# Patient Record
Sex: Male | Born: 1970 | Race: White | Hispanic: Yes | Marital: Married | Smoking: Never smoker
Health system: Southern US, Community
[De-identification: ages and names within clinical notes are randomized; demographics above are authoritative.]

## PROBLEM LIST (undated history)

## (undated) DIAGNOSIS — I10 Essential (primary) hypertension: Secondary | ICD-10-CM

## (undated) DIAGNOSIS — E669 Obesity, unspecified: Secondary | ICD-10-CM

## (undated) DIAGNOSIS — E785 Hyperlipidemia, unspecified: Secondary | ICD-10-CM

## (undated) DIAGNOSIS — E119 Type 2 diabetes mellitus without complications: Secondary | ICD-10-CM

## (undated) HISTORY — DX: Essential (primary) hypertension: I10

## (undated) HISTORY — PX: OTHER SURGICAL HISTORY: SHX169

## (undated) HISTORY — PX: NO PAST SURGERIES: SHX2092

## (undated) HISTORY — DX: Hyperlipidemia, unspecified: E78.5

---

## 2003-10-25 ENCOUNTER — Emergency Department (HOSPITAL_COMMUNITY): Admission: EM | Admit: 2003-10-25 | Discharge: 2003-10-25 | Payer: Self-pay | Admitting: Emergency Medicine

## 2017-05-12 ENCOUNTER — Emergency Department (HOSPITAL_COMMUNITY): Payer: Self-pay

## 2017-05-12 ENCOUNTER — Emergency Department (HOSPITAL_COMMUNITY)
Admission: EM | Admit: 2017-05-12 | Discharge: 2017-05-12 | Disposition: A | Discharge status: Home / Self Care | Payer: Self-pay | Attending: Emergency Medicine | Admitting: Emergency Medicine

## 2017-05-12 ENCOUNTER — Encounter (HOSPITAL_COMMUNITY): Payer: Self-pay | Admitting: *Deleted

## 2017-05-12 ENCOUNTER — Other Ambulatory Visit: Payer: Self-pay

## 2017-05-12 DIAGNOSIS — R0789 Other chest pain: Secondary | ICD-10-CM | POA: Insufficient documentation

## 2017-05-12 HISTORY — DX: Obesity, unspecified: E66.9

## 2017-05-12 MED ORDER — NAPROXEN 500 MG PO TABS: 500.0000 mg | ORAL_TABLET | Freq: Two times a day (BID) | ORAL | 0 refills | Status: DC

## 2017-05-12 NOTE — ED Provider Notes (Signed)
Patient placed in Quick Look pathway, seen and evaluated for chief complaint of left chest wall pain.  Pertinent H&P findings include mechanical injury with hugging. Felt pop. Mild SOB. No hx ACS. Based on initial evaluation, labs are not indicated and radiology studies are indicated.  Patient counseled on process, plan, and necessity for staying for completing the evaluation.    Audry PiliMohr, Maddy Graham, PA-C 05/12/17 1715    Margarita Grizzleay, Danielle, MD 05/15/17 [phone]

## 2017-05-12 NOTE — ED Notes (Signed)
ED Provider at bedside. 

## 2017-05-12 NOTE — Discharge Instructions (Signed)
Please read attached information regarding your condition. Take naproxen twice daily to help with pain and inflammation. Apply heating pad to affected area and stretch as tolerated. Return to ED for worsening symptoms, chest pain, trouble breathing, coughing or vomiting up blood.   Por favor lea la informacin adjunta sobre su condicin. Tome Lucent Technologiesnaproxeno dos veces al da para ayudar con el dolor y la inflamacin. Aplique almohadilla trmica al rea afectada y estrelo segn lo tolere Regrese a la ED para FedExempeorar los sntomas, dolor en el pecho, dificultad para Industrial/product designerrespirar, toser o Diplomatic Services operational officervomitar sangre.

## 2017-05-12 NOTE — ED Triage Notes (Signed)
Pt c/o L sided rib cage pain with deep inspirations onset x 2 wks after his friend hugged him, pt states, "I heard my ribs pop when he hugged me." pain worse with lifting, pt denies n/v/d, A&O x4

## 2017-05-12 NOTE — ED Notes (Addendum)
Pt native language is [place]. Spanish interpreter used for communication.

## 2017-05-12 NOTE — ED Notes (Signed)
Signature pad malfunction at time of departure. Pt verbalized understanding of instructions and prescription.

## 2017-05-12 NOTE — ED Provider Notes (Signed)
[facility]   CSN: [id number] Arrival date & time: 05/12/17  1640     History   Chief Complaint Chief Complaint  Patient presents with  . Chest Pain    HPI [facility] is a 47 y.o. male with no significant past medical history, who presents to ED for evaluation of left sided rib pain for the past 5 hours.  States that symptoms began after 1 of his friends hugged him with a lot of force and picked him up.  He felt a popping sensation on his left rib area and has been having intermittent sharp pains worse with movement since then.  Patient.  He just wants to make sure that he has not broken any ribs or bones otherwise.  He has not taken any medications prior to arrival to help with symptoms.  He denies any shortness of breath, hemoptysis, recent surgeries, recent prolonged travel, prior MI, DVT or PE, chest pain, vomiting, diaphoresis, abdominal pain or prior rib fractures.  HPI  Past Medical History:  Diagnosis Date  . Obese     There are no active problems to display for this patient.   History reviewed. No pertinent surgical history.     Home Medications    Prior to Admission medications   Medication Sig Start Date End Date Taking? Authorizing Provider  naproxen (NAPROSYN) 500 MG tablet Take 1 tablet (500 mg total) by mouth 2 (two) times daily. 05/12/17   Dietrich PatesKhatri, Lucien Budney, PA-C    Family History No family history on file.  Social History Social History   Tobacco Use  . Smoking status: Never Smoker  . Smokeless tobacco: Never Used  Substance Use Topics  . Alcohol use: No    Frequency: Never  . Drug use: No     Allergies   Patient has no known allergies.   Review of Systems Review of Systems  Constitutional: Negative for chills and fever.  Respiratory: Negative for cough, chest tightness, shortness of breath and wheezing.   Cardiovascular: Negative for chest pain, palpitations and leg  swelling.  Gastrointestinal: Negative for nausea and vomiting.  Musculoskeletal: Positive for arthralgias and myalgias. Negative for back pain, gait problem and joint swelling.  Skin: Negative for rash and wound.     Physical Exam Updated Vital Signs BP 124/85 (BP Location: Right Arm)   Pulse 85   Temp 99.2 F (37.3 C) (Oral)   Resp 20   SpO2 93%   Physical Exam  Constitutional: He appears well-developed and well-nourished. No distress.  Nontoxic-appearing and in no acute distress.  HENT:  Head: Normocephalic and atraumatic.  Eyes: Conjunctivae and EOM are normal. No scleral icterus.  Neck: Normal range of motion.  Cardiovascular: Normal rate, regular rhythm and normal heart sounds.  Pulmonary/Chest: Effort normal. No respiratory distress.  Left-sided rib pain worse with palpation.    Abdominal: Soft. Bowel sounds are normal. He exhibits no distension. There is no tenderness.  Neurological: He is alert.  Skin: No rash noted. He is not diaphoretic.  Psychiatric: He has a normal mood and affect.  Nursing note and vitals reviewed.    ED Treatments / Results  Labs (all labs ordered are listed, but only abnormal results are displayed) Labs Reviewed - No data to display  EKG  EKG Interpretation None       Radiology Dg Chest 2 View  Result Date: 05/12/2017 CLINICAL DATA:  Left-sided rib cage pain with deep inspirations for 2 weeks.  Heard ribs pop. Pain is worse with lifting. EXAM: CHEST  2 VIEW COMPARISON:  10/25/2003 FINDINGS: Mild cardiac enlargement. No edema or consolidation. No blunting of costophrenic angles. No pneumothorax. Visualized ribs are grossly intact. Mediastinal contours appear intact. Soft tissues are unremarkable. IMPRESSION: Cardiac enlargement.  No evidence of active pulmonary disease. Electronically Signed   By: Burman Nieves M.D.   On: 05/12/2017 18:03    Procedures Procedures (including critical care time)  Medications Ordered in  ED Medications - No data to display   Initial Impression / Assessment and Plan / ED Course  I have reviewed the triage vital signs and the nursing notes.  Pertinent labs & imaging results that were available during my care of the patient were reviewed by me and considered in my medical decision making (see chart for details).     Patient, with no significant past medical history, who presents to ED for evaluation of left-sided low that began just prior to arrival.  Symptoms began after 1 of his friends hugged him tight and picked him up.  States that he felt a pop in the area and has been having pain worse with palpation and movement since then.  He has no prior cardiac history, recent surgeries, recent prolonged travel, history of DVT PE, prior rib fractures, hematemesis. On physical exam he is overall well appearing. There is TTP as indicated in image above. I have low suspicion for cardiac or pulmonary cause of his symptoms.  Suspect musculoskeletal pain as this is worse with palpation and the patient is overall cardiac history.  His x-ray was negative.  Patient states that he just wants to make sure he did not break any ribs.  Will give supportive treatment with anti-inflammatories, heat therapy and stretching as advised him to follow-up with wellness center for further evaluation.  Patient appears stable for discharge at this time.  Strict return precautions given.  Final Clinical Impressions(s) / ED Diagnoses   Final diagnoses:  Chest wall pain    ED Discharge Orders        Ordered    naproxen (NAPROSYN) 500 MG tablet  2 times daily     05/12/17 2016     Portions of this note were generated with [employer]. Dictation errors may occur despite best attempts at proofreading.    Dietrich Pates, PA-C 05/12/17 2023    Mancel Bale, MD 05/13/17 [phone]

## 2018-10-09 ENCOUNTER — Other Ambulatory Visit: Payer: Self-pay

## 2018-10-09 ENCOUNTER — Encounter (HOSPITAL_COMMUNITY): Payer: Self-pay | Admitting: Emergency Medicine

## 2018-10-09 ENCOUNTER — Inpatient Hospital Stay (HOSPITAL_COMMUNITY)
Admission: EM | Admit: 2018-10-09 | Discharge: 2018-10-22 | DRG: 207 | Disposition: A | Discharge status: Home / Self Care | Payer: Medicaid Other | Attending: Internal Medicine | Admitting: Internal Medicine

## 2018-10-09 ENCOUNTER — Emergency Department (HOSPITAL_COMMUNITY): Payer: Medicaid Other

## 2018-10-09 DIAGNOSIS — R6521 Severe sepsis with septic shock: Secondary | ICD-10-CM | POA: Diagnosis present

## 2018-10-09 DIAGNOSIS — N179 Acute kidney failure, unspecified: Secondary | ICD-10-CM | POA: Diagnosis not present

## 2018-10-09 DIAGNOSIS — J988 Other specified respiratory disorders: Secondary | ICD-10-CM

## 2018-10-09 DIAGNOSIS — J1289 Other viral pneumonia: Secondary | ICD-10-CM | POA: Diagnosis not present

## 2018-10-09 DIAGNOSIS — I952 Hypotension due to drugs: Secondary | ICD-10-CM | POA: Diagnosis not present

## 2018-10-09 DIAGNOSIS — Z6841 Body Mass Index (BMI) 40.0 and over, adult: Secondary | ICD-10-CM | POA: Diagnosis not present

## 2018-10-09 DIAGNOSIS — T4275XA Adverse effect of unspecified antiepileptic and sedative-hypnotic drugs, initial encounter: Secondary | ICD-10-CM | POA: Diagnosis not present

## 2018-10-09 DIAGNOSIS — G4733 Obstructive sleep apnea (adult) (pediatric): Secondary | ICD-10-CM | POA: Diagnosis not present

## 2018-10-09 DIAGNOSIS — J9601 Acute respiratory failure with hypoxia: Secondary | ICD-10-CM | POA: Diagnosis not present

## 2018-10-09 DIAGNOSIS — K7290 Hepatic failure, unspecified without coma: Secondary | ICD-10-CM | POA: Diagnosis present

## 2018-10-09 DIAGNOSIS — Z452 Encounter for adjustment and management of vascular access device: Secondary | ICD-10-CM

## 2018-10-09 DIAGNOSIS — E870 Hyperosmolality and hypernatremia: Secondary | ICD-10-CM | POA: Diagnosis not present

## 2018-10-09 DIAGNOSIS — A4189 Other specified sepsis: Secondary | ICD-10-CM | POA: Diagnosis not present

## 2018-10-09 DIAGNOSIS — J9602 Acute respiratory failure with hypercapnia: Secondary | ICD-10-CM | POA: Diagnosis not present

## 2018-10-09 DIAGNOSIS — J80 Acute respiratory distress syndrome: Secondary | ICD-10-CM | POA: Diagnosis present

## 2018-10-09 DIAGNOSIS — J9691 Respiratory failure, unspecified with hypoxia: Secondary | ICD-10-CM

## 2018-10-09 DIAGNOSIS — E1165 Type 2 diabetes mellitus with hyperglycemia: Secondary | ICD-10-CM | POA: Diagnosis present

## 2018-10-09 DIAGNOSIS — E871 Hypo-osmolality and hyponatremia: Secondary | ICD-10-CM | POA: Diagnosis not present

## 2018-10-09 DIAGNOSIS — U071 COVID-19: Secondary | ICD-10-CM | POA: Diagnosis not present

## 2018-10-09 DIAGNOSIS — I10 Essential (primary) hypertension: Secondary | ICD-10-CM | POA: Diagnosis not present

## 2018-10-09 DIAGNOSIS — Z4659 Encounter for fitting and adjustment of other gastrointestinal appliance and device: Secondary | ICD-10-CM

## 2018-10-09 DIAGNOSIS — L89152 Pressure ulcer of sacral region, stage 2: Secondary | ICD-10-CM | POA: Diagnosis not present

## 2018-10-09 DIAGNOSIS — E1169 Type 2 diabetes mellitus with other specified complication: Secondary | ICD-10-CM

## 2018-10-09 DIAGNOSIS — E781 Pure hyperglyceridemia: Secondary | ICD-10-CM | POA: Diagnosis present

## 2018-10-09 DIAGNOSIS — L8990 Pressure ulcer of unspecified site, unspecified stage: Secondary | ICD-10-CM

## 2018-10-09 DIAGNOSIS — R0602 Shortness of breath: Secondary | ICD-10-CM | POA: Diagnosis present

## 2018-10-09 HISTORY — DX: Type 2 diabetes mellitus without complications: E11.9

## 2018-10-09 LAB — CBC WITH DIFFERENTIAL/PLATELET
Abs Immature Granulocytes: 0.56 10*3/uL — ABNORMAL HIGH (ref 0.00–0.07)
Abs Immature Granulocytes: 0.44 10*3/uL — ABNORMAL HIGH (ref 0.00–0.07)
Basophils Absolute: 0 10*3/uL (ref 0.0–0.1)
Basophils Absolute: 0.1 10*3/uL (ref 0.0–0.1)
Basophils Relative: 0 %
Basophils Relative: 0 %
Eosinophils Absolute: 0 10*3/uL (ref 0.0–0.5)
Eosinophils Absolute: 0 10*3/uL (ref 0.0–0.5)
Eosinophils Relative: 0 %
Eosinophils Relative: 0 %
HCT: 44.3 % (ref 39.0–52.0)
HCT: 43.3 % (ref 39.0–52.0)
Hemoglobin: 15.3 g/dL (ref 13.0–17.0)
Hemoglobin: 14.9 g/dL (ref 13.0–17.0)
Immature Granulocytes: 4 %
Immature Granulocytes: 3 %
Lymphocytes Relative: 13 %
Lymphocytes Relative: 13 %
Lymphs Abs: 1.7 10*3/uL (ref 0.7–4.0)
Lymphs Abs: 1.8 10*3/uL (ref 0.7–4.0)
MCH: 29.6 pg (ref 26.0–34.0)
MCH: 30 pg (ref 26.0–34.0)
MCHC: 34.5 g/dL (ref 30.0–36.0)
MCHC: 34.4 g/dL (ref 30.0–36.0)
MCV: 85.7 fL (ref 80.0–100.0)
MCV: 87.3 fL (ref 80.0–100.0)
Monocytes Absolute: 0.9 10*3/uL (ref 0.1–1.0)
Monocytes Absolute: 0.8 10*3/uL (ref 0.1–1.0)
Monocytes Relative: 7 %
Monocytes Relative: 6 %
Neutro Abs: 10 10*3/uL — ABNORMAL HIGH (ref 1.7–7.7)
Neutro Abs: 10.6 10*3/uL — ABNORMAL HIGH (ref 1.7–7.7)
Neutrophils Relative %: 76 %
Neutrophils Relative %: 78 %
Platelets: 249 10*3/uL (ref 150–400)
Platelets: 230 10*3/uL (ref 150–400)
RBC: 5.17 MIL/uL (ref 4.22–5.81)
RBC: 4.96 MIL/uL (ref 4.22–5.81)
RDW: 12.6 % (ref 11.5–15.5)
RDW: 12.8 % (ref 11.5–15.5)
WBC: 13.2 10*3/uL — ABNORMAL HIGH (ref 4.0–10.5)
WBC: 13.7 10*3/uL — ABNORMAL HIGH (ref 4.0–10.5)
nRBC: 0.9 % — ABNORMAL HIGH (ref 0.0–0.2)
nRBC: 0.7 % — ABNORMAL HIGH (ref 0.0–0.2)

## 2018-10-09 LAB — COMPREHENSIVE METABOLIC PANEL
ALT: 564 U/L — ABNORMAL HIGH (ref 0–44)
AST: 1510 U/L — ABNORMAL HIGH (ref 15–41)
Albumin: 2.4 g/dL — ABNORMAL LOW (ref 3.5–5.0)
Alkaline Phosphatase: 62 U/L (ref 38–126)
Anion gap: 16 — ABNORMAL HIGH (ref 5–15)
BUN: 34 mg/dL — ABNORMAL HIGH (ref 6–20)
CO2: 21 mmol/L — ABNORMAL LOW (ref 22–32)
Calcium: 7.5 mg/dL — ABNORMAL LOW (ref 8.9–10.3)
Chloride: 86 mmol/L — ABNORMAL LOW (ref 98–111)
Creatinine, Ser: 2 mg/dL — ABNORMAL HIGH (ref 0.61–1.24)
GFR calc Af Amer: 45 mL/min — ABNORMAL LOW (ref >60)
GFR calc non Af Amer: 39 mL/min — ABNORMAL LOW (ref >60)
Glucose, Bld: 319 mg/dL — ABNORMAL HIGH (ref 70–99)
Potassium: 4.6 mmol/L (ref 3.5–5.1)
Sodium: 123 mmol/L — ABNORMAL LOW (ref 135–145)
Total Bilirubin: 1.1 mg/dL (ref 0.3–1.2)
Total Protein: 6.9 g/dL (ref 6.5–8.1)

## 2018-10-09 LAB — POCT I-STAT 7, (LYTES, BLD GAS, ICA,H+H)
Acid-Base Excess: 1 mmol/L (ref 0.0–2.0)
Bicarbonate: 26.8 mmol/L (ref 20.0–28.0)
Calcium, Ion: 1.03 mmol/L — ABNORMAL LOW (ref 1.15–1.40)
HCT: 43 % (ref 39.0–52.0)
Hemoglobin: 14.6 g/dL (ref 13.0–17.0)
O2 Saturation: 89 %
Potassium: 4.5 mmol/L (ref 3.5–5.1)
Sodium: 122 mmol/L — ABNORMAL LOW (ref 135–145)
TCO2: 28 mmol/L (ref 22–32)
pCO2 arterial: 45.1 mmHg (ref 32.0–48.0)
pH, Arterial: 7.382 (ref 7.350–7.450)
pO2, Arterial: 58 mmHg — ABNORMAL LOW (ref 83.0–108.0)

## 2018-10-09 LAB — PHOSPHORUS: Phosphorus: 4.3 mg/dL (ref 2.5–4.6)

## 2018-10-09 LAB — MAGNESIUM: Magnesium: 2 mg/dL (ref 1.7–2.4)

## 2018-10-09 LAB — D-DIMER, QUANTITATIVE: D-Dimer, Quant: 5.21 ug/mL-FEU — ABNORMAL HIGH (ref 0.00–0.50)

## 2018-10-09 LAB — FERRITIN: Ferritin: 6924 ng/mL — ABNORMAL HIGH (ref 24–336)

## 2018-10-09 LAB — LACTIC ACID, PLASMA
Lactic Acid, Venous: 4.2 mmol/L (ref 0.5–1.9)
Lactic Acid, Venous: 2.5 mmol/L (ref 0.5–1.9)

## 2018-10-09 LAB — SARS CORONAVIRUS 2 BY RT PCR (HOSPITAL ORDER, PERFORMED IN ~~LOC~~ HOSPITAL LAB): SARS Coronavirus 2: POSITIVE — AB

## 2018-10-09 LAB — C-REACTIVE PROTEIN: CRP: 28.4 mg/dL — ABNORMAL HIGH (ref <1.0)

## 2018-10-09 MED ORDER — SODIUM CHLORIDE 0.9 % IV SOLN
1.0000 mg/kg | Freq: Once | INTRAVENOUS | Status: AC
  Administered 2018-10-10: 04:00:00 150 mg via INTRAVENOUS
  Filled 2018-10-09: qty 2.4

## 2018-10-09 MED ORDER — VANCOMYCIN HCL 10 G IV SOLR
1750.0000 mg | INTRAVENOUS | Status: DC
  Administered 2018-10-10: 20:00:00 1750 mg via INTRAVENOUS
  Filled 2018-10-09 (×2): qty 1750

## 2018-10-09 MED ORDER — ENOXAPARIN SODIUM 40 MG/0.4ML ~~LOC~~ SOLN: 40.0000 mg | SUBCUTANEOUS | Status: DC

## 2018-10-09 MED ORDER — FENTANYL CITRATE (PF) 100 MCG/2ML IJ SOLN: 50.0000 ug | Freq: Once | INTRAMUSCULAR | Status: DC

## 2018-10-09 MED ORDER — PROPOFOL 1000 MG/100ML IV EMUL
5.0000 ug/kg/min | INTRAVENOUS | Status: DC
  Administered 2018-10-09: 5 ug/kg/min via INTRAVENOUS
  Filled 2018-10-09: qty 100

## 2018-10-09 MED ORDER — MIDAZOLAM HCL 2 MG/2ML IJ SOLN
2.0000 mg | Freq: Once | INTRAMUSCULAR | Status: DC
  Filled 2018-10-09 (×2): qty 2

## 2018-10-09 MED ORDER — INSULIN ASPART 100 UNIT/ML ~~LOC~~ SOLN
0.0000 [IU] | SUBCUTANEOUS | Status: DC
  Administered 2018-10-10: 20 [IU] via SUBCUTANEOUS

## 2018-10-09 MED ORDER — VANCOMYCIN HCL 10 G IV SOLR
1500.0000 mg | Freq: Once | INTRAVENOUS | Status: AC
  Administered 2018-10-09: 1500 mg via INTRAVENOUS
  Filled 2018-10-09: qty 1500

## 2018-10-09 MED ORDER — PROPOFOL 1000 MG/100ML IV EMUL
INTRAVENOUS | Status: AC
  Administered 2018-10-11: 30 ug/kg/min via INTRAVENOUS
  Filled 2018-10-09: qty 100

## 2018-10-09 MED ORDER — SODIUM CHLORIDE 0.9 % IV SOLN
2.0000 g | Freq: Once | INTRAVENOUS | Status: AC
  Administered 2018-10-09: 18:00:00 2 g via INTRAVENOUS
  Filled 2018-10-09: qty 2

## 2018-10-09 MED ORDER — FENTANYL 2500MCG IN NS 250ML (10MCG/ML) PREMIX INFUSION
50.0000 ug/h | INTRAVENOUS | Status: DC
  Administered 2018-10-10 (×2): 200 ug/h via INTRAVENOUS
  Administered 2018-10-11: 150 ug/h via INTRAVENOUS
  Administered 2018-10-11 – 2018-10-12 (×2): 200 ug/h via INTRAVENOUS
  Filled 2018-10-09 (×4): qty 250

## 2018-10-09 MED ORDER — FENTANYL BOLUS VIA INFUSION
50.0000 ug | INTRAVENOUS | Status: DC | PRN
  Administered 2018-10-11 – 2018-10-12 (×4): 50 ug via INTRAVENOUS
  Filled 2018-10-09: qty 50

## 2018-10-09 MED ORDER — METHYLPREDNISOLONE SODIUM SUCC 125 MG IJ SOLR
60.0000 mg | Freq: Three times a day (TID) | INTRAMUSCULAR | Status: DC
  Administered 2018-10-10 – 2018-10-12 (×7): 60 mg via INTRAVENOUS
  Filled 2018-10-09 (×7): qty 2

## 2018-10-09 MED ORDER — ENOXAPARIN SODIUM 40 MG/0.4ML ~~LOC~~ SOLN: 40.0000 mg | Freq: Two times a day (BID) | SUBCUTANEOUS | Status: DC

## 2018-10-09 MED ORDER — TOCILIZUMAB 400 MG/20ML IV SOLN: 4.0000 mg/kg | Freq: Once | INTRAVENOUS | Status: DC

## 2018-10-09 MED ORDER — ENOXAPARIN SODIUM 80 MG/0.8ML ~~LOC~~ SOLN
75.0000 mg | Freq: Two times a day (BID) | SUBCUTANEOUS | Status: DC
  Administered 2018-10-10 – 2018-10-22 (×24): 75 mg via SUBCUTANEOUS
  Filled 2018-10-09 (×6): qty 0.8
  Filled 2018-10-09: qty 0.75
  Filled 2018-10-09 (×4): qty 0.8
  Filled 2018-10-09: qty 0.75
  Filled 2018-10-09 (×12): qty 0.8
  Filled 2018-10-09: qty 0.75
  Filled 2018-10-09 (×2): qty 0.8

## 2018-10-09 MED ORDER — FENTANYL CITRATE (PF) 100 MCG/2ML IJ SOLN: 50.0000 ug | INTRAMUSCULAR | Status: DC | PRN

## 2018-10-09 MED ORDER — ROCURONIUM BROMIDE 50 MG/5ML IV SOLN
INTRAVENOUS | Status: AC | PRN
  Administered 2018-10-09: 100 mg via INTRAVENOUS

## 2018-10-09 MED ORDER — NOREPINEPHRINE 4 MG/250ML-% IV SOLN
0.0000 ug/min | INTRAVENOUS | Status: DC
Start: 2018-10-09 — End: 2018-10-16
  Administered 2018-10-09: 2 ug/min via INTRAVENOUS
  Administered 2018-10-10: 13 ug/min via INTRAVENOUS
  Filled 2018-10-09: qty 250

## 2018-10-09 MED ORDER — SODIUM CHLORIDE 0.9 % IV SOLN
2.0000 g | Freq: Three times a day (TID) | INTRAVENOUS | Status: AC
  Administered 2018-10-10 – 2018-10-13 (×12): 2 g via INTRAVENOUS
  Filled 2018-10-09 (×12): qty 2

## 2018-10-09 MED ORDER — PROPOFOL 1000 MG/100ML IV EMUL
0.0000 ug/kg/min | INTRAVENOUS | Status: DC
  Administered 2018-10-09: 40 ug/kg/min via INTRAVENOUS
  Administered 2018-10-10 (×4): 50 ug/kg/min via INTRAVENOUS
  Administered 2018-10-10: 40 ug/kg/min via INTRAVENOUS
  Administered 2018-10-10 (×5): 50 ug/kg/min via INTRAVENOUS
  Administered 2018-10-11: 30 ug/kg/min via INTRAVENOUS
  Administered 2018-10-11: 20 ug/kg/min via INTRAVENOUS
  Administered 2018-10-11: 25 ug/kg/min via INTRAVENOUS
  Filled 2018-10-09 (×11): qty 100
  Filled 2018-10-09: qty 200
  Filled 2018-10-09: qty 100

## 2018-10-09 MED ORDER — SUCCINYLCHOLINE CHLORIDE 20 MG/ML IJ SOLN
INTRAMUSCULAR | Status: AC | PRN
  Administered 2018-10-09: 100 mg via INTRAVENOUS

## 2018-10-09 MED ORDER — INSULIN DETEMIR 100 UNIT/ML ~~LOC~~ SOLN
0.0750 [IU]/kg | Freq: Two times a day (BID) | SUBCUTANEOUS | Status: DC
  Administered 2018-10-10: 12 [IU] via SUBCUTANEOUS
  Filled 2018-10-09: qty 0.12

## 2018-10-09 MED ORDER — PROPOFOL 10 MG/ML IV BOLUS
INTRAVENOUS | Status: AC
  Filled 2018-10-09: qty 20

## 2018-10-09 MED ORDER — MIDAZOLAM HCL 2 MG/2ML IJ SOLN
2.0000 mg | Freq: Once | INTRAMUSCULAR | Status: DC
  Filled 2018-10-09: qty 2

## 2018-10-09 MED ORDER — PANTOPRAZOLE SODIUM 40 MG IV SOLR
40.0000 mg | Freq: Every day | INTRAVENOUS | Status: DC
  Administered 2018-10-10 – 2018-10-14 (×5): 40 mg via INTRAVENOUS
  Filled 2018-10-09 (×5): qty 40

## 2018-10-09 MED ORDER — ETOMIDATE 2 MG/ML IV SOLN
INTRAVENOUS | Status: AC | PRN
  Administered 2018-10-09: 10 mg via INTRAVENOUS

## 2018-10-09 MED ORDER — SODIUM CHLORIDE 0.9 % IV SOLN
1.0000 mg/kg | Freq: Every day | INTRAVENOUS | Status: DC
  Filled 2018-10-09: qty 1.2

## 2018-10-09 MED ORDER — FENTANYL 2500MCG IN NS 250ML (10MCG/ML) PREMIX INFUSION
50.0000 ug/h | INTRAVENOUS | Status: DC
  Administered 2018-10-09: 50 ug/h via INTRAVENOUS
  Filled 2018-10-09: qty 250

## 2018-10-09 NOTE — ED Notes (Signed)
Wife updated, [phone]

## 2018-10-09 NOTE — ED Notes (Signed)
Propofol infusion and midazolam 2 mg given to [facility] for administration.

## 2018-10-09 NOTE — ED Notes (Signed)
Wife updated once more about patient's wife about transport to [facility]. Wife only speaks spanish.

## 2018-10-09 NOTE — ED Triage Notes (Signed)
Pt BIB [facility] from home. Per family, pt has been weak and lethargic for 3 days. Upon EMS arrival, pt SPO2 35% RA. EMS then put pt on NR and got SPO2 up to 75% and then put pt on CPAP and SPO2 stayed around 70%. Pt has not had any fevers, EMS ascultated fluid in all 4 quadrants.

## 2018-10-09 NOTE — ED Provider Notes (Addendum)
[facility]   CSN: [id number] Arrival date & time: 10/09/18  1702    History   Chief Complaint Chief Complaint  Patient presents with  . Shortness of Breath    [facility] Roy Andrews is a 48 y.o. male.     HPI  Level 5 caveat secondary to acuity of condition, language barrier, and patient's confusion Attempted to obtain history through interpreter x2.  Due to patient's high flow oxygen it was difficult for him to hear.  When Roy Andrews was able to hear, Roy Andrews appeared to be confused and was not able to appropriately answer questions 48 year old male who presents from home via EMS with reports that Roy Andrews has been increasingly weak and confused for the past several days.  EMS reports that patient was hypoxic on their arrival.  Roy Andrews had been placed on CPAP prior to the ED.  Sats were in the low 70s.  They report tachycardia and some borderline hypotension.  Due to language barrier at the scene no further history was obtained.  '    Past Medical History:  Diagnosis Date  . Diabetes mellitus without complication (HCC)   . Obese     Patient Active Problem List   Diagnosis Date Noted  . COVID-19 10/09/2018    History reviewed. No pertinent surgical history.      Home Medications    Prior to Admission medications   Medication Sig Start Date End Date Taking? Authorizing Provider  naproxen (NAPROSYN) 500 MG tablet Take 1 tablet (500 mg total) by mouth 2 (two) times daily. 05/12/17   Dietrich PatesKhatri, Hina, PA-C    Family History No family history on file.  Social History Social History   Tobacco Use  . Smoking status: Never Smoker  . Smokeless tobacco: Never Used  Substance Use Topics  . Alcohol use: No    Frequency: Never  . Drug use: No     Allergies   Patient has no known allergies.   Review of Systems Review of Systems   Physical Exam Updated Vital Signs BP (!) 179/128 (BP Location: Right Arm)   Pulse (!) 128    Temp (!) 100.4 F (38 C) (Oral)   Resp (!) 24   Ht 1.803 m (5\' 11" )   SpO2 91%   Physical Exam Vitals signs and nursing note reviewed.  Constitutional:      Appearance: Roy Andrews is obese.     Comments: Morbidly obese male who appears to be in some distress  HENT:     Head: Normocephalic.     Mouth/Throat:     Mouth: Mucous membranes are moist.  Eyes:     Pupils: Pupils are equal, round, and reactive to light.  Neck:     Musculoskeletal: Normal range of motion.  Cardiovascular:     Rate and Rhythm: Tachycardia present.     Pulses: Normal pulses.  Pulmonary:     Effort: Respiratory distress present.  Abdominal:     Palpations: Abdomen is soft.  Musculoskeletal: Normal range of motion.  Skin:    General: Skin is warm.     Capillary Refill: Capillary refill takes less than 2 seconds.  Neurological:     General: No focal deficit present.     Mental Status: Roy Andrews is alert.      ED Treatments / Results  Labs (all labs ordered are listed, but only abnormal results are displayed) Labs Reviewed  SARS CORONAVIRUS 2 (HOSPITAL ORDER, PERFORMED IN [facility]) - Abnormal; Notable for the following components:      Result Value   SARS Coronavirus 2 POSITIVE (*)    All other components within normal limits  LACTIC ACID, PLASMA - Abnormal; Notable for the following components:   Lactic Acid, Venous 4.2 (*)    All other components within normal limits  CBC WITH DIFFERENTIAL/PLATELET - Abnormal; Notable for the following components:   WBC 13.2 (*)    nRBC 0.9 (*)    Neutro Abs 10.0 (*)    Abs Immature Granulocytes 0.56 (*)    All other components within normal limits  CULTURE, BLOOD (ROUTINE X 2)  CULTURE, BLOOD (ROUTINE X 2)  SARS CORONAVIRUS 2 (HOSPITAL ORDER, PERFORMED IN [place] [facility])  SARS CORONAVIRUS 2 (HOSPITAL ORDER, PERFORMED IN [place] [facility])  LACTIC ACID, PLASMA  D-DIMER, QUANTITATIVE (NOT AT [facility])  PROCALCITONIN  FIBRINOGEN  HIV  ANTIBODY (ROUTINE TESTING W REFLEX)  BLOOD GAS, ARTERIAL  CBC WITH DIFFERENTIAL/PLATELET  COMPREHENSIVE METABOLIC PANEL  C-REACTIVE PROTEIN  CK  D-DIMER, QUANTITATIVE (NOT AT [facility])  FIBRIN DERIVATIVES D-DIMER ([facility] ONLY)  FERRITIN  INTERLEUKIN-6, PLASMA  MAGNESIUM  PHOSPHORUS  TRIGLYCERIDES  I-STAT ARTERIAL BLOOD GAS, ED  ABO/RH    EKG None  Radiology Dg Chest Port 1 View  Result Date: 10/09/2018 CLINICAL DATA:  Respiratory failure. EXAM: PORTABLE CHEST 1 VIEW COMPARISON:  Chest x-Aitana Burry 05/12/2017 FINDINGS: The endotracheal tube is approximately 4.6 cm above the carina. The heart is mildly enlarged in the mediastinal and hilar contours are prominent. Some of this is due to the AP projection and the supine position of the patient. Significant bilateral airspace process, likely diffuse pneumonia. No definite pleural effusions. The bony thorax is grossly intact. IMPRESSION: 1. The ET tube is 4.6 cm above the carina. 2. Significant bilateral airspace process, right slightly greater than left. Electronically Signed   By: Rudie Meyer M.D.   On: 10/09/2018 18:45    Procedures Date/Time: 10/09/2018 7:42 PM Performed by: Margarita Grizzle, MD Pre-anesthesia Checklist: Patient identified, Emergency Drugs available, Suction available, Patient being monitored and Timeout performed Oxygen Delivery Method: Non-rebreather mask Preoxygenation: Pre-oxygenation with 100% oxygen Induction Type: Rapid sequence Ventilation: Mask ventilation without difficulty Laryngoscope Size: Glidescope and 4 Tube size: 8.0 mm Number of attempts: 1 Airway Equipment and Method: Patient positioned with wedge pillow and Video-laryngoscopy Placement Confirmation: ETT inserted through vocal cords under direct vision,  Positive ETCO2,  CO2 detector and Breath sounds checked- equal and bilateral Tube secured with: ETT holder Dental Injury: Teeth and Oropharynx as per pre-operative assessment     .Critical Care Performed  by: Margarita Grizzle, MD Authorized by: Margarita Grizzle, MD   Critical care provider statement:    Critical care time (minutes):  75   Critical care end time:  10/09/2018 7:43 PM   Critical care was necessary to treat or prevent imminent or life-threatening deterioration of the following conditions:  Respiratory failure   Critical care was time spent personally by me on the following activities:  Discussions with consultants, evaluation of patient's response to treatment, examination of patient, ordering and performing treatments and interventions, ordering and review of laboratory studies, ordering and review of radiographic studies, pulse oximetry, re-evaluation of patient's condition, obtaining history from patient or surrogate and review of old charts   (including critical care time)  Medications Ordered in ED Medications  propofol (DIPRIVAN) 1000 MG/100ML infusion (has no administration in time range)  norepinephrine (LEVOPHED)  in premix infusion (has no  administration in time range)  vancomycin (VANCOCIN) 1,500 mg in sodium chloride 0.9 % 500 mL IVPB (has no administration in time range)  enoxaparin (LOVENOX) injection 40 mg (has no administration in time range)  insulin aspart (novoLOG) injection 0-20 Units (has no administration in time range)  insulin detemir (LEVEMIR) injection 0.075 Units/kg (has no administration in time range)  fentaNYL in NS (73mcg/ml) infusion-PREMIX (has no administration in time range)  fentaNYL (SUBLIMAZE) injection 50 mcg (has no administration in time range)  propofol (DIPRIVAN) 1000 MG/100ML infusion (has no administration in time range)  ceFEPIme (MAXIPIME) 2 g in sodium chloride 0.9 % 100 mL IVPB (2 g Intravenous New Bag/Given 10/09/18 1758)  etomidate (AMIDATE) injection (10 mg Intravenous Given 10/09/18 1839)  succinylcholine (ANECTINE) injection (100 mg Intravenous Given 10/09/18 1840)  rocuronium (ZEMURON) injection (100 mg Intravenous  Given 10/09/18 1840)     Initial Impression / Assessment and Plan / ED Course  I have reviewed the triage vital signs and the nursing notes.  Pertinent labs & imaging results that were available during my care of the patient were reviewed by me and considered in my medical decision making (see chart for details).    [facility] Bess Kinds was evaluated in Emergency Department on 10/09/2018 for the symptoms described in the history of present illness. Roy Andrews was evaluated in the context of the global COVID-19 pandemic, which necessitated consideration that the patient might be at risk for infection with the SARS-CoV-2 virus that causes COVID-19. Institutional protocols and algorithms that pertain to the evaluation of patients at risk for COVID-19 are in a state of rapid change based on information released by regulatory bodies including the CDC and federal and state organizations. These policies and algorithms were followed during the patient's care in the ED.     Medical care consulted and assumed care of patient prior to return of all of labs. On my review patient has a leukocytosis of 13,200 with normal hemoglobin.  Lactic acid is elevated at 4.2 ABG shows pH 7.382, PCO2 45, PO2 58 Vitals:   10/09/18 1946 10/09/18 1953  BP: 104/79 107/77  Pulse:    Resp:    Temp:    SpO2:      Final Clinical Impressions(s) / ED Diagnoses   Final diagnoses:  COVID-19  Respiratory failure with hypoxia, unspecified chronicity [facility])    ED Discharge Orders    None       Margarita Grizzle, MD 10/09/18 2033    Margarita Grizzle, MD 10/09/18 2035    Margarita Grizzle, MD 10/09/18 2344

## 2018-10-09 NOTE — Progress Notes (Signed)
Pharmacy Antibiotic Note  [facility] Roy Andrews is a 48 y.o. male admitted on 10/09/2018 with pneumonia.  Pharmacy has been consulted for vancomycin and cefepime dosing. COVID+. SCr 2 on admit. Estimated weight in the ER ~340 lbs per discussion with RN.  Vancomycin 1500mg  IV x 1 and cefepime 2g IV x 1 already given in the ER.  Plan: Vancomycin 1750 mg IV Q 24 hrs. Goal AUC 400-550. Expected AUC: 503 SCr used: 2.0 Cefepime 2g IV q8h Monitor clinical progress, c/s, renal function F/u de-escalation plan/LOT, vancomycin levels as indicated    Temp (24hrs), Avg:100.4 F (38 C), Min:100.4 F (38 C), Max:100.4 F (38 C)  Recent Labs  Lab 10/09/18 1801 10/09/18 2001 10/09/18 2020  WBC 13.2*  --  13.7*  CREATININE  --   --  2.00*  LATICACIDVEN 4.2* 2.5*  --     Estimated Creatinine Clearance: 69.8 mL/min (A) (by C-G formula based on SCr of 2 mg/dL (H)).    No Known Allergies  [name], PharmD, BCPS Please check AMION for all [facility] contact numbers Clinical Pharmacist 10/09/2018 10:18 PM

## 2018-10-09 NOTE — H&P (Signed)
NAME:  Roy Andrews, MRN:  [id number], DOB:  1971/03/25, LOS: 0 ADMISSION DATE:  10/09/2018, CONSULTATION DATE:  10/09/18   Brief History   48 year old man with obesity, HTN, DM presenting with lethargy found to be in acute hypoxemic respiratory failure intubated in ER.  CXR shows bilateral infiltrates, COVID positive.  History of present illness   [age] year old man with obesity, HTN, DM presenting with lethargy found to be in acute hypoxemic respiratory failure intubated in ER.  CXR shows bilateral infiltrates, COVID positive.  Past Medical History  DM HTN  Significant Hospital Events   10/09/18 Intubation  Consults:  10/09/18 PCCM  Procedures:  10/09/18 Intubation  Significant Diagnostic Tests:  10/09/18 CXR with bilateral infiltrates  Micro Data:  Pending  Antimicrobials:  Cefepime x 1 in ER   Objective   Blood pressure (!) 179/128, pulse (!) 128, temperature (!) 100.4 F (38 C), temperature source Oral, resp. rate (!) 24, height 5\' 11"  (1.803 m), SpO2 91 %.    Vent Mode: PRVC FiO2 (%):  [100 %] 100 % Set Rate:  [24 bmp] 24 bmp Vt Set:  [580 mL] 580 mL PEEP:  [18 cmH20] 18 cmH20  No intake or output data in the 24 hours ending 10/09/18 1912 There were no vitals filed for this visit.  Examination: General: obese man in NAD HENT: frothy secretions surrounding ETT Lungs: coarse bilaterally with +accessory muscle use Cardiovascular: tachycardic, ext warm Abdomen: protuberant, +BS Extremities: no edema Neuro: moving all 4 ext nonpurposefully, pupils equal GU: deferred  Resolved Hospital Problem list   na  Assessment & Plan:  A: SARS2-COVID with severe respiratory compromise DM HTN Morbid obesity  P: Admit to [place] Check H score, start actemra Plasma study enrollment if eligible Remdisivir, solumedrol   Best practice:  Diet: npo Pain/Anxiety/Delirium protocol (if indicated): fentanyl, propofol gtt titrated to RASS -1 VAP protocol (if  indicated): 6220 DVT prophylaxis: Lovenox 40mg  BID GI prophylaxis: protonix Glucose control: levemir 0.075Ul BID with high dose SSI, suspect may need to be increased Mobility: bedrest Code Status: full Family Communication: will reach out Disposition:   Labs   CBC: Recent Labs  Lab 10/09/18 1801  WBC 13.2*  NEUTROABS 10.0*  HGB 15.3  HCT 44.3  MCV 85.7  PLT 249    Basic Metabolic Panel: No results for input(s): NA, K, CL, CO2, GLUCOSE, BUN, CREATININE, CALCIUM, MG, PHOS in the last 168 hours. GFR: CrCl cannot be calculated (No successful lab value found.). Recent Labs  Lab 10/09/18 1801  WBC 13.2*  LATICACIDVEN 4.2*    Liver Function Tests: No results for input(s): AST, ALT, ALKPHOS, BILITOT, PROT, ALBUMIN in the last 168 hours. No results for input(s): LIPASE, AMYLASE in the last 168 hours. No results for input(s): AMMONIA in the last 168 hours.  ABG No results found for: PHART, PCO2ART, PO2ART, HCO3, TCO2, ACIDBASEDEF, O2SAT   Coagulation Profile: No results for input(s): INR, PROTIME in the last 168 hours.  Cardiac Enzymes: No results for input(s): CKTOTAL, CKMB, CKMBINDEX, TROPONINI in the last 168 hours.  HbA1C: No results found for: HGBA1C  CBG: No results for input(s): GLUCAP in the last 168 hours.  Review of Systems:   Cannot obtain due to patient sedation  Past Medical History  He,  has a past medical history of Diabetes mellitus without complication (HCC) and Obese.   Surgical History   History reviewed. No pertinent surgical history.   Social History   reports  that he has never smoked. He has never used smokeless tobacco. He reports that he does not drink alcohol or use drugs.   Family History   His family history is not on file.   Allergies No Known Allergies   Home Medications  Prior to Admission medications   Medication Sig Start Date End Date Taking? Authorizing Provider  naproxen (NAPROSYN) 500 MG tablet Take 1 tablet (500 mg  total) by mouth 2 (two) times daily. 05/12/17   Dietrich Pates, PA-C     Critical care time:  40 minutes critical care time not including any separately billable procedures.  Myrla Halsted MD [place] [facility] CCM On-call: #[phone]

## 2018-10-09 NOTE — ED Notes (Signed)
ED TO INPATIENT HANDOFF REPORT  ED Nurse Name and Phone #: Donetta Potts Name/Age/Gender [facility] 48 y.o. male Room/Bed: 024C/024C  Code Status   Code Status: Full Code  Home/SNF/Other Home Patient oriented to:  Is this baseline? No   Triage Complete: Triage complete  Chief Complaint RESP DISTRESS  Triage Note Pt BIB [facility] from home. Per family, pt has been weak and lethargic for 3 days. Upon EMS arrival, pt SPO2 35% RA. EMS then put pt on NR and got SPO2 up to 75% and then put pt on CPAP and SPO2 stayed around 70%. Pt has not had any fevers, EMS ascultated fluid in all 4 quadrants.   Allergies No Known Allergies  Level of Care/Admitting Diagnosis ED Disposition    ED Disposition Condition Comment   Admit  Hospital Area: [facility] [[id number]]  Level of Care: ICU [6]  Covid Evaluation: Confirmed COVID Positive  Isolation Risk Level: High Risk/Airborne (Aerosolizing procedure, nebulizer, intubated/ventilation, CPAP/BiPAP)  Diagnosis: COVID-19 [[id number]]  Admitting Physician: Lorin Glass [[id number]]  Attending Physician: Lorin Glass [[id number]]  Estimated length of stay: > 1 week  Certification:: I certify this patient will need inpatient services for at least 2 midnights  PT Class (Do Not Modify): Inpatient [101]  PT Acc Code (Do Not Modify): Private [1]       B Medical/Surgery History Past Medical History:  Diagnosis Date  . Diabetes mellitus without complication (HCC)   . Obese    History reviewed. No pertinent surgical history.   A IV Location/Drains/Wounds Patient Lines/Drains/Airways Status   Active Line/Drains/Airways    Name:   Placement date:   Placement time:   Site:   Days:   Peripheral IV 10/09/18 Left Antecubital   10/09/18    1705    Antecubital   less than 1   Peripheral IV 10/09/18 Right Antecubital   10/09/18    1757    Antecubital   less than 1   Peripheral IV 10/09/18 Left Hand   10/09/18    1923     Hand   less than 1   Airway 8 mm   10/09/18    1745     less than 1          Intake/Output Last 24 hours No intake or output data in the 24 hours ending 10/09/18 1941  Labs/Imaging Results for orders placed or performed during the hospital encounter of 10/09/18 (from the past 48 hour(s))  SARS Coronavirus 2 (CEPHEID- Performed in [facility] hospital lab), Hosp Order     Status: Abnormal   Collection Time: 10/09/18  5:30 PM  Result Value Ref Range   SARS Coronavirus 2 POSITIVE (A) NEGATIVE    Comment: RESULT CALLED TO, READ BACK BY AND VERIFIED WITH: R HARDY RN 1847 10/09/18 A BROWNING (NOTE) If result is NEGATIVE SARS-CoV-2 target nucleic acids are NOT DETECTED. The SARS-CoV-2 RNA is generally detectable in upper and lower  respiratory specimens during the acute phase of infection. The lowest  concentration of SARS-CoV-2 viral copies this assay can detect is 250  copies / mL. A negative result does not preclude SARS-CoV-2 infection  and should not be used as the sole basis for treatment or other  patient management decisions.  A negative result may occur with  improper specimen collection / handling, submission of specimen other  than nasopharyngeal swab, presence of viral mutation(s) within the  areas targeted by this assay, and inadequate  number of viral copies  (<250 copies / mL). A negative result must be combined with clinical  observations, patient history, and epidemiological information. If result is POSITIVE SARS-CoV-2 target nucleic acids are DETECTED. The  SARS-CoV-2 RNA is generally detectable in upper and lower  respiratory specimens during the acute phase of infection.  Positive  results are indicative of active infection with SARS-CoV-2.  Clinical  correlation with patient history and other diagnostic information is  necessary to determine patient infection status.  Positive results do  not rule out bacterial infection or co-infection with other viruses. If  result is PRESUMPTIVE POSTIVE SARS-CoV-2 nucleic acids MAY BE PRESENT.   A presumptive positive result was obtained on the submitted specimen  and confirmed on repeat testing.  While 2019 novel coronavirus  (SARS-CoV-2) nucleic acids may be present in the submitted sample  additional confirmatory testing may be necessary for epidemiological  and / or clinical management purposes  to differentiate between  SARS-CoV-2 and other Sarbecovirus currently known to infect humans.  If clinically indicated additional testing with an alternate test  methodology [phone]) is a dvised. The SARS-CoV-2 RNA is generally  detectable in upper and lower respiratory specimens during the acute  phase of infection. The expected result is Negative. Fact Sheet for Patients:  BoilerBrush.com.cy Fact Sheet for Healthcare Providers: https://pope.com/ This test is not yet approved or cleared by the [place] FDA and has been authorized for detection and/or diagnosis of SARS-CoV-2 by FDA under an Emergency Use Authorization (EUA).  This EUA will remain in effect (meaning this test can be used) for the duration of the COVID-19 declaration under Section 564(b)(1) of the Act, 21 U.S.C. section 360bbb-3(b)(1), unless the authorization is terminated or revoked sooner. Performed at [facility], [address] [place], [place] [address]   Lactic acid, plasma     Status: Abnormal   Collection Time: 10/09/18  6:01 PM  Result Value Ref Range   Lactic Acid, Venous 4.2 (HH) 0.5 - 1.9 mmol/L    Comment: CRITICAL RESULT CALLED TO, READ BACK BY AND VERIFIED WITH: R HARDY RN AT 1847 ON [id number] BY Marveen Reeks Performed at [facility], [address], [place], [place] [address]   CBC WITH DIFFERENTIAL     Status: Abnormal   Collection Time: 10/09/18  6:01 PM  Result Value Ref Range   WBC 13.2 (H) 4.0 - 10.5 K/uL   RBC 5.17 4.22 - 5.81 MIL/uL   Hemoglobin 15.3  13.0 - 17.0 g/dL   HCT 29.5 28.4 - 13.2 %   MCV 85.7 80.0 - 100.0 fL   MCH 29.6 26.0 - 34.0 pg   MCHC 34.5 30.0 - 36.0 g/dL   RDW 44.0 10.2 - 72.5 %   Platelets 249 150 - 400 K/uL   nRBC 0.9 (H) 0.0 - 0.2 %   Neutrophils Relative % 76 %   Neutro Abs 10.0 (H) 1.7 - 7.7 K/uL   Lymphocytes Relative 13 %   Lymphs Abs 1.7 0.7 - 4.0 K/uL   Monocytes Relative 7 %   Monocytes Absolute 0.9 0.1 - 1.0 K/uL   Eosinophils Relative 0 %   Eosinophils Absolute 0.0 0.0 - 0.5 K/uL   Basophils Relative 0 %   Basophils Absolute 0.0 0.0 - 0.1 K/uL   WBC Morphology See Note     Comment: Increased Bands. >20% Bands  Mild Left Shift. 1 to 5% Metas and Myelos, Occ Pro Noted.    Immature Granulocytes 4 %  Abs Immature Granulocytes 0.56 (H) 0.00 - 0.07 K/uL    Comment: Performed at [facility], [address] [place], [place] [address]   Dg Chest Port 1 View  Result Date: 10/09/2018 CLINICAL DATA:  Respiratory failure. EXAM: PORTABLE CHEST 1 VIEW COMPARISON:  Chest x-ray 05/12/2017 FINDINGS: The endotracheal tube is approximately 4.6 cm above the carina. The heart is mildly enlarged in the mediastinal and hilar contours are prominent. Some of this is due to the AP projection and the supine position of the patient. Significant bilateral airspace process, likely diffuse pneumonia. No definite pleural effusions. The bony thorax is grossly intact. IMPRESSION: 1. The ET tube is 4.6 cm above the carina. 2. Significant bilateral airspace process, right slightly greater than left. Electronically Signed   By: Rudie MeyerP.  Gallerani M.D.   On: 10/09/2018 18:45    Pending Labs Unresulted Labs (From admission, onward)    Start     Ordered   10/10/18 0500  Blood gas, arterial  Tomorrow morning,   R     10/09/18 1857   10/10/18 0500  CBC with Differential/Platelet  Daily,   R     10/09/18 1901   10/10/18 0500  Comprehensive metabolic panel  Daily,   R     10/09/18 1901   10/10/18 0500  C-reactive protein  Daily,   R      10/09/18 1901   10/10/18 0500  CK  Daily,   R     10/09/18 1901   10/10/18 0500  D-dimer, quantitative (not at [facility])  Daily,   R     10/09/18 1901   10/10/18 0500  Fibrin derivatives D-Dimer ([facility] only)  Daily,   R     10/09/18 1901   10/10/18 0500  Ferritin  Daily,   R     10/09/18 1901   10/10/18 0500  Interleukin-6, Plasma  Daily,   R     10/09/18 1901   10/10/18 0500  Magnesium  Daily,   R     10/09/18 1901   10/10/18 0500  Phosphorus  Daily,   R     10/09/18 1901   10/10/18 0500  Triglycerides  Daily,   R     10/09/18 1901   10/09/18 1904  ABO/Rh  Once,   R     10/09/18 1903   10/09/18 1900  SARS Coronavirus 2 (CEPHEID - Performed in [facility]), [facility]  Once,   R    Question:  Rule Out  Answer:  no already ordered>   10/09/18 1859   10/09/18 1900  SARS Coronavirus 2 [facility](Hospital order, Performed in [facility] lab)  (Novel Coronavirus, [facility](Hospital Order))  Once,   R     10/09/18 1901   10/09/18 1854  HIV antibody (Routine Testing)  Once,   R     10/09/18 1857   10/09/18 1801  Lactic acid, plasma  Now then every 2 hours,   STAT     10/09/18 1801   10/09/18 1801  Blood Culture (routine x 2)  BLOOD CULTURE X 2,   STAT     10/09/18 1801          Vitals/Pain Today's Vitals   10/09/18 1708 10/09/18 1709 10/09/18 1805 10/09/18 1830  BP:    (!) 179/128  Pulse:    (!) 128  Resp:    (!) 24  Temp:    (!) 100.4 F (38 C)  TempSrc:  Oral  SpO2: (!) 77%  91% 91%  Height:   5\' 11"  (1.803 m)   PainSc:  0-No pain      Isolation Precautions Airborne and Contact precautions  Medications Medications  propofol (DIPRIVAN) 1000 MG/100ML infusion (has no administration in time range)  norepinephrine (LEVOPHED) 4mg  in premix infusion (has no administration in time range)  vancomycin (VANCOCIN) 1,500 mg in sodium chloride 0.9 % 500 mL IVPB (has no administration in time range)  enoxaparin (LOVENOX) injection 40 mg (has no administration in  time range)  insulin aspart (novoLOG) injection 0-20 Units (has no administration in time range)  insulin detemir (LEVEMIR) injection 0.075 Units/kg (has no administration in time range)  fentaNYL in NS (57mcg/ml) infusion-PREMIX (has no administration in time range)  fentaNYL (SUBLIMAZE) injection 50 mcg (has no administration in time range)  propofol (DIPRIVAN) 1000 MG/100ML infusion (has no administration in time range)  tocilizumab (ACTEMRA) 4 mg/kg in sodium chloride 0.9 % 100 mL infusion (has no administration in time range)  methylPREDNISolone sodium succinate (SOLU-MEDROL) injection 1 mg/kg (has no administration in time range)  pantoprazole (PROTONIX) injection 40 mg (has no administration in time range)  ceFEPIme (MAXIPIME) 2 g in sodium chloride 0.9 % 100 mL IVPB (2 g Intravenous New Bag/Given 10/09/18 1758)  etomidate (AMIDATE) injection (10 mg Intravenous Given 10/09/18 1839)  succinylcholine (ANECTINE) injection (100 mg Intravenous Given 10/09/18 1840)  rocuronium (ZEMURON) injection (100 mg Intravenous Given 10/09/18 1840)    Mobility non-ambulatory Low fall risk   Focused Assessments Pulmonary Assessment Handoff:  Lung sounds:   O2 Device: Bi-PAP        R Recommendations: See Admitting Provider Note  Report given to:   Additional Notes:

## 2018-10-09 NOTE — Progress Notes (Signed)
No OG tube in place on my arrival. Patient is vomiting. Possible aspiration. Copious amount of tan secretions noted.

## 2018-10-09 NOTE — ED Notes (Signed)
[facility] called for transport. 

## 2018-10-10 ENCOUNTER — Inpatient Hospital Stay (HOSPITAL_COMMUNITY): Payer: Medicaid Other

## 2018-10-10 DIAGNOSIS — L8990 Pressure ulcer of unspecified site, unspecified stage: Secondary | ICD-10-CM

## 2018-10-10 DIAGNOSIS — R740 Nonspecific elevation of levels of transaminase and lactic acid dehydrogenase [LDH]: Secondary | ICD-10-CM | POA: Diagnosis not present

## 2018-10-10 DIAGNOSIS — J80 Acute respiratory distress syndrome: Secondary | ICD-10-CM

## 2018-10-10 DIAGNOSIS — U071 COVID-19: Secondary | ICD-10-CM | POA: Diagnosis not present

## 2018-10-10 DIAGNOSIS — N179 Acute kidney failure, unspecified: Secondary | ICD-10-CM

## 2018-10-10 LAB — CBC WITH DIFFERENTIAL/PLATELET
Abs Immature Granulocytes: 0.63 10*3/uL — ABNORMAL HIGH (ref 0.00–0.07)
Basophils Absolute: 0 10*3/uL (ref 0.0–0.1)
Basophils Relative: 0 %
Eosinophils Absolute: 0 10*3/uL (ref 0.0–0.5)
Eosinophils Relative: 0 %
HCT: 46.5 % (ref 39.0–52.0)
Hemoglobin: 15.4 g/dL (ref 13.0–17.0)
Immature Granulocytes: 4 %
Lymphocytes Relative: 15 %
Lymphs Abs: 2.4 10*3/uL (ref 0.7–4.0)
MCH: 29.7 pg (ref 26.0–34.0)
MCHC: 33.1 g/dL (ref 30.0–36.0)
MCV: 89.6 fL (ref 80.0–100.0)
Monocytes Absolute: 1 10*3/uL (ref 0.1–1.0)
Monocytes Relative: 6 %
Neutro Abs: 11.7 10*3/uL — ABNORMAL HIGH (ref 1.7–7.7)
Neutrophils Relative %: 75 %
Platelets: 250 10*3/uL (ref 150–400)
RBC: 5.19 MIL/uL (ref 4.22–5.81)
RDW: 13.2 % (ref 11.5–15.5)
WBC: 15.7 10*3/uL — ABNORMAL HIGH (ref 4.0–10.5)
nRBC: 0.6 % — ABNORMAL HIGH (ref 0.0–0.2)

## 2018-10-10 LAB — POCT I-STAT 7, (LYTES, BLD GAS, ICA,H+H)
Bicarbonate: 25.9 mmol/L (ref 20.0–28.0)
Bicarbonate: 28.5 mmol/L — ABNORMAL HIGH (ref 20.0–28.0)
Bicarbonate: 27.1 mmol/L (ref 20.0–28.0)
Calcium, Ion: 1.01 mmol/L — ABNORMAL LOW (ref 1.15–1.40)
Calcium, Ion: 1.03 mmol/L — ABNORMAL LOW (ref 1.15–1.40)
Calcium, Ion: 1.04 mmol/L — ABNORMAL LOW (ref 1.15–1.40)
HCT: 44 % (ref 39.0–52.0)
HCT: 45 % (ref 39.0–52.0)
HCT: 41 % (ref 39.0–52.0)
Hemoglobin: 15 g/dL (ref 13.0–17.0)
Hemoglobin: 15.3 g/dL (ref 13.0–17.0)
Hemoglobin: 13.9 g/dL (ref 13.0–17.0)
O2 Saturation: 94 %
O2 Saturation: 100 %
O2 Saturation: 98 %
Patient temperature: 99.3
Patient temperature: 98.7
Patient temperature: 98.6
Potassium: 4.4 mmol/L (ref 3.5–5.1)
Potassium: 4.7 mmol/L (ref 3.5–5.1)
Potassium: 4.2 mmol/L (ref 3.5–5.1)
Sodium: 125 mmol/L — ABNORMAL LOW (ref 135–145)
Sodium: 129 mmol/L — ABNORMAL LOW (ref 135–145)
Sodium: 135 mmol/L (ref 135–145)
TCO2: 27 mmol/L (ref 22–32)
TCO2: 30 mmol/L (ref 22–32)
TCO2: 29 mmol/L (ref 22–32)
pCO2 arterial: 48.8 mmHg — ABNORMAL HIGH (ref 32.0–48.0)
pCO2 arterial: 62.8 mmHg — ABNORMAL HIGH (ref 32.0–48.0)
pCO2 arterial: 52.3 mmHg — ABNORMAL HIGH (ref 32.0–48.0)
pH, Arterial: 7.335 — ABNORMAL LOW (ref 7.350–7.450)
pH, Arterial: 7.265 — ABNORMAL LOW (ref 7.350–7.450)
pH, Arterial: 7.323 — ABNORMAL LOW (ref 7.350–7.450)
pO2, Arterial: 78 mmHg — ABNORMAL LOW (ref 83.0–108.0)
pO2, Arterial: 298 mmHg — ABNORMAL HIGH (ref 83.0–108.0)
pO2, Arterial: 124 mmHg — ABNORMAL HIGH (ref 83.0–108.0)

## 2018-10-10 LAB — COMPREHENSIVE METABOLIC PANEL
ALT: 592 U/L — ABNORMAL HIGH (ref 0–44)
AST: 1485 U/L — ABNORMAL HIGH (ref 15–41)
Albumin: 2.6 g/dL — ABNORMAL LOW (ref 3.5–5.0)
Alkaline Phosphatase: 64 U/L (ref 38–126)
Anion gap: 14 (ref 5–15)
BUN: 40 mg/dL — ABNORMAL HIGH (ref 6–20)
CO2: 23 mmol/L (ref 22–32)
Calcium: 7.4 mg/dL — ABNORMAL LOW (ref 8.9–10.3)
Chloride: 88 mmol/L — ABNORMAL LOW (ref 98–111)
Creatinine, Ser: 1.93 mg/dL — ABNORMAL HIGH (ref 0.61–1.24)
GFR calc Af Amer: 47 mL/min — ABNORMAL LOW (ref >60)
GFR calc non Af Amer: 41 mL/min — ABNORMAL LOW (ref >60)
Glucose, Bld: 398 mg/dL — ABNORMAL HIGH (ref 70–99)
Potassium: 4.5 mmol/L (ref 3.5–5.1)
Sodium: 125 mmol/L — ABNORMAL LOW (ref 135–145)
Total Bilirubin: 0.8 mg/dL (ref 0.3–1.2)
Total Protein: 7 g/dL (ref 6.5–8.1)

## 2018-10-10 LAB — GLUCOSE, CAPILLARY
Glucose-Capillary: 379 mg/dL — ABNORMAL HIGH (ref 70–99)
Glucose-Capillary: 426 mg/dL — ABNORMAL HIGH (ref 70–99)
Glucose-Capillary: 414 mg/dL — ABNORMAL HIGH (ref 70–99)
Glucose-Capillary: 414 mg/dL — ABNORMAL HIGH (ref 70–99)
Glucose-Capillary: 443 mg/dL — ABNORMAL HIGH (ref 70–99)
Glucose-Capillary: 458 mg/dL — ABNORMAL HIGH (ref 70–99)
Glucose-Capillary: 381 mg/dL — ABNORMAL HIGH (ref 70–99)
Glucose-Capillary: 318 mg/dL — ABNORMAL HIGH (ref 70–99)
Glucose-Capillary: 284 mg/dL — ABNORMAL HIGH (ref 70–99)
Glucose-Capillary: 238 mg/dL — ABNORMAL HIGH (ref 70–99)
Glucose-Capillary: 191 mg/dL — ABNORMAL HIGH (ref 70–99)
Glucose-Capillary: 198 mg/dL — ABNORMAL HIGH (ref 70–99)
Glucose-Capillary: 213 mg/dL — ABNORMAL HIGH (ref 70–99)
Glucose-Capillary: 174 mg/dL — ABNORMAL HIGH (ref 70–99)

## 2018-10-10 LAB — MAGNESIUM: Magnesium: 2.2 mg/dL (ref 1.7–2.4)

## 2018-10-10 LAB — D-DIMER, QUANTITATIVE: D-Dimer, Quant: 2.15 ug/mL-FEU — ABNORMAL HIGH (ref 0.00–0.50)

## 2018-10-10 LAB — BRAIN NATRIURETIC PEPTIDE: B Natriuretic Peptide: 87 pg/mL (ref 0.0–100.0)

## 2018-10-10 LAB — ABO/RH: ABO/RH(D): A NEG

## 2018-10-10 LAB — TYPE AND SCREEN
ABO/RH(D): A NEG
Antibody Screen: NEGATIVE

## 2018-10-10 LAB — LACTIC ACID, PLASMA: Lactic Acid, Venous: 1.9 mmol/L (ref 0.5–1.9)

## 2018-10-10 LAB — TRIGLYCERIDES: Triglycerides: 289 mg/dL — ABNORMAL HIGH (ref <150)

## 2018-10-10 LAB — PHOSPHORUS: Phosphorus: 5 mg/dL — ABNORMAL HIGH (ref 2.5–4.6)

## 2018-10-10 LAB — FERRITIN: Ferritin: 5983 ng/mL — ABNORMAL HIGH (ref 24–336)

## 2018-10-10 LAB — C-REACTIVE PROTEIN: CRP: 31.7 mg/dL — ABNORMAL HIGH (ref <1.0)

## 2018-10-10 LAB — PATHOLOGIST SMEAR REVIEW

## 2018-10-10 LAB — CK
Total CK: 1191 U/L — ABNORMAL HIGH (ref 49–397)
Total CK: 1256 U/L — ABNORMAL HIGH (ref 49–397)

## 2018-10-10 LAB — TROPONIN I
Troponin I: 0.04 ng/mL (ref <0.03)
Troponin I: <0.03 ng/mL (ref <0.03)

## 2018-10-10 LAB — PROCALCITONIN: Procalcitonin: 1.92 ng/mL

## 2018-10-10 MED ORDER — INSULIN REGULAR(HUMAN) IN NACL 100-0.9 UT/100ML-% IV SOLN
INTRAVENOUS | Status: DC
  Administered 2018-10-10: 14.4 [IU]/h via INTRAVENOUS
  Administered 2018-10-10: 3.5 [IU]/h via INTRAVENOUS
  Administered 2018-10-11: 10 [IU]/h via INTRAVENOUS
  Administered 2018-10-11: 8.5 [IU]/h via INTRAVENOUS
  Administered 2018-10-12: 15.7 [IU]/h via INTRAVENOUS
  Administered 2018-10-12: 9.3 [IU]/h via INTRAVENOUS
  Administered 2018-10-13: 11.8 [IU]/h via INTRAVENOUS
  Administered 2018-10-13: 12.3 [IU]/h via INTRAVENOUS
  Administered 2018-10-13: 12.4 [IU]/h via INTRAVENOUS
  Administered 2018-10-14: 9.6 [IU]/h via INTRAVENOUS
  Filled 2018-10-10 (×9): qty 100

## 2018-10-10 MED ORDER — VECURONIUM BROMIDE 10 MG IV SOLR: 15.0000 mg | Freq: Once | INTRAVENOUS | Status: DC

## 2018-10-10 MED ORDER — SODIUM CHLORIDE 0.9% IV SOLUTION: Freq: Once | INTRAVENOUS | Status: DC

## 2018-10-10 MED ORDER — CHLORHEXIDINE GLUCONATE 0.12% ORAL RINSE (MEDLINE KIT)
15.0000 mL | Freq: Two times a day (BID) | OROMUCOSAL | Status: DC
  Administered 2018-10-10 – 2018-10-16 (×13): 15 mL via OROMUCOSAL

## 2018-10-10 MED ORDER — VECURONIUM BROMIDE 10 MG IV SOLR
10.0000 mg | Freq: Once | INTRAVENOUS | Status: AC
  Administered 2018-10-10: 04:00:00 via INTRAVENOUS

## 2018-10-10 MED ORDER — INSULIN REGULAR BOLUS VIA INFUSION
0.0000 [IU] | Freq: Three times a day (TID) | INTRAVENOUS | Status: DC
  Filled 2018-10-10: qty 10

## 2018-10-10 MED ORDER — VECURONIUM BROMIDE 10 MG IV SOLR
INTRAVENOUS | Status: AC
  Administered 2018-10-10: 04:00:00 via INTRAVENOUS
  Filled 2018-10-10: qty 10

## 2018-10-10 MED ORDER — INSULIN DETEMIR 100 UNIT/ML ~~LOC~~ SOLN: 0.1000 [IU]/kg | Freq: Two times a day (BID) | SUBCUTANEOUS | Status: DC

## 2018-10-10 MED ORDER — INSULIN ASPART 100 UNIT/ML ~~LOC~~ SOLN
25.0000 [IU] | Freq: Once | SUBCUTANEOUS | Status: AC
  Administered 2018-10-10: 25 [IU] via SUBCUTANEOUS

## 2018-10-10 MED ORDER — SODIUM CHLORIDE 0.9 % IV SOLN: INTRAVENOUS | Status: DC

## 2018-10-10 MED ORDER — DEXTROSE 50 % IV SOLN: 25.0000 mL | INTRAVENOUS | Status: DC | PRN

## 2018-10-10 MED ORDER — SODIUM CHLORIDE 0.9 % IV SOLN: INTRAVENOUS | Status: DC | PRN

## 2018-10-10 MED ORDER — ORAL CARE MOUTH RINSE
15.0000 mL | OROMUCOSAL | Status: DC
  Administered 2018-10-10 – 2018-10-17 (×69): 15 mL via OROMUCOSAL

## 2018-10-10 NOTE — Progress Notes (Signed)
Patient remains prone.  Head repositioned.

## 2018-10-10 NOTE — Progress Notes (Signed)
Received patient from [facility]. Placed on mechanical ventilation. Previous settings of PRVC  580-24-100% +18.  8.0 ETT secured 26@lips . Alarms set .  AMBU bag placed at head of bed.

## 2018-10-10 NOTE — Plan of Care (Signed)
I, Alberteen Sam, MD, consented Subject [name] (male, Date of Birth 12-Apr-1971, 48 y.o.) and with diagnosis of COVID-19, in the [facility] Expanded Access Program (EAP) Research Protocol for [employer] against COVID-19.  The consent took place under following circumstances.   Subject Capacity assessed by this investigator as:  Absence of emotional and mental capacity to consent (i.e. delirium, coma, ventilator).  Consent took place in the following setting(s):  In-room, face to face   The following were present for the consent process:  Field seismologist, identified as [name], wife, has signed and dated the consent.  The consent has been added to subject's chart. All consent was completed via telephonic interpreter.   A copy of the cover letter and signed consent document was provided to subject/LAR.  The original signed consent document has been placed in the subject's physical chart and will be scanned into the electronic medical record upon discharge.  Statement of acknowledgement that the following was discussed with the subject/LAR:    1) Discussed the purpose of the research and procedures  2) Discussed risks and benefits and uncertainties of study participation 3) Discussed subject's responsibilities  4) Discussed the measures in place to maintain subject's confidentiality while a participant on the trial  5) Discussed alternatives to study participation.   6) Discussed study participation is voluntary and that the subject's care would not be jeopardized if they declined participation in the study.   7) Discussed freedom to withdraw at any time.   8) All subject/LAR questions were answered to their satisfaction.   9) In case of emergency consent, investigator agreed to discuss with subject/LAR at earliest available opportunity when the subject stabilizes and/or LAR can be located.     Final Investigator Signature   [place], [place] [phone]   Date: 10/10/2018 and 2:23 PM

## 2018-10-10 NOTE — Procedures (Signed)
Central Venous Catheter Insertion Procedure Note Roy Andrews [id number] 1970-10-25  Procedure: Insertion of Central Venous Catheter Indications: Drug and/or fluid administration  Procedure Details Consent: Unable to obtain consent because of altered level of consciousness. Time Out: Verified patient identification, verified procedure, site/side was marked, verified correct patient position, special equipment/implants available, medications/allergies/relevent history reviewed, required imaging and test results available.  Performed  Maximum sterile technique was used including antiseptics, cap, gloves, gown, hand hygiene, mask and sheet. Skin prep: Chlorhexidine; local anesthetic administered A antimicrobial bonded/coated triple lumen catheter was placed in the left internal jugular vein using the Seldinger technique.  Evaluation Blood flow good Complications: No apparent complications Patient did tolerate procedure well. Chest X-ray ordered to verify placement.  CXR: normal.  Roy Andrews 10/10/2018, 3:31 AM

## 2018-10-10 NOTE — Progress Notes (Signed)
Afternoon nursing assessment complete and remains as previously charted.

## 2018-10-10 NOTE — Progress Notes (Addendum)
PROGRESS NOTE    [place]  CWC:[id number] DOB: Apr 07, 1971 DOA: 10/09/2018 PCP: Patient, No Pcp Per      Brief Narrative:  Roy Andrews is a 48 y.o. M with obesity who presented with malaise, lethargy and weakness, progressive over several days.    Reportedly, EMS found patient with SpO2 30s%, came up to 70% with CPAP en route.  In ER, CXR with bilateral opacities, was altered and intubated.        Assessment & Plan:  Coronavirus pneumonitis with acute hypoxic respiratory failure In setting of ongoing 2020 COVID-19 pandemic.  Remdesivir and Actemra contraindicated in setting of transaminitis.  Consented for plasma via [facility], wife through interpreter.  -Administer convalescent plasma -Continue steroids 60 q8h day 2 of 5  -VTE PPx with Lovenox, ICU dosing  -Daily d-dimer, ferritin and CRP   COVID-19 Labs Recent Labs    10/09/18 2020 10/10/18 0200  DDIMER 5.21* 2.15*  FERRITIN 6,924* 5,983*  CRP 28.4* 31.7*      Possible community acquired pneumonia -Continue empiric vancomycin and cefepime -Obtain tracheal aspirate -Obtain MRSA nares and de-escalate if able  Hyponatremia Euvolemic -Trend Na -Obtain urine lytes  Acute renal failure Baseline Cr unknown.  Here 2.0 mg/dL on admission. 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  No alcohol history obtained. -Check CK -Check viral serologies, HIV  Elevated troponin Likely septic injury or demand mismatch.  Doubt plaque rupture. -Trend troponin -Check BNP  Hyperglycemia Unknown if preivously diabetic.  Glucoses trending up >300 mg/dL today. -Hold Levemir and SSI -Start insulin gtt -Check A1c  Pressure injury sacrum stage II present on arrival        MDM and disposition: The below labs and imaging reports were reviewed and summarized above.  Medication management as above.  The patient was admitted with hypoxic  respiratory failure from [place].  He has also liver, renal and cardiac end organ damage.  High risk for mortality.   The patient is critically ill with multi-organ failure.  Critical care was necessary to treat or prevent imminent or life-threatening deterioration of sepsis, respiratory failure, cardiac failure, renal failure and was exclusive of separately billable procedures and treating other patients. Total critical care time spent by me: 55 minutes Time spent personally by me on obtaining history from patient or surrogate, evaluation of the patient, evaluation of patient's response to treatment, ordering and review of laboratory studies, ordering and review of radiographic studies, ordering and performing treatments and interventions, and re-evaluation of the patient's condition.       Ventilator best practice:  Diet: Start TFs tomorrow VAP protocol (if indicated): In place GI prophylaxis: Pantoprazole Glucose control: Insulin Mobility: Defer for now  DVT prophylaxis: Lovenox Code Status: FULL Family Communication: Wife     Consultants:   PCCM  Procedures:   ETT 6/3  Art line 6/3  Antimicrobials:   Vancomycin 6/2 >>  Cefepime 6/2 >>      Culture data:   6/2 blood culture x1 -- NGTD  6/3 respiratory culture -- pending     Subjective: Intubated and sedated.    Objective: Vitals:   10/10/18 1215 10/10/18 1230 10/10/18 1245 10/10/18 1300  BP:      Pulse: 74 69 68 68  Resp:      Temp:      TempSrc:      SpO2: 95% 95% 94% 94%  Weight:      Height:  Intake/Output Summary (Last 24 hours) at 10/10/2018 1419 Last data filed at 10/10/2018 1130 Gross per 24 hour  Intake 1825.17 ml  Output 1450 ml  Net 375.17 ml   Filed Weights   10/09/18 2000 10/10/18 0500  Weight: (!) 154.2 kg (!) 157.4 kg    Examination: General appearance: obese adult male, intubated and sedated HEENT: Pupils equal.  Anicteric, conjunctiva pink, lids and lashes normal. No  nasal deformity, discharge, epistaxis.  Lips moist.   Skin: Warm and dry.  no jaundice.  No suspicious rashes or lesions. Cardiac: RRR, nl S1-S2, no murmurs appreciated.  Capillary refill is brisk.  JVP not visible.  No LE edema.  Radia  pulses 2+ and symmetric. Respiratory: On ventilator, ETT in place, proned. Lung sounds diminished. Abdomen: Proned. MSK: No deformities or effusions. Neuro: Sedated.   Psych: Unable to assess.    Data Reviewed: I have personally reviewed following labs and imaging studies:  CBC: Recent Labs  Lab 10/09/18 1801 10/09/18 2006 10/09/18 2020 10/10/18 0200 10/10/18 0209 10/10/18 0656  WBC 13.2*  --  13.7* 15.7*  --   --   NEUTROABS 10.0*  --  10.6* 11.7*  --   --   HGB 15.3 14.6 14.9 15.4 15.0 15.3  HCT 44.3 43.0 43.3 46.5 44.0 45.0  MCV 85.7  --  87.3 89.6  --   --   PLT 249  --  230 250  --   --    Basic Metabolic Panel: Recent Labs  Lab 10/09/18 2006 10/09/18 2020 10/10/18 0200 10/10/18 0209 10/10/18 0656  NA 122* 123* 125* 125* 129*  K 4.5 4.6 4.5 4.4 4.7  CL  --  86* 88*  --   --   CO2  --  21* 23  --   --   GLUCOSE  --  319* 398*  --   --   BUN  --  34* 40*  --   --   CREATININE  --  2.00* 1.93*  --   --   CALCIUM  --  7.5* 7.4*  --   --   MG  --  2.0 2.2  --   --   PHOS  --  4.3 5.0*  --   --    GFR: Estimated Creatinine Clearance: 73.1 mL/min (A) (by C-G formula based on SCr of 1.93 mg/dL (H)). Liver Function Tests: Recent Labs  Lab 10/09/18 2020 10/10/18 0200  AST 1,510* 1,485*  ALT 564* 592*  ALKPHOS 62 64  BILITOT 1.1 0.8  PROT 6.9 7.0  ALBUMIN 2.4* 2.6*   No results for input(s): LIPASE, AMYLASE in the last 168 hours. No results for input(s): AMMONIA in the last 168 hours. Coagulation Profile: No results for input(s): INR, PROTIME in the last 168 hours. Cardiac Enzymes: Recent Labs  Lab 10/10/18 0200 10/10/18 0600  CKTOTAL 1,191*  --   TROPONINI  --  0.04*   BNP (last 3 results) No results for input(s):  PROBNP in the last 8760 hours. HbA1C: No results for input(s): HGBA1C in the last 72 hours. CBG: Recent Labs  Lab 10/10/18 0406 10/10/18 0738 10/10/18 1127 10/10/18 1230 10/10/18 1336  GLUCAP 426* 414* 414* 443* 458*   Lipid Profile: Recent Labs    10/10/18 0200  TRIG 289*   Thyroid Function Tests: No results for input(s): TSH, T4TOTAL, FREET4, T3FREE, THYROIDAB in the last 72 hours. Anemia Panel: Recent Labs    10/09/18 2020 10/10/18 0200  FERRITIN 6,924* 5,983*  Urine analysis: No results found for: COLORURINE, APPEARANCEUR, LABSPEC, PHURINE, GLUCOSEU, HGBUR, BILIRUBINUR, KETONESUR, PROTEINUR, UROBILINOGEN, NITRITE, LEUKOCYTESUR Sepsis Labs: '@LABRCNTIP' (procalcitonin:4,lacticacidven:4)  ) Recent Results (from the past 240 hour(s))  SARS Coronavirus 2 (CEPHEID- Performed in [facility] hospital lab), Hosp Order     Status: Abnormal   Collection Time: 10/09/18  5:30 PM  Result Value Ref Range Status   SARS Coronavirus 2 POSITIVE (A) NEGATIVE Final    Comment: RESULT CALLED TO, READ BACK BY AND VERIFIED WITH: R HARDY RN [id number] 10/09/18 A BROWNING (NOTE) If result is NEGATIVE SARS-CoV-2 target nucleic acids are NOT DETECTED. The SARS-CoV-2 RNA is generally detectable in upper and lower  respiratory specimens during the acute phase of infection. The lowest  concentration of SARS-CoV-2 viral copies this assay can detect is 250  copies / mL. A negative result does not preclude SARS-CoV-2 infection  and should not be used as the sole basis for treatment or other  patient management decisions.  A negative result may occur with  improper specimen collection / handling, submission of specimen other  than nasopharyngeal swab, presence of viral mutation(s) within the  areas targeted by this assay, and inadequate number of viral copies  (<250 copies / mL). A negative result must be combined with clinical  observations, patient history, and epidemiological information. If result  is POSITIVE SARS-CoV-2 target nucleic acids are DETECTED. The  SARS-CoV-2 RNA is generally detectable in upper and lower  respiratory specimens during the acute phase of infection.  Positive  results are indicative of active infection with SARS-CoV-2.  Clinical  correlation with patient history and other diagnostic information is  necessary to determine patient infection status.  Positive results do  not rule out bacterial infection or co-infection with other viruses. If result is PRESUMPTIVE POSTIVE SARS-CoV-2 nucleic acids MAY BE PRESENT.   A presumptive positive result was obtained on the submitted specimen  and confirmed on repeat testing.  While 2019 novel coronavirus  (SARS-CoV-2) nucleic acids may be present in the submitted sample  additional confirmatory testing may be necessary for epidemiological  and / or clinical management purposes  to differentiate between  SARS-CoV-2 and other Sarbecovirus currently known to infect humans.  If clinically indicated additional testing with an alternate test  methodology [id number]) is a dvised. The SARS-CoV-2 RNA is generally  detectable in upper and lower respiratory specimens during the acute  phase of infection. The expected result is Negative. Fact Sheet for Patients:  StrictlyIdeas.no Fact Sheet for Healthcare Providers: BankingDealers.co.za This test is not yet approved or cleared by the [place] FDA and has been authorized for detection and/or diagnosis of SARS-CoV-2 by FDA under an Emergency Use Authorization (EUA).  This EUA will remain in effect (meaning this test can be used) for the duration of the COVID-19 declaration under Section 564(b)(1) of the Act, 21 U.S.C. section 360bbb-3(b)(1), unless the authorization is terminated or revoked sooner. Performed at [facility], [place] [address] [place], [place] [address]   Blood Culture (routine x 2)     Status: None  (Preliminary result)   Collection Time: 10/09/18  6:09 PM  Result Value Ref Range Status   Specimen Description BLOOD RIGHT ANTECUBITAL  Final   Special Requests   Final    BOTTLES DRAWN AEROBIC AND ANAEROBIC Blood Culture adequate volume   Culture   Final    NO GROWTH < 12 HOURS Performed at [facility], [place] [address] [place],  [address]  Report Status PENDING  Incomplete  Culture, respiratory (non-expectorated)     Status: None (Preliminary result)   Collection Time: 10/10/18  9:04 AM  Result Value Ref Range Status   Specimen Description   Final    TRACHEAL ASPIRATE Performed at [place] [address], [place], [place] [address]    Special Requests   Final    Normal Performed at [facility], [place] [address] [place], [place] [address]    Gram Stain   Final    NO WBC SEEN NO ORGANISMS SEEN Performed at [facility], [place] [address], [place], [place] [address]    Culture PENDING  Incomplete   Report Status PENDING  Incomplete         Radiology Studies: Dg Abd 1 View  Result Date: 10/10/2018 CLINICAL DATA:  OG tube. EXAM: ABDOMEN - 1 VIEW COMPARISON:  None. FINDINGS: The OG tube projects over the gastric body. The tip is pointed distally. The bowel gas pattern is nonspecific. IMPRESSION: OG tube projects over the gastric body. Electronically Signed   By: Constance Holster M.D.   On: 10/10/2018 03:29   Dg Chest Port 1 View  Result Date: 10/10/2018 CLINICAL DATA:  Pneumonia EXAM: PORTABLE CHEST 1 VIEW COMPARISON:  10/09/2018 FINDINGS: The endotracheal tube terminates above the carina by approximately 3.3 cm. The left-sided central venous catheter is well positioned with the tip terminating near the SVC. That will it has is there is no evidence of a pneumothorax. Multifocal airspace opacities are again noted with some slight improved aeration in the upper lung fields. The heart size remains enlarged. The enteric  tube extends below the left hemidiaphragm. IMPRESSION: 1. Well-positioned left-sided central venous catheter without evidence of a pneumothorax. Remaining lines and tubes as above. 2. Multifocal airspace opacities are again noted with some improved aeration in the upper lung zones. Electronically Signed   By: Constance Holster M.D.   On: 10/10/2018 03:28   Dg Chest Port 1 View  Result Date: 10/09/2018 CLINICAL DATA:  Respiratory failure. EXAM: PORTABLE CHEST 1 VIEW COMPARISON:  Chest x-ray 05/12/2017 FINDINGS: The endotracheal tube is approximately 4.6 cm above the carina. The heart is mildly enlarged in the mediastinal and hilar contours are prominent. Some of this is due to the AP projection and the supine position of the patient. Significant bilateral airspace process, likely diffuse pneumonia. No definite pleural effusions. The bony thorax is grossly intact. IMPRESSION: 1. The ET tube is 4.6 cm above the carina. 2. Significant bilateral airspace process, right slightly greater than left. Electronically Signed   By: Marijo Sanes M.D.   On: 10/09/2018 18:45        Scheduled Meds: . sodium chloride   Intravenous Once  . chlorhexidine gluconate (MEDLINE KIT)  15 mL Mouth Rinse BID  . enoxaparin (LOVENOX) injection  75 mg Subcutaneous Q12H  . fentaNYL (SUBLIMAZE) injection  50 mcg Intravenous Once  . insulin regular  0-10 Units Intravenous TID WC  . mouth rinse  15 mL Mouth Rinse 10 times per day  . methylPREDNISolone (SOLU-MEDROL) injection  60 mg Intravenous Q8H  . midazolam  2 mg Intravenous Once  . midazolam  2 mg Intravenous Once  . pantoprazole (PROTONIX) IV  40 mg Intravenous QHS   Continuous Infusions: . sodium chloride    . sodium chloride    . ceFEPime (MAXIPIME) IV 200 mL/hr at 10/10/18 1130  . fentaNYL infusion INTRAVENOUS 200 mcg/hr (10/10/18 1130)  . insulin  11.9 Units/hr (10/10/18 1337)  . norepinephrine (LEVOPHED) Adult infusion 1 mcg/min (10/10/18 1130)  . propofol  (DIPRIVAN) infusion 50 mcg/kg/min (10/10/18 1328)  . vancomycin       LOS: 1 day    Time spent:    The assigned patient code is [id number] Unique Dashboard Link: [email or web address]     During this encounter: Patient Isolation: Airborne, contact, droplet HCP PPE: Face shield, head covering, N95, gown, gloves, and shoe covers    Edwin Dada, MD [facility] 10/10/2018, 2:19 PM     Please page through [place]:  [email or web address] Password [email or web address] If 7PM-7AM, please contact night-coverage

## 2018-10-10 NOTE — ED Notes (Signed)
Pt transported to [facility] for Covid treatment via [facility].

## 2018-10-10 NOTE — Progress Notes (Addendum)
PCCM progress note  Patient examined on arrival to [facility] On Levophed at 18 mcg ABG 7.33/49/78/94% on vent settings of 100% FiO2, 18 of PEEP, 8 cc/kg tidal volume Plateau pressure is 33  Blood pressure (!) 115/97, pulse 85, temperature (!) 100.4 F (38 C), temperature source Oral, resp. rate (!) 24, height 5\' 11"  (1.803 m), weight (!) 154.2 kg, SpO2 94 %. Gen:      Obese HEENT:  EOMI, sclera anicteric Neck:     No masses; no thyromegaly, ET tube Lungs:    Clear to auscultation bilaterally; normal respiratory effort CV:         Regular rate and rhythm; no murmurs Abd:      + bowel sounds; soft, non-tender; no palpable masses, no distension Ext:    No edema; adequate peripheral perfusion Skin:      Warm and dry; no rash Neuro: Sedated, unresponsive  Assessment/plan: Severe ARDS secondary to COVID-19  Reduce TV to 6 cc/kg.  Maintain driving pressure of less than 15 Follow plateau pressure, ABG, CXR We will place central line, A-line and prone him Does not need paralytics as he is synchronous with the vent Given 150 mg Solu-Medrol.  Continue steroids at 60 mg every 8hrs Not a candidate for Actemra or remdesivir due to elevated LFTs Contact family to consent for convalescent plasma in AM.  The patient is critically ill with multiple organ system failure and requires high complexity decision making for assessment and support, frequent evaluation and titration of therapies, advanced monitoring, review of radiographic studies and interpretation of complex data.   Critical Care Time devoted to patient care services, exclusive of separately billable procedures, described in this note is 35 minutes.   Chilton Greathouse MD [facility] Pager [phone] If no answer call [phone] 10/10/2018, 3:27 AM

## 2018-10-10 NOTE — Procedures (Signed)
Arterial Catheter Insertion Procedure Note Roy Andrews [id number] 1971-03-26  Procedure: Insertion of Arterial Catheter  Indications: Blood pressure monitoring  Procedure Details Consent: Unable to obtain consent because of altered level of consciousness. Time Out: Verified patient identification, verified procedure, site/side was marked, verified correct patient position, special equipment/implants available, medications/allergies/relevent history reviewed, required imaging and test results available.  Performed  Maximum sterile technique was used including antiseptics, cap, gloves, gown, hand hygiene, mask and sheet. Skin prep: Chlorhexidine; local anesthetic administered 20 gauge catheter was inserted into right radial artery using the Seldinger technique. ULTRASOUND GUIDANCE USED: NO Evaluation Blood flow good; BP tracing good. Complications: No apparent complications.   Roy Andrews [facility] 10/10/2018

## 2018-10-10 NOTE — Progress Notes (Signed)
Patient remains prone.  Head repositioned. 

## 2018-10-10 NOTE — Progress Notes (Signed)
  Patient proned at this time .  Tolerated well. Head turned to right.

## 2018-10-10 NOTE — Progress Notes (Signed)
NAME:  Roy Andrews, MRN:  [id number], DOB:  14-Aug-1970, LOS: 1 ADMISSION DATE:  10/09/2018, CONSULTATION DATE:  10/09/18   Brief History   48 year old man with obesity, HTN, DM presenting with lethargy found to be in acute hypoxemic respiratory failure intubated in ER.  CXR shows bilateral infiltrates, COVID positive.  History of present illness   48 year old man with obesity, HTN, DM presenting with lethargy found to be in acute hypoxemic respiratory failure intubated in ER.  CXR shows bilateral infiltrates, COVID positive.  Past Medical History  DM HTN  Significant Hospital Events   10/09/18 Intubation  Consults:  10/09/18 PCCM  Procedures:  10/09/18 Intubation  Significant Diagnostic Tests:  10/09/18 CXR with bilateral infiltrates  Micro Data:  Pending  Antimicrobials:  Cefepime x 1 in ER  Interval Events  Admitted to [facility] overnight. CVC placed. Patient proned.  Objective   Blood pressure 119/72, pulse 68, temperature (!) 96.5 F (35.8 C), temperature source Axillary, resp. rate (!) 35, height 5\' 11"  (1.803 m), weight (!) 157.4 kg, SpO2 94 %.    Vent Mode: PRVC FiO2 (%):  [70 %-100 %] 70 % Set Rate:  [24 bmp-35 bmp] 35 bmp Vt Set:  [450 mL-580 mL] 450 mL PEEP:  [12 cmH20-18 cmH20] 12 cmH20 Plateau Pressure:  [26 cmH20-33 cmH20] 26 cmH20   Intake/Output Summary (Last 24 hours) at 10/10/2018 1411 Last data filed at 10/10/2018 1130 Gross per 24 hour  Intake 1825.17 ml  Output 1450 ml  Net 375.17 ml   Filed Weights   10/09/18 2000 10/10/18 0500  Weight: (!) 154.2 kg (!) 157.4 kg   Physical Exam: General: Obese, critically ill-appearing, sedated HENT: [place], AT, ETT in place Eyes: EOMI, no scleral icterus Respiratory: Diminished breath sounds bilaterally.  No crackles, wheezing or rales Cardiovascular: RRR, -M/R/G, no JVD GI: BS+, soft, nontender Extremities:-Edema,-tenderness, cool to touch Neuro: Sedated Skin: Intact, no rashes or bruising GU: Foley  in place  CXR 6/3 reviewed and interpreted by me: Diffuse bilateral airspace disease  Resolved Hospital Problem list   na  Assessment & Plan:   ARDS secondary to COVID-19: Improved oxygenation with proning Not a candidate for tocilizumab or remdesivir due to transaminitis Titrate PEEP/FIO2 per ARDSnet protocol for goal SpO2 88-95% or pO2 55-80 Continue proned position for 16 hours ABG 1 hour prior and post- supine position  Scheduled steroids D1 Consenting for convalescent plasma Continue broad spectrum antibiotics Obtain trach aspirate VAP Sedation goal for RASS -4 Continue weight-based lovenox  Hypotension: secondary to sedation Wean levophed for MAP goal >65  AKI: Unknown baseline Monitor UOP/Cr  Hyperglycemia Start insulin gtt  Best practice:  Diet: npo Pain/Anxiety/Delirium protocol (if indicated): fentanyl, propofol gtt titrated to RASS -4 VAP protocol (if indicated): Yes DVT prophylaxis: Lovenox 40mg  BID GI prophylaxis: protonix Glucose control: insulin gtt Mobility: bedrest Code Status: full Family [facility] ICU  Labs   CBC: Recent Labs  Lab 10/09/18 1801 10/09/18 2006 10/09/18 2020 10/10/18 0200 10/10/18 0209 10/10/18 0656  WBC 13.2*  --  13.7* 15.7*  --   --   NEUTROABS 10.0*  --  10.6* 11.7*  --   --   HGB 15.3 14.6 14.9 15.4 15.0 15.3  HCT 44.3 43.0 43.3 46.5 44.0 45.0  MCV 85.7  --  87.3 89.6  --   --   PLT 249  --  230 250  --   --     Basic Metabolic Panel:  Recent Labs  Lab 10/09/18 2006 10/09/18 2020 10/10/18 0200 10/10/18 0209 10/10/18 0656  NA 122* 123* 125* 125* 129*  K 4.5 4.6 4.5 4.4 4.7  CL  --  86* 88*  --   --   CO2  --  21* 23  --   --   GLUCOSE  --  319* 398*  --   --   BUN  --  34* 40*  --   --   CREATININE  --  2.00* 1.93*  --   --   CALCIUM  --  7.5* 7.4*  --   --   MG  --  2.0 2.2  --   --   PHOS  --  4.3 5.0*  --   --    GFR: Estimated Creatinine Clearance: 73.1 mL/min (A) (by C-G  formula based on SCr of 1.93 mg/dL (H)). Recent Labs  Lab 10/09/18 1801 10/09/18 2001 10/09/18 2020 10/10/18 0200 10/10/18 0600  WBC 13.2*  --  13.7* 15.7*  --   LATICACIDVEN 4.2* 2.5*  --   --  1.9    Liver Function Tests: Recent Labs  Lab 10/09/18 2020 10/10/18 0200  AST 1,510* 1,485*  ALT 564* 592*  ALKPHOS 62 64  BILITOT 1.1 0.8  PROT 6.9 7.0  ALBUMIN 2.4* 2.6*   No results for input(s): LIPASE, AMYLASE in the last 168 hours. No results for input(s): AMMONIA in the last 168 hours.  ABG    Component Value Date/Time   PHART 7.265 (L) 10/10/2018 0656   PCO2ART 62.8 (H) 10/10/2018 0656   PO2ART 298.0 (H) 10/10/2018 0656   HCO3 28.5 (H) 10/10/2018 0656   TCO2 30 10/10/2018 0656   O2SAT 100.0 10/10/2018 0656     Coagulation Profile: No results for input(s): INR, PROTIME in the last 168 hours.  Cardiac Enzymes: Recent Labs  Lab 10/10/18 0200 10/10/18 0600  CKTOTAL 1,191*  --   TROPONINI  --  0.04*    HbA1C: No results found for: HGBA1C  CBG: Recent Labs  Lab 10/10/18 0406 10/10/18 0738 10/10/18 1127 10/10/18 1230 10/10/18 1336  GLUCAP 426* 414* 414* 443* 458*   Critical Care Time: 38 minutes  The patient is critically ill with multiple organ systems failure and requires high complexity decision making for assessment and support, frequent evaluation and titration of therapies, application of advanced monitoring technologies and extensive interpretation of multiple databases.   Discussed and co-managed patient care with PCCM-Hospitalist. Coordinated care with RT, RN and pharmacist.  Mechele Collin, M.D. [facility] Pulmonary/Critical Care Medicine Pager: [phone] After hours pager: [phone]

## 2018-10-10 NOTE — Progress Notes (Signed)
Patient proned. Assisted with turning of patients head. Tolerated well.

## 2018-10-10 NOTE — Progress Notes (Signed)
Changed vent settings to 6ccl/kg per ABG results/CCM. 450-32-100% +18

## 2018-10-10 NOTE — Progress Notes (Signed)
Inpatient Diabetes Program Recommendations  AACE/ADA: New Consensus Statement on Inpatient Glycemic Control (2015)  Target Ranges:  Prepandial:   less than 140 mg/dL      Peak postprandial:   less than 180 mg/dL (1-2 hours)      Critically ill patients:  140 - 180 mg/dL   Lab Results  Component Value Date   GLUCAP 414 (H) 10/10/2018    Review of Glycemic Control Results for Roy Andrews, KLEPACKI (MRN [id number]) as of 10/10/2018 11:27  Ref. Range 10/10/2018 01:19 10/10/2018 04:06 10/10/2018 07:38  Glucose-Capillary Latest Ref Range: 70 - 99 mg/dL 188 (H) 677 (H) 373 (H)   Diabetes history: DM2 Outpatient Diabetes medications: None Current orders for Inpatient glycemic control: Starting on IV insulin drip per Glucostabilizer when IV insulin arrives from pharmacy  Inpatient Diabetes Program Recommendations:   Spoke with RN Cristy Friedlander and waiting on delivery of IV insulin drip. Reviewed plan of care with RN. Will follow during hospitalization. -A1c to determine prehospital glycemic control  Thank you, [name]. Ivin Rosenbloom, RN, MSN, CDE  Diabetes Coordinator Inpatient Glycemic Control Team Team Pager [phone] (8am-5pm) 10/10/2018 11:42 AM

## 2018-10-10 NOTE — Progress Notes (Signed)
Patient arrived to [facility] around 0030 via [facility]. Report received from Maralyn Sago, RN [facility]. Patient transferred from stretcher to bed with RNs and RT at bedside. Patient with ETT, OG, PIVx2. Restraints on patient at time of arrival - order from ED still valid. MD notified of arrival. See flowsheets for initial assessment and vital signs. Will continue to closely monitor.

## 2018-10-10 NOTE — Progress Notes (Signed)
Initial Nutrition Assessment   RD working remotely.  DOCUMENTATION CODES:   Morbid obesity  INTERVENTION:   Tube Feeding:  Vital High Protein at 45 ml/hr Pro-Stat 60 mL TID Provides 1680 kcals, 185 g of protein and 907 mL of free water  Juven BID, each packet provides 80 calories, 8 grams of carbohydrate, 2.5  grams of protein (collagen), 7 grams of L-arginine and 7 grams of L-glutamine; supplement contains CaHMB, Vitamins C, E, B12 and Zinc to promote wound healing   NUTRITION DIAGNOSIS:   Inadequate oral intake related to acute illness as evidenced by NPO status.  GOAL:   Patient will meet greater than or equal to 90% of their needs  MONITOR:   TF tolerance, Vent status, Labs, Weight trends, Skin  REASON FOR ASSESSMENT:   Ventilator    ASSESSMENT:   48 yo male admitted with ARDS secondary to COVID-19 pneumonits requiring intubation, AKI.  PMH include HTN, DM, obesity   Proned since 5:30 AM  Patient is currently intubated on ventilator support, off levophed, fentanyl for sedation, weaning off propofol MV: 15.3 L/min Temp (24hrs), Avg:98.2 F (36.8 C), Min:96.5 F (35.8 C), Max:98.8 F (37.1 C)  Propofol: D/C today  Current wt 160.2 kg; admission weight 157.4 kg. Net +1 L. Plan to utilize 157 kg as current dry wt  Unable to obtain diet and weight history at this time  OG tube with tip in gastric body, pointed distally  Labs: reviewed; HgbA1c 10.0 Meds: insulin drip, solumedrol   NUTRITION - FOCUSED PHYSICAL EXAM:  Unable to assess, working remotely  Diet Order:   Diet Order            Diet NPO time specified  Diet effective now              EDUCATION NEEDS:   Not appropriate for education at this time  Skin:  Skin Assessment: Skin Integrity Issues: Stage II: sacrum  Last BM:  6/4  Height:   Ht Readings from Last 1 Encounters:  10/10/18 5\' 11"  (1.803 m)    Weight:   Wt Readings from Last 1 Encounters:  10/11/18 (!) 160.2 kg     Ideal Body Weight:  78 kg  BMI:  Body mass index is 49.26 kg/m.  Estimated Nutritional Needs:   Kcal:  1722-2198 kcals   Protein:  172-195 g  Fluid:  >/= 1.8 L   Romelle Starcher MS, RD, LDN, CNSC [phone] Pager  [phone] Weekend/On-Call Pager

## 2018-10-10 NOTE — Progress Notes (Signed)
Patient proned around [phone] with RT and multiple RNs at bedside per [facility], MD. Patient tolerated movement well. Will continue to closely monitor.

## 2018-10-11 DIAGNOSIS — N179 Acute kidney failure, unspecified: Secondary | ICD-10-CM | POA: Diagnosis not present

## 2018-10-11 DIAGNOSIS — U071 COVID-19: Secondary | ICD-10-CM | POA: Diagnosis not present

## 2018-10-11 DIAGNOSIS — J988 Other specified respiratory disorders: Secondary | ICD-10-CM | POA: Diagnosis not present

## 2018-10-11 DIAGNOSIS — J80 Acute respiratory distress syndrome: Secondary | ICD-10-CM | POA: Diagnosis not present

## 2018-10-11 LAB — COMPREHENSIVE METABOLIC PANEL
ALT: 526 U/L — ABNORMAL HIGH (ref 0–44)
AST: 595 U/L — ABNORMAL HIGH (ref 15–41)
Albumin: 2.4 g/dL — ABNORMAL LOW (ref 3.5–5.0)
Alkaline Phosphatase: 56 U/L (ref 38–126)
Anion gap: 12 (ref 5–15)
BUN: 38 mg/dL — ABNORMAL HIGH (ref 6–20)
CO2: 24 mmol/L (ref 22–32)
Calcium: 7.6 mg/dL — ABNORMAL LOW (ref 8.9–10.3)
Chloride: 97 mmol/L — ABNORMAL LOW (ref 98–111)
Creatinine, Ser: 1.55 mg/dL — ABNORMAL HIGH (ref 0.61–1.24)
GFR calc Af Amer: >60 mL/min (ref >60)
GFR calc non Af Amer: 53 mL/min — ABNORMAL LOW (ref >60)
Glucose, Bld: 158 mg/dL — ABNORMAL HIGH (ref 70–99)
Potassium: 4.3 mmol/L (ref 3.5–5.1)
Sodium: 133 mmol/L — ABNORMAL LOW (ref 135–145)
Total Bilirubin: 0.6 mg/dL (ref 0.3–1.2)
Total Protein: 6.8 g/dL (ref 6.5–8.1)

## 2018-10-11 LAB — POCT I-STAT 7, (LYTES, BLD GAS, ICA,H+H)
Acid-Base Excess: 2 mmol/L (ref 0.0–2.0)
Acid-Base Excess: 1 mmol/L (ref 0.0–2.0)
Acid-base deficit: 1 mmol/L (ref 0.0–2.0)
Bicarbonate: 29.1 mmol/L — ABNORMAL HIGH (ref 20.0–28.0)
Bicarbonate: 25.6 mmol/L (ref 20.0–28.0)
Bicarbonate: 28 mmol/L (ref 20.0–28.0)
Calcium, Ion: 1.08 mmol/L — ABNORMAL LOW (ref 1.15–1.40)
Calcium, Ion: 1.02 mmol/L — ABNORMAL LOW (ref 1.15–1.40)
Calcium, Ion: 1.08 mmol/L — ABNORMAL LOW (ref 1.15–1.40)
HCT: 40 % (ref 39.0–52.0)
HCT: 37 % — ABNORMAL LOW (ref 39.0–52.0)
HCT: 39 % (ref 39.0–52.0)
Hemoglobin: 13.6 g/dL (ref 13.0–17.0)
Hemoglobin: 12.6 g/dL — ABNORMAL LOW (ref 13.0–17.0)
Hemoglobin: 13.3 g/dL (ref 13.0–17.0)
O2 Saturation: 100 %
O2 Saturation: 97 %
O2 Saturation: 99 %
Potassium: 4.5 mmol/L (ref 3.5–5.1)
Potassium: 4 mmol/L (ref 3.5–5.1)
Potassium: 4.7 mmol/L (ref 3.5–5.1)
Sodium: 135 mmol/L (ref 135–145)
Sodium: 137 mmol/L (ref 135–145)
Sodium: 136 mmol/L (ref 135–145)
TCO2: 31 mmol/L (ref 22–32)
TCO2: 27 mmol/L (ref 22–32)
TCO2: 30 mmol/L (ref 22–32)
pCO2 arterial: 54.6 mmHg — ABNORMAL HIGH (ref 32.0–48.0)
pCO2 arterial: 51.9 mmHg — ABNORMAL HIGH (ref 32.0–48.0)
pCO2 arterial: 54.5 mmHg — ABNORMAL HIGH (ref 32.0–48.0)
pH, Arterial: 7.335 — ABNORMAL LOW (ref 7.350–7.450)
pH, Arterial: 7.301 — ABNORMAL LOW (ref 7.350–7.450)
pH, Arterial: 7.319 — ABNORMAL LOW (ref 7.350–7.450)
pO2, Arterial: 208 mmHg — ABNORMAL HIGH (ref 83.0–108.0)
pO2, Arterial: 103 mmHg (ref 83.0–108.0)
pO2, Arterial: 134 mmHg — ABNORMAL HIGH (ref 83.0–108.0)

## 2018-10-11 LAB — GLUCOSE, CAPILLARY
Glucose-Capillary: 159 mg/dL — ABNORMAL HIGH (ref 70–99)
Glucose-Capillary: 158 mg/dL — ABNORMAL HIGH (ref 70–99)
Glucose-Capillary: 154 mg/dL — ABNORMAL HIGH (ref 70–99)
Glucose-Capillary: 177 mg/dL — ABNORMAL HIGH (ref 70–99)
Glucose-Capillary: 82 mg/dL (ref 70–99)
Glucose-Capillary: 167 mg/dL — ABNORMAL HIGH (ref 70–99)
Glucose-Capillary: 160 mg/dL — ABNORMAL HIGH (ref 70–99)
Glucose-Capillary: 146 mg/dL — ABNORMAL HIGH (ref 70–99)
Glucose-Capillary: 120 mg/dL — ABNORMAL HIGH (ref 70–99)
Glucose-Capillary: 182 mg/dL — ABNORMAL HIGH (ref 70–99)
Glucose-Capillary: 116 mg/dL — ABNORMAL HIGH (ref 70–99)
Glucose-Capillary: 185 mg/dL — ABNORMAL HIGH (ref 70–99)
Glucose-Capillary: 189 mg/dL — ABNORMAL HIGH (ref 70–99)
Glucose-Capillary: 104 mg/dL — ABNORMAL HIGH (ref 70–99)
Glucose-Capillary: 246 mg/dL — ABNORMAL HIGH (ref 70–99)
Glucose-Capillary: 233 mg/dL — ABNORMAL HIGH (ref 70–99)
Glucose-Capillary: 212 mg/dL — ABNORMAL HIGH (ref 70–99)
Glucose-Capillary: 232 mg/dL — ABNORMAL HIGH (ref 70–99)

## 2018-10-11 LAB — PREPARE FRESH FROZEN PLASMA: Unit division: 0

## 2018-10-11 LAB — HIV ANTIBODY (ROUTINE TESTING W REFLEX): HIV Screen 4th Generation wRfx: NONREACTIVE

## 2018-10-11 LAB — PROCALCITONIN: Procalcitonin: 2.1 ng/mL

## 2018-10-11 LAB — CBC
HCT: 41.8 % (ref 39.0–52.0)
Hemoglobin: 13.5 g/dL (ref 13.0–17.0)
MCH: 29.3 pg (ref 26.0–34.0)
MCHC: 32.3 g/dL (ref 30.0–36.0)
MCV: 90.9 fL (ref 80.0–100.0)
Platelets: 235 10*3/uL (ref 150–400)
RBC: 4.6 MIL/uL (ref 4.22–5.81)
RDW: 13.2 % (ref 11.5–15.5)
WBC: 17.6 10*3/uL — ABNORMAL HIGH (ref 4.0–10.5)
nRBC: 0.9 % — ABNORMAL HIGH (ref 0.0–0.2)

## 2018-10-11 LAB — PROTIME-INR
INR: 1.1 (ref 0.8–1.2)
Prothrombin Time: 13.8 seconds (ref 11.4–15.2)

## 2018-10-11 LAB — INTERLEUKIN-6, PLASMA: Interleukin-6, Plasma: 52.3 pg/mL — ABNORMAL HIGH (ref 0.0–12.2)

## 2018-10-11 LAB — OSMOLALITY: Osmolality: 297 mOsm/kg — ABNORMAL HIGH (ref 275–295)

## 2018-10-11 LAB — MAGNESIUM: Magnesium: 2.8 mg/dL — ABNORMAL HIGH (ref 1.7–2.4)

## 2018-10-11 LAB — HEMOGLOBIN A1C
Hgb A1c MFr Bld: 10 % — ABNORMAL HIGH (ref 4.8–5.6)
Mean Plasma Glucose: 240.3 mg/dL

## 2018-10-11 LAB — BPAM FFP
Blood Product Expiration Date: 202006042005
ISSUE DATE / TIME: 202006032007
Unit Type and Rh: 6200

## 2018-10-11 LAB — TRIGLYCERIDES: Triglycerides: 336 mg/dL — ABNORMAL HIGH (ref <150)

## 2018-10-11 LAB — MRSA PCR SCREENING: MRSA by PCR: NEGATIVE

## 2018-10-11 LAB — PHOSPHORUS: Phosphorus: 4.3 mg/dL (ref 2.5–4.6)

## 2018-10-11 MED ORDER — FUROSEMIDE 10 MG/ML IJ SOLN
40.0000 mg | Freq: Once | INTRAMUSCULAR | Status: AC
  Administered 2018-10-11: 40 mg via INTRAVENOUS
  Filled 2018-10-11: qty 4

## 2018-10-11 MED ORDER — MIDAZOLAM HCL 2 MG/2ML IJ SOLN
4.0000 mg | Freq: Once | INTRAMUSCULAR | Status: AC
  Administered 2018-10-11: 4 mg via INTRAVENOUS

## 2018-10-11 MED ORDER — PRO-STAT SUGAR FREE PO LIQD
60.0000 mL | Freq: Three times a day (TID) | ORAL | Status: DC
  Administered 2018-10-11 – 2018-10-16 (×15): 60 mL
  Filled 2018-10-11 (×15): qty 60

## 2018-10-11 MED ORDER — MIDAZOLAM HCL 2 MG/2ML IJ SOLN
INTRAMUSCULAR | Status: AC
  Filled 2018-10-11: qty 2

## 2018-10-11 MED ORDER — MIDAZOLAM 50MG/50ML (1MG/ML) PREMIX INFUSION
0.0000 mg/h | INTRAVENOUS | Status: DC
  Administered 2018-10-11: 4 mg/h via INTRAVENOUS
  Administered 2018-10-11: 6 mg/h via INTRAVENOUS
  Administered 2018-10-12: 7 mg/h via INTRAVENOUS
  Administered 2018-10-12 – 2018-10-13 (×3): 6 mg/h via INTRAVENOUS
  Administered 2018-10-13 – 2018-10-15 (×6): 5 mg/h via INTRAVENOUS
  Filled 2018-10-11 (×14): qty 50

## 2018-10-11 MED ORDER — MIDAZOLAM BOLUS VIA INFUSION
1.0000 mg | INTRAVENOUS | Status: DC | PRN
  Administered 2018-10-11 (×2): 2 mg via INTRAVENOUS
  Administered 2018-10-11: 1 mg via INTRAVENOUS
  Administered 2018-10-12 – 2018-10-15 (×10): 2 mg via INTRAVENOUS
  Filled 2018-10-11: qty 2

## 2018-10-11 MED ORDER — JUVEN PO PACK
1.0000 | PACK | Freq: Two times a day (BID) | ORAL | Status: DC
  Administered 2018-10-11 – 2018-10-22 (×13): 1
  Filled 2018-10-11 (×20): qty 1

## 2018-10-11 MED ORDER — VITAL HIGH PROTEIN PO LIQD
1000.0000 mL | ORAL | Status: DC
  Administered 2018-10-11 – 2018-10-14 (×3): 1000 mL

## 2018-10-11 NOTE — Progress Notes (Signed)
RT NOTE:  RT assisted with patient turn from prone to supine position. ETT secured at 25cm. Vitals stable throughout.

## 2018-10-11 NOTE — Progress Notes (Signed)
Inpatient Diabetes Program Recommendations  AACE/ADA: New Consensus Statement on Inpatient Glycemic Control (2015)  Target Ranges:  Prepandial:   less than 140 mg/dL      Peak postprandial:   less than 180 mg/dL (1-2 hours)      Critically ill patients:  140 - 180 mg/dL   Lab Results  Component Value Date   GLUCAP 146 (H) 10/11/2018   HGBA1C 10.0 (H) 10/11/2018    Review of Glycemic Control Results for Roy Andrews, Roy Andrews (MRN [id number]) as of 10/11/2018 13:21  Ref. Range 10/11/2018 07:12 10/11/2018 09:21 10/11/2018 10:30 10/11/2018 11:33 10/11/2018 12:38  Glucose-Capillary Latest Ref Range: 70 - 99 mg/dL 948 (H) 82 546 (H) 270 (H) 146 (H)   Diabetes history: DM 2 Outpatient Diabetes medications: None Current orders for Inpatient glycemic control: IV insulin-  Inpatient Diabetes Program Recommendations:    Blood sugars well controlled on insulin drip.  Agree with continuation of insulin drip.   Thanks  Beryl Meager, RN, BC-ADM Inpatient Diabetes Coordinator Pager [phone] (8a-5p)

## 2018-10-11 NOTE — Progress Notes (Signed)
PROGRESS NOTE    [place]  HUO:[id number] DOB: 1971-03-18 DOA: 10/09/2018 PCP: Patient, No Pcp Per      Brief Narrative:  [name] is a 48 y.o. M with obesity who presented with malaise, lethargy and weakness, progressive over several days.    Reportedly, EMS found patient with SpO2 30s%, came up to 70% with CPAP en route.  In ER, CXR with bilateral opacities, was altered and intubated.        Assessment & Plan:  Coronavirus pneumonitis with acute hypoxic respiratory failure In setting of ongoing 2020 COVID-19 pandemic.  Remdesivir and Actemra contraindicated in setting of transaminitis.    S/p convalescent plasma 6/3  Dimer down, ferritin and CRP still extremely high.  -Continue steroids 60 q8h day 3 of 5 -Continue VTE PPx with Lovenox, ICU dosing -Continue daily d-dimer, ferritin and CRP -Start Tube feeds -Stop propofol, start Versed  COVID-19 Labs Recent Labs    10/09/18 2020 10/10/18 0200  DDIMER 5.21* 2.15*  FERRITIN 6,924* 5,983*  CRP 28.4* 31.7*      Possible community acquired pneumonia Tracheal aspirate pending.  Procal 2 and trending up. -Continue empiric vancomycin and cefepime -Obtain MRSA nares and de-escalate if able  Hyponatremia Euvolemic.  Na improved today -Follow osmolalities -Trend Na  Acute renal failure Baseline Cr unknown.  Here 2.0 mg/dL on admission.  Improved to 1.55 mg/dL overnight. 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  No alcohol history obtained.  CK mild.  Transaminases improved to 500s today, INR normal. -Follow viral serologies, HIV  Elevated troponin Likely septic injury or demand mismatch.  Doubt plaque rupture.  Troponin elevation low and flat.  BNP normal.  Type 2 diabetes Unknown if preivously on diabetes medicines, but A1c 10%.  Glucoses improved on insulin gtt, but required >200 units insulin in last 24  hours. -Continue insulin gtt  Pressure injury sacrum stage II present on arrival         MDM and disposition: The below labs and imaging reports were reviewed and summarized above.  Medication management as above.  The patient was admitted with hypoxic respiratory failure from [place].  He has also liver, renal and cardiac end organ damage.  High risk for mortality.   The patient is critically ill with multiorgan failure.  Critical care is necessary to treat or prevent imminent or life-threatening deterioration of sepsis, respiratory failure, cardiac failure, renal failure, and was exclusive of separately billable procedures and treating other patients.  The total critical care time spent by me was 40 minutes.  Time was spent personally by me on obtaining history from the patient's surrogate, evaluating the patient, evaluating the patient's response to treatment, ordering and review of laboratory studies, ordering and review of radiographic studies, ordering and performing treatments and interventions, and reevaluation of the patient's condition.      Ventilator best practice:  Diet: Start tube feeds VAP protocol (if indicated): In place GI prophylaxis: Pantoprazole Glucose control: Insulin Mobility: Defer for now  DVT prophylaxis: Lovenox Code Status: FULL Family Communication: Wife     Consultants:   PCCM  Procedures:   ETT 6/3  Art line 6/3  Antimicrobials:   Vancomycin 6/2 >>  Cefepime 6/2 >>      Culture data:   6/2 blood culture x1 -- NGTD  6/3 respiratory culture -- pending     Subjective: Intubated and sedated.  Agitated overnight, required going up to the  maximum dose of Versed.  Objective: Vitals:   10/11/18 0845 10/11/18 0900 10/11/18 0915 10/11/18 0930  BP:  109/70    Pulse: 60 63 63 63  Resp:      Temp:      TempSrc:      SpO2: 100% 98% 100% 100%  Weight:      Height:        Intake/Output Summary (Last 24 hours) at 10/11/2018  1048 Last data filed at 10/11/2018 0900 Gross per 24 hour  Intake 2800.98 ml  Output 1950 ml  Net 850.98 ml   Filed Weights   10/09/18 2000 10/10/18 0500 10/11/18 0500  Weight: (!) 154.2 kg (!) 157.4 kg (!) 160.2 kg    Examination: General appearance: Obese adult male, prolonged, intubated and sedated. HEENT: Pupils equal.  Anicteric, conjunctiva pink, lids and lashes normal. No nasal deformity, discharge, epistaxis.  Lips moist.   Skin: Warm and dry.  no jaundice.  No suspicious rashes or lesions. Cardiac: Regular rate and rhythm, no murmurs, JVP not visible, no lower extremity edema. Respiratory: Round on ventilator, ET tube in place, lung sounds diminished.  No rales or wheezes.   Abdomen: Prone. MSK: No deformities or effusions. Neuro: Sedated.   Psych: Unable to assess.    Data Reviewed: I have personally reviewed following labs and imaging studies:  CBC: Recent Labs  Lab 10/09/18 1801  10/09/18 2020 10/10/18 0200 10/10/18 0209 10/10/18 0656 10/10/18 2113 10/11/18 0400 10/11/18 0822  WBC 13.2*  --  13.7* 15.7*  --   --   --  17.6*  --   NEUTROABS 10.0*  --  10.6* 11.7*  --   --   --   --   --   HGB 15.3   < > 14.9 15.4 15.0 15.3 13.9 13.5 13.6  HCT 44.3   < > 43.3 46.5 44.0 45.0 41.0 41.8 40.0  MCV 85.7  --  87.3 89.6  --   --   --  90.9  --   PLT 249  --  230 250  --   --   --  235  --    < > = values in this interval not displayed.   Basic Metabolic Panel: Recent Labs  Lab 10/09/18 2020 10/10/18 0200 10/10/18 0209 10/10/18 0656 10/10/18 2113 10/11/18 0400 10/11/18 0822  NA 123* 125* 125* 129* 135 133* 135  K 4.6 4.5 4.4 4.7 4.2 4.3 4.5  CL 86* 88*  --   --   --  97*  --   CO2 21* 23  --   --   --  24  --   GLUCOSE 319* 398*  --   --   --  158*  --   BUN 34* 40*  --   --   --  38*  --   CREATININE 2.00* 1.93*  --   --   --  1.55*  --   CALCIUM 7.5* 7.4*  --   --   --  7.6*  --   MG 2.0 2.2  --   --   --   --   --   PHOS 4.3 5.0*  --   --   --   --    --    GFR: Estimated Creatinine Clearance: 92.1 mL/min (A) (by C-G formula based on SCr of 1.55 mg/dL (H)). Liver Function Tests: Recent Labs  Lab 10/09/18 2020 10/10/18 0200 10/11/18 0400  AST 1,510* 1,485* 595*  ALT 564*  592* 526*  ALKPHOS 62 64 56  BILITOT 1.1 0.8 0.6  PROT 6.9 7.0 6.8  ALBUMIN 2.4* 2.6* 2.4*   No results for input(s): LIPASE, AMYLASE in the last 168 hours. No results for input(s): AMMONIA in the last 168 hours. Coagulation Profile: Recent Labs  Lab 10/11/18 0400  INR 1.1   Cardiac Enzymes: Recent Labs  Lab 10/10/18 0200 10/10/18 0600 10/10/18 1545  CKTOTAL 1,191*  --  1,256*  TROPONINI  --  0.04* <0.03   BNP (last 3 results) No results for input(s): PROBNP in the last 8760 hours. HbA1C: Recent Labs    10/11/18 0400  HGBA1C 10.0*   CBG: Recent Labs  Lab 10/11/18 0306 10/11/18 0500 10/11/18 0712 10/11/18 0921 10/11/18 1030  GLUCAP 158* 154* 177* 82 167*   Lipid Profile: Recent Labs    10/10/18 0200 10/11/18 0400  TRIG 289* 336*   Thyroid Function Tests: No results for input(s): TSH, T4TOTAL, FREET4, T3FREE, THYROIDAB in the last 72 hours. Anemia Panel: Recent Labs    10/09/18 2020 10/10/18 0200  FERRITIN 6,924* 5,983*   Urine analysis: No results found for: COLORURINE, APPEARANCEUR, LABSPEC, PHURINE, GLUCOSEU, HGBUR, BILIRUBINUR, KETONESUR, PROTEINUR, UROBILINOGEN, NITRITE, LEUKOCYTESUR Sepsis Labs: _0 (procalcitonin:4,lacticacidven:4)  ) Recent Results (from the past 240 hour(s))  SARS Coronavirus 2 (CEPHEID- Performed in [place] hospital lab), Hosp Order     Status: Abnormal   Collection Time: 10/09/18  5:30 PM  Result Value Ref Range Status   SARS Coronavirus 2 POSITIVE (A) NEGATIVE Final    Comment: RESULT CALLED TO, READ BACK BY AND VERIFIED WITH: R HARDY RN [id number] 10/09/18 A BROWNING (NOTE) If result is NEGATIVE SARS-CoV-2 target nucleic acids are NOT DETECTED. The SARS-CoV-2 RNA is generally  detectable in upper and lower  respiratory specimens during the acute phase of infection. The lowest  concentration of SARS-CoV-2 viral copies this assay can detect is 250  copies / mL. A negative result does not preclude SARS-CoV-2 infection  and should not be used as the sole basis for treatment or other  patient management decisions.  A negative result may occur with  improper specimen collection / handling, submission of specimen other  than nasopharyngeal swab, presence of viral mutation(s) within the  areas targeted by this assay, and inadequate number of viral copies  (<250 copies / mL). A negative result must be combined with clinical  observations, patient history, and epidemiological information. If result is POSITIVE SARS-CoV-2 target nucleic acids are DETECTED. The  SARS-CoV-2 RNA is generally detectable in upper and lower  respiratory specimens during the acute phase of infection.  Positive  results are indicative of active infection with SARS-CoV-2.  Clinical  correlation with patient history and other diagnostic information is  necessary to determine patient infection status.  Positive results do  not rule out bacterial infection or co-infection with other viruses. If result is PRESUMPTIVE POSTIVE SARS-CoV-2 nucleic acids MAY BE PRESENT.   A presumptive positive result was obtained on the submitted specimen  and confirmed on repeat testing.  While 2019 novel coronavirus  (SARS-CoV-2) nucleic acids may be present in the submitted sample  additional confirmatory testing may be necessary for epidemiological  and / or clinical management purposes  to differentiate between  SARS-CoV-2 and other Sarbecovirus currently known to infect humans.  If clinically indicated additional testing with an alternate test  methodology [phone]) is a dvised. The SARS-CoV-2 RNA is generally  detectable in upper and lower respiratory specimens during the acute  phase of  infection. The  expected result is Negative. Fact Sheet for Patients:  StrictlyIdeas.no Fact Sheet for Healthcare Providers: BankingDealers.co.za This test is not yet approved or cleared by the [place] FDA and has been authorized for detection and/or diagnosis of SARS-CoV-2 by FDA under an Emergency Use Authorization (EUA).  This EUA will remain in effect (meaning this test can be used) for the duration of the COVID-19 declaration under Section 564(b)(1) of the Act, 21 U.S.C. section 360bbb-3(b)(1), unless the authorization is terminated or revoked sooner. Performed at [facility], [place] [address], [place], [place] [address]   Blood Culture (routine x 2)     Status: None (Preliminary result)   Collection Time: 10/09/18  6:09 PM  Result Value Ref Range Status   Specimen Description BLOOD RIGHT ANTECUBITAL  Final   Special Requests   Final    BOTTLES DRAWN AEROBIC AND ANAEROBIC Blood Culture adequate volume   Culture   Final    NO GROWTH 2 DAYS Performed at [facility], [place] [address], [place], [place] [address]    Report Status PENDING  Incomplete  Culture, respiratory (non-expectorated)     Status: None (Preliminary result)   Collection Time: 10/10/18  9:04 AM  Result Value Ref Range Status   Specimen Description   Final    TRACHEAL ASPIRATE Performed at [facility] [address], [place], [place] [address]    Special Requests   Final    Normal Performed at [facility], [place] [address], [place], [place] [address]    Gram Stain NO WBC SEEN NO ORGANISMS SEEN   Final   Culture   Final    CULTURE REINCUBATED FOR BETTER GROWTH Performed at [facility], [place] [address] [place], [place] [address]    Report Status PENDING  Incomplete         Radiology Studies: Dg Abd 1 View  Result Date: 10/10/2018 CLINICAL DATA:  OG tube. EXAM: ABDOMEN - 1 VIEW COMPARISON:  None. FINDINGS: The  OG tube projects over the gastric body. The tip is pointed distally. The bowel gas pattern is nonspecific. IMPRESSION: OG tube projects over the gastric body. Electronically Signed   By: Constance Holster M.D.   On: 10/10/2018 03:29   Dg Chest Port 1 View  Result Date: 10/10/2018 CLINICAL DATA:  Pneumonia EXAM: PORTABLE CHEST 1 VIEW COMPARISON:  10/09/2018 FINDINGS: The endotracheal tube terminates above the carina by approximately 3.3 cm. The left-sided central venous catheter is well positioned with the tip terminating near the SVC. That will it has is there is no evidence of a pneumothorax. Multifocal airspace opacities are again noted with some slight improved aeration in the upper lung fields. The heart size remains enlarged. The enteric tube extends below the left hemidiaphragm. IMPRESSION: 1. Well-positioned left-sided central venous catheter without evidence of a pneumothorax. Remaining lines and tubes as above. 2. Multifocal airspace opacities are again noted with some improved aeration in the upper lung zones. Electronically Signed   By: Constance Holster M.D.   On: 10/10/2018 03:28   Dg Chest Port 1 View  Result Date: 10/09/2018 CLINICAL DATA:  Respiratory failure. EXAM: PORTABLE CHEST 1 VIEW COMPARISON:  Chest x-ray 05/12/2017 FINDINGS: The endotracheal tube is approximately 4.6 cm above the carina. The heart is mildly enlarged in the mediastinal and hilar contours are prominent. Some of this is due to the AP projection and the supine position of the patient. Significant bilateral airspace process,  likely diffuse pneumonia. No definite pleural effusions. The bony thorax is grossly intact. IMPRESSION: 1. The ET tube is 4.6 cm above the carina. 2. Significant bilateral airspace process, right slightly greater than left. Electronically Signed   By: Marijo Sanes M.D.   On: 10/09/2018 18:45        Scheduled Meds: . sodium chloride   Intravenous Once  . chlorhexidine gluconate (MEDLINE KIT)   15 mL Mouth Rinse BID  . enoxaparin (LOVENOX) injection  75 mg Subcutaneous Q12H  . fentaNYL (SUBLIMAZE) injection  50 mcg Intravenous Once  . insulin regular  0-10 Units Intravenous TID WC  . mouth rinse  15 mL Mouth Rinse 10 times per day  . methylPREDNISolone (SOLU-MEDROL) injection  60 mg Intravenous Q8H  . pantoprazole (PROTONIX) IV  40 mg Intravenous QHS   Continuous Infusions: . sodium chloride    . sodium chloride    . ceFEPime (MAXIPIME) IV 2 g (10/11/18 0925)  . fentaNYL infusion INTRAVENOUS 150 mcg/hr (10/11/18 0900)  . insulin 10.5 Units/hr (10/11/18 0714)  . norepinephrine (LEVOPHED) Adult infusion Stopped (10/10/18 1454)  . propofol (DIPRIVAN) infusion 20 mcg/kg/min (10/11/18 1023)  . vancomycin Stopped (10/10/18 2227)     LOS: 2 days    Time spent:    The assigned patient code is [id number] Unique Dashboard Link: [email or web address]     During this encounter: Patient Isolation: Airborne, contact, droplet HCP PPE: Face shield, head covering, N95, gown, gloves, and shoe covers    Edwin Dada, MD [facility] 10/11/2018, 10:48 AM     Please page through AMION:  [email or web address] Password [email or web address] If 7PM-7AM, please contact night-coverage

## 2018-10-11 NOTE — Progress Notes (Signed)
Pt. Returned to Prone position at this time following 8 hours supine per protocol without incident. Pt appears in NAD & VSS will continue to monitor.

## 2018-10-11 NOTE — Progress Notes (Signed)
NAME:  Roy Andrews, MRN:  [id number], DOB:  Oct 07, 1970, LOS: 2 ADMISSION DATE:  10/09/2018, CONSULTATION DATE:  10/09/18   Brief History   48 year old man with obesity, HTN, DM presenting with lethargy found to be in acute hypoxemic respiratory failure intubated in ER.  CXR shows bilateral infiltrates, COVID positive.  History of present illness   48 year old man with obesity, HTN, DM presenting with lethargy found to be in acute hypoxemic respiratory failure intubated in ER.  CXR shows bilateral infiltrates, COVID positive.  Past Medical History  DM HTN  Significant Hospital Events   6/2 Intubation 6/3- Proned 6/4- Prone and diuresis Consults:  10/09/18 PCCM  Procedures:  10/09/18 Intubation  Significant Diagnostic Tests:  10/09/18 CXR with bilateral infiltrates  Micro Data:  BCX 6/2 - pending Trach asp 6/3 - pending  Antimicrobials:  Vanc 6/2>6/4 Cefepime 6/2> Convalescent plasma 6/3  Interval Events  Proned overnight with improved oxygenation. Weaned off levophed. Attempted to self-extubate this morning  Objective   Blood pressure 135/78, pulse 64, temperature 98.6 F (37 C), temperature source Oral, resp. rate (!) 35, height 5\' 11"  (1.803 m), weight (!) 160.2 kg, SpO2 100 %.    Vent Mode: PRVC FiO2 (%):  [60 %-100 %] 80 % Set Rate:  [35 bmp] 35 bmp Vt Set:  [450 mL] 450 mL PEEP:  [12 cmH20-14 cmH20] 12 cmH20 Plateau Pressure:  [23 cmH20-26 cmH20] 23 cmH20   Intake/Output Summary (Last 24 hours) at 10/11/2018 1504 Last data filed at 10/11/2018 0900 Gross per 24 hour  Intake 2610.28 ml  Output 1200 ml  Net 1410.28 ml   Filed Weights   10/09/18 2000 10/10/18 0500 10/11/18 0500  Weight: (!) 154.2 kg (!) 157.4 kg (!) 160.2 kg   Physical Exam: General: Obese, critically ill-appearing, sedated HENT: [place], AT, ETT in place Eyes: EOMI, no scleral icterus Respiratory: Diminished breath sounds bilaterally. No crackles, wheezing or rales Cardiovascular:  RRR, -M/R/G, no JVD GI: BS+, soft, nontender Extremities:-Edema,-tenderness, warm to touch Neuro: Sedated GU: Foley in place  Resolved Hospital Problem list   Ischemic hepatitis  Assessment & Plan:   ARDS secondary to COVID-19: Improved oxygenation with proning Not a candidate for tocilizumab or remdesivir due to transaminitis Reviewed ABG. Wean FIO2. Continue proning protocol Titrate PEEP/FIO2 per ARDSnet protocol for goal SpO2 88-95% or pO2 55-80 Scheduled steroids D2 Gentle diuresis VAP Continue weight-based lovenox  Sedation on mechanical ventilation DC propofol d/t hypertriglyceridemia Add versed gtt Continue Fentanyl gtt RASS goal -4  Hypotension: secondary to sedation. Cannot rule out sepsis Off levophed DC Vanc Continue Cefepime  F/u trach aspirate  AKI: Unknown baseline. Adequate UOP Monitor UOP/Cr  Hyperglycemia Continue insulin gtt  Best practice:  Diet: Start TF Pain/Anxiety/Delirium protocol (if indicated): fentanyl, versed gtt titrated to RASS -4 VAP protocol (if indicated): Yes DVT prophylaxis: Lovenox 40mg  BID GI prophylaxis: protonix Glucose control: insulin gtt Mobility: bedrest Code Status: full Family Communication: per TRH Disposition: ICU  Labs   CBC: Recent Labs  Lab 10/09/18 1801  10/09/18 2020 10/10/18 0200  10/10/18 0656 10/10/18 2113 10/11/18 0400 10/11/18 0822 10/11/18 1134  WBC 13.2*  --  13.7* 15.7*  --   --   --  17.6*  --   --   NEUTROABS 10.0*  --  10.6* 11.7*  --   --   --   --   --   --   HGB 15.3   < > 14.9 15.4   < >  15.3 13.9 13.5 13.6 12.6*  HCT 44.3   < > 43.3 46.5   < > 45.0 41.0 41.8 40.0 37.0*  MCV 85.7  --  87.3 89.6  --   --   --  90.9  --   --   PLT 249  --  230 250  --   --   --  235  --   --    < > = values in this interval not displayed.    Basic Metabolic Panel: Recent Labs  Lab 10/09/18 2020 10/10/18 0200  10/10/18 0656 10/10/18 2113 10/11/18 0400 10/11/18 0822 10/11/18 1116 10/11/18  1134  NA 123* 125*   < > 129* 135 133* 135  --  137  K 4.6 4.5   < > 4.7 4.2 4.3 4.5  --  4.0  CL 86* 88*  --   --   --  97*  --   --   --   CO2 21* 23  --   --   --  24  --   --   --   GLUCOSE 319* 398*  --   --   --  158*  --   --   --   BUN 34* 40*  --   --   --  38*  --   --   --   CREATININE 2.00* 1.93*  --   --   --  1.55*  --   --   --   CALCIUM 7.5* 7.4*  --   --   --  7.6*  --   --   --   MG 2.0 2.2  --   --   --   --   --  2.8*  --   PHOS 4.3 5.0*  --   --   --   --   --  4.3  --    < > = values in this interval not displayed.   GFR: Estimated Creatinine Clearance: 92.1 mL/min (A) (by C-G formula based on SCr of 1.55 mg/dL (H)). Recent Labs  Lab 10/09/18 1801 10/09/18 2001 10/09/18 2020 10/10/18 0200 10/10/18 0600 10/10/18 1545 10/11/18 0400  PROCALCITON  --   --   --   --   --  1.92 2.10  WBC 13.2*  --  13.7* 15.7*  --   --  17.6*  LATICACIDVEN 4.2* 2.5*  --   --  1.9  --   --     Liver Function Tests: Recent Labs  Lab 10/09/18 2020 10/10/18 0200 10/11/18 0400  AST 1,510* 1,485* 595*  ALT 564* 592* 526*  ALKPHOS 62 64 56  BILITOT 1.1 0.8 0.6  PROT 6.9 7.0 6.8  ALBUMIN 2.4* 2.6* 2.4*   No results for input(s): LIPASE, AMYLASE in the last 168 hours. No results for input(s): AMMONIA in the last 168 hours.  ABG    Component Value Date/Time   PHART 7.301 (L) 10/11/2018 1134   PCO2ART 51.9 (H) 10/11/2018 1134   PO2ART 103.0 10/11/2018 1134   HCO3 25.6 10/11/2018 1134   TCO2 27 10/11/2018 1134   ACIDBASEDEF 1.0 10/11/2018 1134   O2SAT 97.0 10/11/2018 1134     Coagulation Profile: Recent Labs  Lab 10/11/18 0400  INR 1.1    Cardiac Enzymes: Recent Labs  Lab 10/10/18 0200 10/10/18 0600 10/10/18 1545  CKTOTAL 1,191*  --  1,256*  TROPONINI  --  0.04* <0.03    HbA1C: Hgb A1c MFr Bld  Date/Time  Value Ref Range Status  10/11/2018 04:00 AM 10.0 (H) 4.8 - 5.6 % Final    Comment:    (NOTE) Pre diabetes:          5.7%-6.4% Diabetes:               >6.4% Glycemic control for   <7.0% adults with diabetes     CBG: Recent Labs  Lab 10/11/18 0921 10/11/18 1030 10/11/18 1133 10/11/18 1238 10/11/18 1356  GLUCAP 82 167* 160* 146* 120*   Critical Care Time: 37 minutes  The patient is critically ill with multiple organ systems failure and requires high complexity decision making for assessment and support, frequent evaluation and titration of therapies, application of advanced monitoring technologies and extensive interpretation of multiple databases.   Discussed and co-managed patient care with PCCM-Hospitalist. Coordinated care with RT, RN and pharmacist.  Mechele CollinJane Xander Jutras, M.D. [facility] Pager: [phone] After hours pager: [phone]

## 2018-10-11 NOTE — Progress Notes (Signed)
Ramon Charity fundraiser at bs and updated/oriented pt in [place]. Spouse, Ms. Ardyth Harps, also called by Gerilyn Nestle an updated to patient condition. Spouse wishes for Ms. Erenest Blank, another pt at [facility], to receive updates on patient, as the fellow pt is her minister and a support and spiritual guide.

## 2018-10-12 DIAGNOSIS — J80 Acute respiratory distress syndrome: Secondary | ICD-10-CM | POA: Diagnosis not present

## 2018-10-12 DIAGNOSIS — N179 Acute kidney failure, unspecified: Secondary | ICD-10-CM | POA: Diagnosis not present

## 2018-10-12 DIAGNOSIS — J988 Other specified respiratory disorders: Secondary | ICD-10-CM | POA: Diagnosis not present

## 2018-10-12 DIAGNOSIS — U071 COVID-19: Secondary | ICD-10-CM | POA: Diagnosis not present

## 2018-10-12 DIAGNOSIS — L8990 Pressure ulcer of unspecified site, unspecified stage: Secondary | ICD-10-CM | POA: Diagnosis not present

## 2018-10-12 LAB — GLUCOSE, CAPILLARY
Glucose-Capillary: 102 mg/dL — ABNORMAL HIGH (ref 70–99)
Glucose-Capillary: 124 mg/dL — ABNORMAL HIGH (ref 70–99)
Glucose-Capillary: 145 mg/dL — ABNORMAL HIGH (ref 70–99)
Glucose-Capillary: 155 mg/dL — ABNORMAL HIGH (ref 70–99)
Glucose-Capillary: 161 mg/dL — ABNORMAL HIGH (ref 70–99)
Glucose-Capillary: 167 mg/dL — ABNORMAL HIGH (ref 70–99)
Glucose-Capillary: 176 mg/dL — ABNORMAL HIGH (ref 70–99)
Glucose-Capillary: 192 mg/dL — ABNORMAL HIGH (ref 70–99)
Glucose-Capillary: 196 mg/dL — ABNORMAL HIGH (ref 70–99)
Glucose-Capillary: 197 mg/dL — ABNORMAL HIGH (ref 70–99)
Glucose-Capillary: 202 mg/dL — ABNORMAL HIGH (ref 70–99)
Glucose-Capillary: 202 mg/dL — ABNORMAL HIGH (ref 70–99)
Glucose-Capillary: 211 mg/dL — ABNORMAL HIGH (ref 70–99)
Glucose-Capillary: 212 mg/dL — ABNORMAL HIGH (ref 70–99)
Glucose-Capillary: 218 mg/dL — ABNORMAL HIGH (ref 70–99)
Glucose-Capillary: 220 mg/dL — ABNORMAL HIGH (ref 70–99)
Glucose-Capillary: 221 mg/dL — ABNORMAL HIGH (ref 70–99)
Glucose-Capillary: 225 mg/dL — ABNORMAL HIGH (ref 70–99)
Glucose-Capillary: 246 mg/dL — ABNORMAL HIGH (ref 70–99)
Glucose-Capillary: 259 mg/dL — ABNORMAL HIGH (ref 70–99)
Glucose-Capillary: 92 mg/dL (ref 70–99)

## 2018-10-12 LAB — COMPREHENSIVE METABOLIC PANEL
ALT: 456 U/L — ABNORMAL HIGH (ref 0–44)
AST: 269 U/L — ABNORMAL HIGH (ref 15–41)
Albumin: 2.2 g/dL — ABNORMAL LOW (ref 3.5–5.0)
Alkaline Phosphatase: 51 U/L (ref 38–126)
Anion gap: 8 (ref 5–15)
BUN: 59 mg/dL — ABNORMAL HIGH (ref 6–20)
CO2: 27 mmol/L (ref 22–32)
Calcium: 7.7 mg/dL — ABNORMAL LOW (ref 8.9–10.3)
Chloride: 101 mmol/L (ref 98–111)
Creatinine, Ser: 1.61 mg/dL — ABNORMAL HIGH (ref 0.61–1.24)
GFR calc Af Amer: 59 mL/min — ABNORMAL LOW (ref >60)
GFR calc non Af Amer: 51 mL/min — ABNORMAL LOW (ref >60)
Glucose, Bld: 230 mg/dL — ABNORMAL HIGH (ref 70–99)
Potassium: 4.2 mmol/L (ref 3.5–5.1)
Sodium: 136 mmol/L (ref 135–145)
Total Bilirubin: 0.7 mg/dL (ref 0.3–1.2)
Total Protein: 6.3 g/dL — ABNORMAL LOW (ref 6.5–8.1)

## 2018-10-12 LAB — POCT I-STAT 7, (LYTES, BLD GAS, ICA,H+H)
Acid-Base Excess: 3 mmol/L — ABNORMAL HIGH (ref 0.0–2.0)
Acid-Base Excess: 1 mmol/L (ref 0.0–2.0)
Bicarbonate: 29.3 mmol/L — ABNORMAL HIGH (ref 20.0–28.0)
Bicarbonate: 27.7 mmol/L (ref 20.0–28.0)
Calcium, Ion: 1.13 mmol/L — ABNORMAL LOW (ref 1.15–1.40)
Calcium, Ion: 1.16 mmol/L (ref 1.15–1.40)
HCT: 38 % — ABNORMAL LOW (ref 39.0–52.0)
HCT: 39 % (ref 39.0–52.0)
Hemoglobin: 12.9 g/dL — ABNORMAL LOW (ref 13.0–17.0)
Hemoglobin: 13.3 g/dL (ref 13.0–17.0)
O2 Saturation: 99 %
O2 Saturation: 95 %
Patient temperature: 98.6
Potassium: 4.4 mmol/L (ref 3.5–5.1)
Potassium: 4.3 mmol/L (ref 3.5–5.1)
Sodium: 140 mmol/L (ref 135–145)
Sodium: 141 mmol/L (ref 135–145)
TCO2: 31 mmol/L (ref 22–32)
TCO2: 29 mmol/L (ref 22–32)
pCO2 arterial: 51.8 mmHg — ABNORMAL HIGH (ref 32.0–48.0)
pCO2 arterial: 52.6 mmHg — ABNORMAL HIGH (ref 32.0–48.0)
pH, Arterial: 7.36 (ref 7.350–7.450)
pH, Arterial: 7.33 — ABNORMAL LOW (ref 7.350–7.450)
pO2, Arterial: 166 mmHg — ABNORMAL HIGH (ref 83.0–108.0)
pO2, Arterial: 83 mmHg (ref 83.0–108.0)

## 2018-10-12 LAB — CBC
HCT: 41.5 % (ref 39.0–52.0)
Hemoglobin: 13.6 g/dL (ref 13.0–17.0)
MCH: 30.2 pg (ref 26.0–34.0)
MCHC: 32.8 g/dL (ref 30.0–36.0)
MCV: 92.2 fL (ref 80.0–100.0)
Platelets: 236 10*3/uL (ref 150–400)
RBC: 4.5 MIL/uL (ref 4.22–5.81)
RDW: 13.2 % (ref 11.5–15.5)
WBC: 18.5 10*3/uL — ABNORMAL HIGH (ref 4.0–10.5)
nRBC: 1.3 % — ABNORMAL HIGH (ref 0.0–0.2)

## 2018-10-12 LAB — PHOSPHORUS
Phosphorus: 3.3 mg/dL (ref 2.5–4.6)
Phosphorus: 3.8 mg/dL (ref 2.5–4.6)

## 2018-10-12 LAB — CULTURE, RESPIRATORY W GRAM STAIN
Culture: NORMAL
Gram Stain: NONE SEEN
Special Requests: NORMAL

## 2018-10-12 LAB — MAGNESIUM
Magnesium: 2.8 mg/dL — ABNORMAL HIGH (ref 1.7–2.4)
Magnesium: 3 mg/dL — ABNORMAL HIGH (ref 1.7–2.4)

## 2018-10-12 LAB — HEPATITIS PANEL, ACUTE
HCV Ab: 0.1 s/co ratio (ref 0.0–0.9)
Hep A IgM: NEGATIVE
Hep B C IgM: NEGATIVE
Hepatitis B Surface Ag: NEGATIVE

## 2018-10-12 LAB — PROCALCITONIN: Procalcitonin: 1.03 ng/mL

## 2018-10-12 LAB — PROTIME-INR
INR: 1.1 (ref 0.8–1.2)
Prothrombin Time: 14.4 seconds (ref 11.4–15.2)

## 2018-10-12 LAB — TRIGLYCERIDES: Triglycerides: 361 mg/dL — ABNORMAL HIGH (ref <150)

## 2018-10-12 MED ORDER — HYDROMORPHONE BOLUS VIA INFUSION
0.5000 mg | INTRAVENOUS | Status: DC | PRN
  Administered 2018-10-12 – 2018-10-16 (×8): 0.5 mg via INTRAVENOUS
  Filled 2018-10-12: qty 1

## 2018-10-12 MED ORDER — FUROSEMIDE 10 MG/ML IJ SOLN
40.0000 mg | Freq: Once | INTRAMUSCULAR | Status: AC
  Administered 2018-10-12: 40 mg via INTRAVENOUS
  Filled 2018-10-12: qty 4

## 2018-10-12 MED ORDER — SODIUM CHLORIDE 0.9 % IV SOLN
0.5000 mg/h | INTRAVENOUS | Status: DC
  Administered 2018-10-12: 2 mg/h via INTRAVENOUS
  Administered 2018-10-13: 3 mg/h via INTRAVENOUS
  Administered 2018-10-14 – 2018-10-15 (×2): 2 mg/h via INTRAVENOUS
  Administered 2018-10-16: 4 mg/h via INTRAVENOUS
  Filled 2018-10-12 (×5): qty 5

## 2018-10-12 MED ORDER — METHYLPREDNISOLONE SODIUM SUCC 125 MG IJ SOLR
50.0000 mg | Freq: Three times a day (TID) | INTRAMUSCULAR | Status: DC
  Administered 2018-10-12 – 2018-10-14 (×6): 50 mg via INTRAVENOUS
  Filled 2018-10-12 (×6): qty 2

## 2018-10-12 MED ORDER — METHYLPREDNISOLONE SODIUM SUCC 40 MG IJ SOLR: 40.0000 mg | Freq: Three times a day (TID) | INTRAMUSCULAR | Status: DC

## 2018-10-12 MED ORDER — CALCIUM GLUCONATE-NACL 1-0.675 GM/50ML-% IV SOLN
1.0000 g | Freq: Once | INTRAVENOUS | Status: AC
  Administered 2018-10-12: 1000 mg via INTRAVENOUS
  Filled 2018-10-12: qty 50

## 2018-10-12 NOTE — Progress Notes (Signed)
RT NOTE:  RT assisted RN with head turn. ETT secure.

## 2018-10-12 NOTE — Progress Notes (Signed)
RT NOTE:  RT assisted with turning patient to prone position. ETT secure.

## 2018-10-12 NOTE — Progress Notes (Addendum)
PROGRESS NOTE    [place]  UEK:[id number] DOB: 12/16/1970 DOA: 10/09/2018 PCP: Patient, No Pcp Per      Brief Narrative:  [name] is a 48 y.o. M with obesity who presented with malaise, lethargy and weakness, progressive over several days.    Reportedly, EMS found patient with SpO2 30s%, came up to 70% with CPAP en route.  In ER, CXR with bilateral opacities, was altered and intubated.        Assessment & Plan:  Coronavirus pneumonitis with acute hypoxic respiratory failure Septic shock from coronavirus In setting of ongoing 2020 COVID-19 pandemic.  Presented with lung failure, hypotension requiring pressors, and evidence of end organ damage.  Remdesivir and Actemra contraindicated in setting of transaminitis.    S/p convalescent plasma 6/3  Urine output low today, net positive on admission. -Continue Solu-Medrol, day 4 of 5, reduce to 1 mg/kg -Continue VTE PPx with Lovenox, ICU dosing -Obtain daily d-dimer, ferritin and CRP  -Repeat cautious Lasix  COVID-19 Labs Recent Labs    10/09/18 2020 10/10/18 0200  DDIMER 5.21* 2.15*  FERRITIN 6,924* 5,983*  CRP 28.4* 31.7*      Possible community acquired pneumonia Tracheal aspirate normal respiratory flora.  Procal 2 and trending up.  MRSA nasal swab negative. -Stop vancomycin -Continue cefepime, day 4 of 5  Acute renal failure Baseline Cr unknown.  Here 2.0 mg/dL on admission.  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  No alcohol history obtained.  CK mild.  INR normal.  Transaminases improved again today. -Follow viral serologies, HIV  Hyponatremia Sodium resolved -Trend Na   Elevated troponin Likely septic injury or demand mismatch.  Doubt plaque rupture.  Troponin elevation low and flat.  BNP normal.  Type 2 diabetes Unknown if preivously on  diabetes medicines, but A1c 10%.  Grossly stable, requiring greater than 200 units insulin over 24 hours. -Continue insulin drip  Pressure injury sacrum stage II present on arrival         MDM and disposition: The below labs and imaging reports were reviewed and summarized above.  Medication management as above.  The patient was admitted with hypoxic respiratory failure from [place].  He has also liver, renal and cardiac end organ damage.  High risk for mortality.   The patient is critically ill with multiple organ failure.  Critical care is necessary to treat or prevent imminent or life-threatening deterioration of sepsis, respiratory failure, cardiac failure, renal failure, it was exclusive of separately billable procedures and treating other patients.  The total critical care time spent by me was 38 minutes.  The time spent personally by me and evaluating the patient, evaluating the patient's response to treatment, ordering and review of laboratory studies, ordering and review of radiographic studies, ordering and performing treatments and interventions, and reevaluation of the patient's condition.      Ventilator best practice:  Diet: Tube feeds VAP protocol (if indicated): In place GI prophylaxis: Pantoprazole Glucose control: Insulin Mobility: Defer for now  DVT prophylaxis: Lovenox Code Status: FULL Family Communication: Wife     Consultants:   PCCM  Procedures:   ETT 6/3  Art line 6/3  Antimicrobials:   Vancomycin 6/2 >>  Cefepime 6/2 >>      Culture data:   6/2 blood culture x1 -- NGTD  6/3 respiratory culture -- pending     Subjective: Intubated and  sedated.  Occasionally restless and agitated, responds well to Versed.  Objective: Vitals:   10/12/18 1000 10/12/18 1100 10/12/18 1200 10/12/18 1300  BP:      Pulse: 60 63 77 65  Resp: (!) 35 (!) 35 (!) 27 (!) 23  Temp:      TempSrc:      SpO2: 99% 99% 95% 95%  Weight:      Height:         Intake/Output Summary (Last 24 hours) at 10/12/2018 1358 Last data filed at 10/12/2018 1340 Gross per 24 hour  Intake 2440.7 ml  Output 2090 ml  Net 350.7 ml   Filed Weights   10/10/18 0500 10/11/18 0500 10/12/18 0437  Weight: (!) 157.4 kg (!) 160.2 kg (!) 158.8 kg    Examination: General appearance: Obese adult male, prone, intubated and sedated. HEENT: Pupils equal.  Anicteric, conjunctiva pink, lids and lashes normal. No nasal deformity, discharge, epistaxis.  Lips moist.   Skin: Warm and dry.  no jaundice.  No suspicious rashes or lesions. Cardiac: Regular rate and rhythm, no murmurs, JVP not visible, no lower extremity edema. Respiratory: Prone on ventilator, ET tube in place, lung sounds diminished, but no rales or wheezes appreciated. Abdomen: Prone. MSK: No deformities or effusions. Neuro: Sedated.   Psych: Unable to assess.    Data Reviewed: I have personally reviewed following labs and imaging studies:  CBC: Recent Labs  Lab 10/09/18 1801  10/09/18 2020 10/10/18 0200  10/11/18 0400  10/11/18 1134 10/11/18 1645 10/12/18 0115 10/12/18 0515 10/12/18 1157  WBC 13.2*  --  13.7* 15.7*  --  17.6*  --   --   --  18.5*  --   --   NEUTROABS 10.0*  --  10.6* 11.7*  --   --   --   --   --   --   --   --   HGB 15.3   < > 14.9 15.4   < > 13.5   < > 12.6* 13.3 13.6 12.9* 13.3  HCT 44.3   < > 43.3 46.5   < > 41.8   < > 37.0* 39.0 41.5 38.0* 39.0  MCV 85.7  --  87.3 89.6  --  90.9  --   --   --  92.2  --   --   PLT 249  --  230 250  --  235  --   --   --  236  --   --    < > = values in this interval not displayed.   Basic Metabolic Panel: Recent Labs  Lab 10/09/18 2020 10/10/18 0200  10/11/18 0400  10/11/18 1116 10/11/18 1134 10/11/18 1645 10/12/18 0115 10/12/18 0515 10/12/18 1157  NA 123* 125*   < > 133*   < >  --  137 136 136 140 141  K 4.6 4.5   < > 4.3   < >  --  4.0 4.7 4.2 4.4 4.3  CL 86* 88*  --  97*  --   --   --   --  101  --   --   CO2 21* 23  --  24   --   --   --   --  27  --   --   GLUCOSE 319* 398*  --  158*  --   --   --   --  230*  --   --   BUN 34* 40*  --  38*  --   --   --   --  59*  --   --   CREATININE 2.00* 1.93*  --  1.55*  --   --   --   --  1.61*  --   --   CALCIUM 7.5* 7.4*  --  7.6*  --   --   --   --  7.7*  --   --   MG 2.0 2.2  --   --   --  2.8*  --   --  2.8*  --   --   PHOS 4.3 5.0*  --   --   --  4.3  --   --  3.3  --   --    < > = values in this interval not displayed.   GFR: Estimated Creatinine Clearance: 88.1 mL/min (A) (by C-G formula based on SCr of 1.61 mg/dL (H)). Liver Function Tests: Recent Labs  Lab 10/09/18 2020 10/10/18 0200 10/11/18 0400 10/12/18 0115  AST 1,510* 1,485* 595* 269*  ALT 564* 592* 526* 456*  ALKPHOS 62 64 56 51  BILITOT 1.1 0.8 0.6 0.7  PROT 6.9 7.0 6.8 6.3*  ALBUMIN 2.4* 2.6* 2.4* 2.2*   No results for input(s): LIPASE, AMYLASE in the last 168 hours. No results for input(s): AMMONIA in the last 168 hours. Coagulation Profile: Recent Labs  Lab 10/11/18 0400 10/12/18 0115  INR 1.1 1.1   Cardiac Enzymes: Recent Labs  Lab 10/10/18 0200 10/10/18 0600 10/10/18 1545  CKTOTAL 1,191*  --  1,256*  TROPONINI  --  0.04* <0.03   BNP (last 3 results) No results for input(s): PROBNP in the last 8760 hours. HbA1C: Recent Labs    10/11/18 0400  HGBA1C 10.0*   CBG: Recent Labs  Lab 10/12/18 0606 10/12/18 0706 10/12/18 0927 10/12/18 1132 10/12/18 1244  GLUCAP 145* 167* 161* 225* 202*   Lipid Profile: Recent Labs    10/11/18 0400 10/12/18 0115  TRIG 336* 361*   Thyroid Function Tests: No results for input(s): TSH, T4TOTAL, FREET4, T3FREE, THYROIDAB in the last 72 hours. Anemia Panel: Recent Labs    10/09/18 2020 10/10/18 0200  FERRITIN 6,924* 5,983*   Urine analysis: No results found for: COLORURINE, APPEARANCEUR, LABSPEC, PHURINE, GLUCOSEU, HGBUR, BILIRUBINUR, KETONESUR, PROTEINUR, UROBILINOGEN, NITRITE, LEUKOCYTESUR Sepsis Labs:  '@[email or web address]' (procalcitonin:4,lacticacidven:4)  ) Recent Results (from the past 240 hour(s))  SARS Coronavirus 2 (CEPHEID- [facility] Hosp Order     Status: Abnormal   Collection Time: 10/09/18  5:30 PM  Result Value Ref Range Status   SARS Coronavirus 2 POSITIVE (A) NEGATIVE Final    Comment: RESULT CALLED TO, READ BACK BY AND VERIFIED WITH: R HARDY RN 1847 10/09/18 A BROWNING (NOTE) If result is NEGATIVE SARS-CoV-2 target nucleic acids are NOT DETECTED. The SARS-CoV-2 RNA is generally detectable in upper and lower  respiratory specimens during the acute phase of infection. The lowest  concentration of SARS-CoV-2 viral copies this assay can detect is 250  copies / mL. A negative result does not preclude SARS-CoV-2 infection  and should not be used as the sole basis for treatment or other  patient management decisions.  A negative result may occur with  improper specimen collection / handling, submission of specimen other  than nasopharyngeal swab, presence of viral mutation(s) within the  areas targeted by this assay, and inadequate number of viral copies  (<250 copies / mL). A negative result must be combined with clinical  observations, patient history, and epidemiological information. If result is POSITIVE SARS-CoV-2 target nucleic acids  are DETECTED. The  SARS-CoV-2 RNA is generally detectable in upper and lower  respiratory specimens during the acute phase of infection.  Positive  results are indicative of active infection with SARS-CoV-2.  Clinical  correlation with patient history and other diagnostic information is  necessary to determine patient infection status.  Positive results do  not rule out bacterial infection or co-infection with other viruses. If result is PRESUMPTIVE POSTIVE SARS-CoV-2 nucleic acids MAY BE PRESENT.   A presumptive positive result was obtained on the submitted specimen  and confirmed on repeat testing.  While 2019 novel  coronavirus  (SARS-CoV-2) nucleic acids may be present in the submitted sample  additional confirmatory testing may be necessary for epidemiological  and / or clinical management purposes  to differentiate between  SARS-CoV-2 and other Sarbecovirus currently known to infect humans.  If clinically indicated additional testing with an alternate test  methodology [phone]) is a dvised. The SARS-CoV-2 RNA is generally  detectable in upper and lower respiratory specimens during the acute  phase of infection. The expected result is Negative. Fact Sheet for Patients:  StrictlyIdeas.no Fact Sheet for Healthcare Providers: BankingDealers.co.za This test is not yet approved or cleared by the [place] FDA and has been authorized for detection and/or diagnosis of SARS-CoV-2 by FDA under an Emergency Use Authorization (EUA).  This EUA will remain in effect (meaning this test can be used) for the duration of the COVID-19 declaration under Section 564(b)(1) of the Act, 21 U.S.C. section 360bbb-3(b)(1), unless the authorization is terminated or revoked sooner. Performed at [facility], [place] [address] [place], [place] [address]   Blood Culture (routine x 2)     Status: None (Preliminary result)   Collection Time: 10/09/18  6:09 PM  Result Value Ref Range Status   Specimen Description BLOOD RIGHT ANTECUBITAL  Final   Special Requests   Final    BOTTLES DRAWN AEROBIC AND ANAEROBIC Blood Culture adequate volume   Culture   Final    NO GROWTH 3 DAYS Performed at [facility], [place] [address], [place], [place] [address]    Report Status PENDING  Incomplete  Culture, respiratory (non-expectorated)     Status: None   Collection Time: 10/10/18  9:04 AM  Result Value Ref Range Status   Specimen Description   Final    TRACHEAL ASPIRATE Performed at [facility] [address], [place], [place] [address]    Special  Requests   Final    Normal Performed at [facility], [place] [address] [place], [place] [address]    Gram Stain NO WBC SEEN NO ORGANISMS SEEN   Final   Culture   Final    RARE Consistent with normal respiratory flora. Performed at [facility], [place] [address] [place], [place] [address]    Report Status 10/12/2018 FINAL  Final  MRSA PCR Screening     Status: None   Collection Time: 10/11/18 12:31 PM  Result Value Ref Range Status   MRSA by PCR NEGATIVE NEGATIVE Final    Comment:        The GeneXpert MRSA Assay (FDA approved for NASAL specimens only), is one component of a comprehensive MRSA colonization surveillance program. It is not intended to diagnose MRSA infection nor to guide or monitor treatment for MRSA infections. Performed at [facility], [place] [address], [place], [place] [address]          Radiology Studies: No results  found.      Scheduled Meds: . chlorhexidine gluconate (MEDLINE KIT)  15 mL Mouth Rinse BID  . enoxaparin (LOVENOX) injection  75 mg Subcutaneous Q12H  . feeding supplement (PRO-STAT SUGAR FREE 64)  60 mL Per Tube TID  . mouth rinse  15 mL Mouth Rinse 10 times per day  . methylPREDNISolone (SOLU-MEDROL) injection  50 mg Intravenous Q8H  . nutrition supplement (JUVEN)  1 packet Per Tube BID BM  . pantoprazole (PROTONIX) IV  40 mg Intravenous QHS   Continuous Infusions: . sodium chloride    . sodium chloride 10 mL/hr at 10/12/18 1300  . ceFEPime (MAXIPIME) IV Stopped (10/12/18 1040)  . feeding supplement (VITAL HIGH PROTEIN) 45 mL/hr at 10/12/18 0800  . HYDROmorphone 2 mg/hr (10/12/18 1300)  . insulin 13.8 mL/hr at 10/12/18 1300  . midazolam 7 mg/hr (10/12/18 1300)  . norepinephrine (LEVOPHED) Adult infusion Stopped (10/10/18 1454)     LOS: 3 days    Time spent:    The assigned patient code is [id number] Unique Dashboard Link: [email or web address]      During this encounter: Patient Isolation: Airborne, contact, droplet HCP PPE: Face shield, head covering, N95, gown, gloves, and shoe covers    Edwin Dada, MD [facility] 10/12/2018, 1:58 PM     Please page through AMION:  [email or web address] Password [email or web address] If 7PM-7AM, please contact night-coverage

## 2018-10-12 NOTE — Progress Notes (Signed)
Spoke with wife and provided update through translation assistance from son. Wife grateful for communication with nurse. States to call her with any updates and her children can assist in translation

## 2018-10-12 NOTE — Progress Notes (Signed)
Pt moved into prone position with respiratory present to manage airway. No issues. Tolerated well.   Will draw ABG in an hour.  Per protocol pt will stay proned until ~20:30 this evening.

## 2018-10-12 NOTE — Progress Notes (Signed)
NAME:  Roy Andrews, MRN:  [id number], DOB:  May 03, 1971, LOS: 3 ADMISSION DATE:  10/09/2018, CONSULTATION DATE:  10/09/18   Brief History   48 year old man with obesity, HTN, DM presenting with lethargy found to be in acute hypoxemic respiratory failure intubated in ER.  CXR shows bilateral infiltrates, COVID positive.  History of present illness   48 year old man with obesity, HTN, DM presenting with lethargy found to be in acute hypoxemic respiratory failure intubated in ER.  CXR shows bilateral infiltrates, COVID positive.  Past Medical History  DM HTN  Significant Hospital Events   6/2 Intubation 6/3- Proned 6/4- Prone and diuresis 6/5- Prone and diuresis Consults:  10/09/18 PCCM  Procedures:  10/09/18 Intubation  Significant Diagnostic Tests:  10/09/18 CXR with bilateral infiltrates  Micro Data:  BCX 6/2 - pending Trach asp 6/3 - normal flora  Antimicrobials:  Vanc 6/2>6/4 Cefepime 6/2> Convalescent plasma 6/3  Interval Events  Proned. Weaned FIO2 to 60% and 10. Easily agitated despite current sedation regimen  Objective   Blood pressure (!) 118/55, pulse 60, temperature 98.6 F (37 C), temperature source Axillary, resp. rate (!) 35, height  (1.803 m), weight (!) 158.8 kg, SpO2 99 %.    Vent Mode: PRVC FiO2 (%):  [60 %-80 %] 60 % Set Rate:  [35 bmp] 35 bmp Vt Set:  [450 mL] 450 mL PEEP:  [10 cmH20-12 cmH20] 10 cmH20 Plateau Pressure:  [24 cmH20-25 cmH20] 24 cmH20   Intake/Output Summary (Last 24 hours) at 10/12/2018 1233 Last data filed at 10/12/2018 1221 Gross per 24 hour  Intake 1841.48 ml  Output 2090 ml  Net -248.52 ml   Filed Weights   10/10/18 0500 10/11/18 0500 10/12/18 0437  Weight: (!) 157.4 kg (!) 160.2 kg (!) 158.8 kg   Physical Exam: General: Obese, critically ill-appearing, sedated however easily awakened HENT: [place], AT, ETT in place Eyes: EOMI, no scleral icterus Respiratory: Clear to auscultation bilaterally.  No crackles,  wheezing or rales Cardiovascular: RRR, -M/R/G, no JVD GI: BS+, soft, nontender Extremities:-Edema,-tenderness Neuro: Sedated, does not follow commands, moves extremities x 4 GU: Foley in place  Resolved Hospital Problem list   Septic shock Ischemic hepatitis  Assessment & Plan:   ARDS secondary to COVID-19: Improved oxygenation with proning Not a candidate for tocilizumab or remdesivir due to transaminitis S/p convalescent plasma Reviewed ABG. P:F 138. Continue proning.  Titrate PEEP/FIO2 per [facility] protocol for goal SpO2 88-95% or pO2 55-80 Scheduled steroids D3 Gentle diuresis Continue Cefepime for five-day course (end 6/6) VAP  Sedation on mechanical ventilation Transition from Fentanyl to Dilaudid gtt Continue Versed gtt RASS goal -4  AKI: Unknown baseline. Adequate UOP Monitor UOP/Cr Avoid nephrotoxic agents  Hyperglycemia Continue insulin gtt per [facility]  Best practice:  Diet: Start TF Pain/Anxiety/Delirium protocol (if indicated): fentanyl, versed gtt titrated to RASS -4 VAP protocol (if indicated): Yes DVT prophylaxis: Lovenox  BID per COVID protocol GI prophylaxis: protonix Glucose control: insulin gtt Mobility: bedrest Code Status: full Family Communication: per [facility] Disposition: ICU  Labs   CBC: Recent Labs  Lab 10/09/18 1801  10/09/18 2020 10/10/18 0200  10/11/18 0400  10/11/18 1134 10/11/18 1645 10/12/18 0115 10/12/18 0515 10/12/18 1157  WBC 13.2*  --  13.7* 15.7*  --  17.6*  --   --   --  18.5*  --   --   NEUTROABS 10.0*  --  10.6* 11.7*  --   --   --   --   --   --   --   --  HGB 15.3   < > 14.9 15.4   < > 13.5   < > 12.6* 13.3 13.6 12.9* 13.3  HCT 44.3   < > 43.3 46.5   < > 41.8   < > 37.0* 39.0 41.5 38.0* 39.0  MCV 85.7  --  87.3 89.6  --  90.9  --   --   --  92.2  --   --   PLT 249  --  230 250  --  235  --   --   --  236  --   --    < > = values in this interval not displayed.    Basic Metabolic Panel: Recent Labs  Lab  10/09/18 2020 10/10/18 0200  10/11/18 0400  10/11/18 1116 10/11/18 1134 10/11/18 1645 10/12/18 0115 10/12/18 0515 10/12/18 1157  NA 123* 125*   < > 133*   < >  --  137 136 136 140 141  K 4.6 4.5   < > 4.3   < >  --  4.0 4.7 4.2 4.4 4.3  CL 86* 88*  --  97*  --   --   --   --  101  --   --   CO2 21* 23  --  24  --   --   --   --  27  --   --   GLUCOSE 319* 398*  --  158*  --   --   --   --  230*  --   --   BUN 34* 40*  --  38*  --   --   --   --  59*  --   --   CREATININE 2.00* 1.93*  --  1.55*  --   --   --   --  1.61*  --   --   CALCIUM 7.5* 7.4*  --  7.6*  --   --   --   --  7.7*  --   --   MG 2.0 2.2  --   --   --  2.8*  --   --  2.8*  --   --   PHOS 4.3 5.0*  --   --   --  4.3  --   --  3.3  --   --    < > = values in this interval not displayed.   GFR: Estimated Creatinine Clearance: 88.1 mL/min (A) (by C-G formula based on SCr of 1.61 mg/dL (H)). Recent Labs  Lab 10/09/18 1801 10/09/18 2001 10/09/18 2020 10/10/18 0200 10/10/18 0600 10/10/18 1545 10/11/18 0400 10/12/18 0115  PROCALCITON  --   --   --   --   --  1.92 2.10 1.03  WBC 13.2*  --  13.7* 15.7*  --   --  17.6* 18.5*  LATICACIDVEN 4.2* 2.5*  --   --  1.9  --   --   --     Liver Function Tests: Recent Labs  Lab 10/09/18 2020 10/10/18 0200 10/11/18 0400 10/12/18 0115  AST 1,510* 1,485* 595* 269*  ALT 564* 592* 526* 456*  ALKPHOS 62 64 56 51  BILITOT 1.1 0.8 0.6 0.7  PROT 6.9 7.0 6.8 6.3*  ALBUMIN 2.4* 2.6* 2.4* 2.2*   No results for input(s): LIPASE, AMYLASE in the last 168 hours. No results for input(s): AMMONIA in the last 168 hours.  ABG    Component Value Date/Time   PHART 7.330 (L) 10/12/2018 1157  PCO2ART 52.6 (H) 10/12/2018 1157   PO2ART 83.0 10/12/2018 1157   HCO3 27.7 10/12/2018 1157   TCO2 29 10/12/2018 1157   ACIDBASEDEF 1.0 10/11/2018 1134   O2SAT 95.0 10/12/2018 1157     Coagulation Profile: Recent Labs  Lab 10/11/18 0400 10/12/18 0115  INR 1.1 1.1    Cardiac  Enzymes: Recent Labs  Lab 10/10/18 0200 10/10/18 0600 10/10/18 1545  CKTOTAL 1,191*  --  1,256*  TROPONINI  --  0.04* <0.03    HbA1C: Hgb A1c MFr Bld  Date/Time Value Ref Range Status  10/11/2018 04:00 AM 10.0 (H) 4.8 - 5.6 % Final    Comment:    (NOTE) Pre diabetes:          5.7%-6.4% Diabetes:              >6.4% Glycemic control for   <7.0% adults with diabetes     CBG: Recent Labs  Lab 10/12/18 0513 10/12/18 0606 10/12/18 0706 10/12/18 0927 10/12/18 1132  GLUCAP 197* 145* 167* 161* 225*   Critical Care Time: 35 minutes  The patient is critically ill with multiple organ systems failure and requires high complexity decision making for assessment and support, frequent evaluation and titration of therapies, application of advanced monitoring technologies and extensive interpretation of multiple databases.   Discussed and co-managed patient care with PCCM-Hospitalist. Coordinated care with RT, RN and pharmacist.  Mechele CollinJane Sakshi Sermons, M.D. [facility] Pager: [phone] After hours pager: [phone]

## 2018-10-12 NOTE — Progress Notes (Signed)
RT NOTE:  RT assisted with turning patient from prone to supine position. ETT secured.

## 2018-10-12 NOTE — Plan of Care (Signed)
  Problem: Education: Goal: Knowledge of General Education information will improve Description Including pain rating scale, medication(s)/side effects and non-pharmacologic comfort measures Outcome: Progressing   Problem: Health Behavior/Discharge Planning: Goal: Ability to manage health-related needs will improve Outcome: Progressing   Problem: Clinical Measurements: Goal: Ability to maintain clinical measurements within normal limits will improve Outcome: Progressing Goal: Will remain free from infection Outcome: Progressing Goal: Diagnostic test results will improve Outcome: Progressing Goal: Respiratory complications will improve Outcome: Progressing Goal: Cardiovascular complication will be avoided Outcome: Progressing   Problem: Activity: Goal: Risk for activity intolerance will decrease Outcome: Progressing   Problem: Nutrition: Goal: Adequate nutrition will be maintained Outcome: Progressing   Problem: Coping: Goal: Level of anxiety will decrease Outcome: Progressing   Problem: Elimination: Goal: Will not experience complications related to bowel motility Outcome: Progressing Goal: Will not experience complications related to urinary retention Outcome: Progressing   Problem: Pain Managment: Goal: General experience of comfort will improve Outcome: Progressing   Problem: Safety: Goal: Ability to remain free from injury will improve Outcome: Progressing   Problem: Skin Integrity: Goal: Risk for impaired skin integrity will decrease Outcome: Progressing   Problem: Education: Goal: Knowledge of risk factors and measures for prevention of condition will improve Outcome: Progressing   Problem: Coping: Goal: Psychosocial and spiritual needs will be supported Outcome: Progressing   Problem: Respiratory: Goal: Will maintain a patent airway Outcome: Progressing Goal: Complications related to the disease process, condition or treatment will be avoided or  minimized Outcome: Progressing   Problem: Activity: Goal: Ability to tolerate increased activity will improve Outcome: Progressing   Problem: Respiratory: Goal: Ability to maintain a clear airway and adequate ventilation will improve Outcome: Progressing   Problem: Role Relationship: Goal: Method of communication will improve Outcome: Progressing   Problem: Education: Goal: Ability to describe self-care measures that may prevent or decrease complications (Diabetes Survival Skills Education) will improve Outcome: Progressing Goal: Individualized Educational Video(s) Outcome: Progressing   Problem: Coping: Goal: Ability to adjust to condition or change in health will improve Outcome: Progressing   Problem: Fluid Volume: Goal: Ability to maintain a balanced intake and output will improve Outcome: Progressing   Problem: Health Behavior/Discharge Planning: Goal: Ability to identify and utilize available resources and services will improve Outcome: Progressing Goal: Ability to manage health-related needs will improve Outcome: Progressing   Problem: Metabolic: Goal: Ability to maintain appropriate glucose levels will improve Outcome: Progressing   Problem: Nutritional: Goal: Maintenance of adequate nutrition will improve Outcome: Progressing Goal: Progress toward achieving an optimal weight will improve Outcome: Progressing   Problem: Skin Integrity: Goal: Risk for impaired skin integrity will decrease Outcome: Progressing   Problem: Tissue Perfusion: Goal: Adequacy of tissue perfusion will improve Outcome: Progressing

## 2018-10-13 DIAGNOSIS — J988 Other specified respiratory disorders: Secondary | ICD-10-CM | POA: Diagnosis not present

## 2018-10-13 DIAGNOSIS — U071 COVID-19: Secondary | ICD-10-CM | POA: Diagnosis not present

## 2018-10-13 DIAGNOSIS — J80 Acute respiratory distress syndrome: Secondary | ICD-10-CM | POA: Diagnosis not present

## 2018-10-13 LAB — CBC
HCT: 44.5 % (ref 39.0–52.0)
Hemoglobin: 13.6 g/dL (ref 13.0–17.0)
MCH: 28.8 pg (ref 26.0–34.0)
MCHC: 30.6 g/dL (ref 30.0–36.0)
MCV: 94.1 fL (ref 80.0–100.0)
Platelets: 271 10*3/uL (ref 150–400)
RBC: 4.73 MIL/uL (ref 4.22–5.81)
RDW: 13.4 % (ref 11.5–15.5)
WBC: 14.8 10*3/uL — ABNORMAL HIGH (ref 4.0–10.5)
nRBC: 1.3 % — ABNORMAL HIGH (ref 0.0–0.2)

## 2018-10-13 LAB — COMPREHENSIVE METABOLIC PANEL
ALT: 289 U/L — ABNORMAL HIGH (ref 0–44)
AST: 74 U/L — ABNORMAL HIGH (ref 15–41)
Albumin: 2.3 g/dL — ABNORMAL LOW (ref 3.5–5.0)
Alkaline Phosphatase: 52 U/L (ref 38–126)
Anion gap: 7 (ref 5–15)
BUN: 72 mg/dL — ABNORMAL HIGH (ref 6–20)
CO2: 28 mmol/L (ref 22–32)
Calcium: 8.5 mg/dL — ABNORMAL LOW (ref 8.9–10.3)
Chloride: 107 mmol/L (ref 98–111)
Creatinine, Ser: 1.46 mg/dL — ABNORMAL HIGH (ref 0.61–1.24)
GFR calc Af Amer: >60 mL/min (ref >60)
GFR calc non Af Amer: 57 mL/min — ABNORMAL LOW (ref >60)
Glucose, Bld: 181 mg/dL — ABNORMAL HIGH (ref 70–99)
Potassium: 4.3 mmol/L (ref 3.5–5.1)
Sodium: 142 mmol/L (ref 135–145)
Total Bilirubin: 0.4 mg/dL (ref 0.3–1.2)
Total Protein: 6.7 g/dL (ref 6.5–8.1)

## 2018-10-13 LAB — GLUCOSE, CAPILLARY
Glucose-Capillary: 129 mg/dL — ABNORMAL HIGH (ref 70–99)
Glucose-Capillary: 131 mg/dL — ABNORMAL HIGH (ref 70–99)
Glucose-Capillary: 135 mg/dL — ABNORMAL HIGH (ref 70–99)
Glucose-Capillary: 152 mg/dL — ABNORMAL HIGH (ref 70–99)
Glucose-Capillary: 170 mg/dL — ABNORMAL HIGH (ref 70–99)
Glucose-Capillary: 171 mg/dL — ABNORMAL HIGH (ref 70–99)
Glucose-Capillary: 172 mg/dL — ABNORMAL HIGH (ref 70–99)
Glucose-Capillary: 173 mg/dL — ABNORMAL HIGH (ref 70–99)
Glucose-Capillary: 183 mg/dL — ABNORMAL HIGH (ref 70–99)
Glucose-Capillary: 188 mg/dL — ABNORMAL HIGH (ref 70–99)
Glucose-Capillary: 188 mg/dL — ABNORMAL HIGH (ref 70–99)
Glucose-Capillary: 189 mg/dL — ABNORMAL HIGH (ref 70–99)
Glucose-Capillary: 193 mg/dL — ABNORMAL HIGH (ref 70–99)
Glucose-Capillary: 196 mg/dL — ABNORMAL HIGH (ref 70–99)
Glucose-Capillary: 214 mg/dL — ABNORMAL HIGH (ref 70–99)
Glucose-Capillary: 215 mg/dL — ABNORMAL HIGH (ref 70–99)
Glucose-Capillary: 220 mg/dL — ABNORMAL HIGH (ref 70–99)
Glucose-Capillary: 222 mg/dL — ABNORMAL HIGH (ref 70–99)
Glucose-Capillary: 243 mg/dL — ABNORMAL HIGH (ref 70–99)

## 2018-10-13 LAB — PROTIME-INR
INR: 1.2 (ref 0.8–1.2)
Prothrombin Time: 14.6 seconds (ref 11.4–15.2)

## 2018-10-13 LAB — TRIGLYCERIDES: Triglycerides: 292 mg/dL — ABNORMAL HIGH (ref <150)

## 2018-10-13 LAB — C-REACTIVE PROTEIN: CRP: 4.6 mg/dL — ABNORMAL HIGH (ref <1.0)

## 2018-10-13 LAB — FERRITIN: Ferritin: 1105 ng/mL — ABNORMAL HIGH (ref 24–336)

## 2018-10-13 LAB — D-DIMER, QUANTITATIVE: D-Dimer, Quant: 2.04 ug/mL-FEU — ABNORMAL HIGH (ref 0.00–0.50)

## 2018-10-13 MED ORDER — FUROSEMIDE 10 MG/ML IJ SOLN
40.0000 mg | Freq: Once | INTRAMUSCULAR | Status: AC
  Administered 2018-10-13: 40 mg via INTRAVENOUS
  Filled 2018-10-13: qty 4

## 2018-10-13 MED ORDER — OXYCODONE HCL 5 MG PO TABS
5.0000 mg | ORAL_TABLET | Freq: Three times a day (TID) | ORAL | Status: DC
  Administered 2018-10-13 – 2018-10-19 (×13): 5 mg via ORAL
  Filled 2018-10-13 (×13): qty 1

## 2018-10-13 NOTE — Progress Notes (Signed)
NAME:  Roy Andrews, MRN:  [id number], DOB:  Aug 07, 1970, LOS: 4 ADMISSION DATE:  10/09/2018, CONSULTATION DATE: October 09, 2018 REFERRING MD: Dr. Maryfrances Bunnellanford, CHIEF COMPLAINT: Dyspnea  Brief History   48 year old male with a past medical history significant for obesity hypertension and diabetes admitted with ARDS due to COVID-19 pneumonia.    Past Medical History  Obesity Hypertension Diabetes mellitus type 2  Significant Hospital Events   6/2 Intubation 6/3- Proned 6/4- Prone and diuresis 6/5- Prone and diuresis  Consults:  10/09/18 PCCM  Procedures:  10/09/18 ETT > 6/2 L IJ CVL >   Significant Diagnostic Tests:  10/09/18 CXR with bilateral infiltrates  Micro Data:  BCX 6/2 - pending Trach asp 6/3 - normal flora  Antimicrobials/COVID treatment:  Vanc 6/2>6/4 Cefepime 6/2> Convalescent plasma 6/3  Interim history/subjective:  No major changes overnight, some weaning and PEEP and FiO2 over the last 2 to 3 days, making urine normally, renal function improving  Objective   Blood pressure (!) 113/47, pulse (!) 54, temperature (!) 100.7 F (38.2 C), temperature source Axillary, resp. rate (!) 35, height 5\' 11"  (1.803 m), weight (!) 157.5 kg, SpO2 93 %.    Vent Mode: PRVC FiO2 (%):  [50 %-60 %] 50 % Set Rate:  [35 bmp-36 bmp] 35 bmp Vt Set:  [450 mL] 450 mL PEEP:  [8 cmH20] 8 cmH20 Plateau Pressure:  [22 cmH20] 22 cmH20   Intake/Output Summary (Last 24 hours) at 10/13/2018 0759 Last data filed at 10/13/2018 0700 Gross per 24 hour  Intake 2722.37 ml  Output 4010 ml  Net -1287.63 ml   Filed Weights   10/11/18 0500 10/12/18 0437 10/13/18 0500  Weight: (!) 160.2 kg (!) 158.8 kg (!) 157.5 kg    Examination:  General:  In bed on vent HENT: NCAT ETT in place PULM: CTA B, vent supported breathing CV: RRR, no mgr GI: BS+, soft, nontender MSK: normal bulk and tone Neuro: sedated on vent   Resolved Hospital Problem list     Assessment & Plan:  ARDS  secondary to COVID-19 pneumonia: Improving oxygenation this week Continue prone positioning Repeat ABG in a.m., target PaO2 to FiO2 ratio greater than 150 Repeat chest x-ray in a.m. Diurese as able, target CVP less than 4 Continue Solu-Medrol, 1 mg/kg Ventilator associated pneumonia prevention protocol Maintain tidal volume between 6 and 8 cc/kg of ideal body weight  Sedation, on mechanical ventilation, need for ventilator synchrony Adjust RA SS score to -3 given prone positioning and need for high sedation Continue Dilaudid and Versed infusions Add oxycodone  AKI: Improving Monitor BMET and UOP Replace electrolytes as needed   Best practice:  Diet: continue tube feeding Pain/Anxiety/Delirium protocol (if indicated): RASS goal -3 VAP protocol (if indicated): yes DVT prophylaxis: lovenox bid per COVID Protocol GI prophylaxis: Pantoprazole for stress ulcer prophylaxis Glucose control: per TRH Mobility: bed rest Code Status: full Family Communication: updated his wife  Disposition: remain in icu  Labs   CBC: Recent Labs  Lab 10/09/18 1801  10/09/18 2020 10/10/18 0200  10/11/18 0400  10/11/18 1645 10/12/18 0115 10/12/18 0515 10/12/18 1157 10/13/18 0310  WBC 13.2*  --  13.7* 15.7*  --  17.6*  --   --  18.5*  --   --  14.8*  NEUTROABS 10.0*  --  10.6* 11.7*  --   --   --   --   --   --   --   --   HGB 15.3   < >  14.9 15.4   < > 13.5   < > 13.3 13.6 12.9* 13.3 13.6  HCT 44.3   < > 43.3 46.5   < > 41.8   < > 39.0 41.5 38.0* 39.0 44.5  MCV 85.7  --  87.3 89.6  --  90.9  --   --  92.2  --   --  94.1  PLT 249  --  230 250  --  235  --   --  236  --   --  271   < > = values in this interval not displayed.    Basic Metabolic Panel: Recent Labs  Lab 10/09/18 2020 10/10/18 0200  10/11/18 0400  10/11/18 1116  10/11/18 1645 10/12/18 0115 10/12/18 0515 10/12/18 1157 10/12/18 1815 10/13/18 0310  NA 123* 125*   < > 133*   < >  --    < > 136 136 140 141  --  142  K 4.6  4.5   < > 4.3   < >  --    < > 4.7 4.2 4.4 4.3  --  4.3  CL 86* 88*  --  97*  --   --   --   --  101  --   --   --  107  CO2 21* 23  --  24  --   --   --   --  27  --   --   --  28  GLUCOSE 319* 398*  --  158*  --   --   --   --  230*  --   --   --  181*  BUN 34* 40*  --  38*  --   --   --   --  59*  --   --   --  72*  CREATININE 2.00* 1.93*  --  1.55*  --   --   --   --  1.61*  --   --   --  1.46*  CALCIUM 7.5* 7.4*  --  7.6*  --   --   --   --  7.7*  --   --   --  8.5*  MG 2.0 2.2  --   --   --  2.8*  --   --  2.8*  --   --  3.0*  --   PHOS 4.3 5.0*  --   --   --  4.3  --   --  3.3  --   --  3.8  --    < > = values in this interval not displayed.   GFR: Estimated Creatinine Clearance: 96.8 mL/min (A) (by C-G formula based on SCr of 1.46 mg/dL (H)). Recent Labs  Lab 10/09/18 1801 10/09/18 2001  10/10/18 0200 10/10/18 0600 10/10/18 1545 10/11/18 0400 10/12/18 0115 10/13/18 0310  PROCALCITON  --   --   --   --   --  1.92 2.10 1.03  --   WBC 13.2*  --    < > 15.7*  --   --  17.6* 18.5* 14.8*  LATICACIDVEN 4.2* 2.5*  --   --  1.9  --   --   --   --    < > = values in this interval not displayed.    Liver Function Tests: Recent Labs  Lab 10/09/18 2020 10/10/18 0200 10/11/18 0400 10/12/18 0115 10/13/18 0310  AST 1,510* 1,485* 595* 269* 74*  ALT 564* 592* 526*  456* 289*  ALKPHOS 62 64 56 51 52  BILITOT 1.1 0.8 0.6 0.7 0.4  PROT 6.9 7.0 6.8 6.3* 6.7  ALBUMIN 2.4* 2.6* 2.4* 2.2* 2.3*   No results for input(s): LIPASE, AMYLASE in the last 168 hours. No results for input(s): AMMONIA in the last 168 hours.  ABG    Component Value Date/Time   PHART 7.330 (L) 10/12/2018 1157   PCO2ART 52.6 (H) 10/12/2018 1157   PO2ART 83.0 10/12/2018 1157   HCO3 27.7 10/12/2018 1157   TCO2 29 10/12/2018 1157   ACIDBASEDEF 1.0 10/11/2018 1134   O2SAT 95.0 10/12/2018 1157     Coagulation Profile: Recent Labs  Lab 10/11/18 0400 10/12/18 0115 10/13/18 0310  INR 1.1 1.1 1.2     Cardiac Enzymes: Recent Labs  Lab 10/10/18 0200 10/10/18 0600 10/10/18 1545  CKTOTAL 1,191*  --  1,256*  TROPONINI  --  0.04* <0.03    HbA1C: Hgb A1c MFr Bld  Date/Time Value Ref Range Status  10/11/2018 04:00 AM 10.0 (H) 4.8 - 5.6 % Final    Comment:    (NOTE) Pre diabetes:          5.7%-6.4% Diabetes:              >6.4% Glycemic control for   <7.0% adults with diabetes     CBG: Recent Labs  Lab 10/13/18 0058 10/13/18 0306 10/13/18 0409 10/13/18 0610 10/13/18 0716  GLUCAP 173* 170* 188* 131* 129*     Critical care time: 40 minutes    Roselie Awkward, MD [facility] Pager: [phone] Cell: [phone] If no response, call [phone]

## 2018-10-13 NOTE — Progress Notes (Signed)
PROGRESS NOTE    Garceno  AFB:[id number] DOB: 10-01-1970 DOA: 10/09/2018 PCP: Patient, No Pcp Per      Brief Narrative:  [name] is a 48 y.o. M with obesity who presented with malaise, lethargy and weakness, progressive over several days.    Reportedly, EMS found patient with SpO2 30s%, came up to 70% with CPAP en route.  In ER, CXR with bilateral opacities, was altered and intubated.        Assessment & Plan:  Coronavirus pneumonitis with acute hypoxic respiratory failure Septic shock from coronavirus In setting of ongoing 2020 COVID-19 pandemic.  Presented with lung failure, hypotension requiring pressors, and evidence of end organ damage.  Remdesivir and Actemra contraindicated in setting of transaminitis.    S/p convalescent plasma 6/3  Oxygenation improving.   -Finish Solu-Medrol, day 5 of 5, today  -Continue VTE PPx with Lovenox, ICU dosing -Continue daily d-dimer, ferritin and CRP   COVID-19 Labs Recent Labs    10/13/18 0310  DDIMER 2.04*  FERRITIN 1,105*  CRP 4.6*       Possible community acquired pneumonia Tracheal aspirate normal respiratory flora.  Procal 2 and trending up.  MRSA nasal swab negative. -Continue cefepime, day 5 of 6  Acute renal failure Baseline Cr unknown.  Here 2.0 mg/dL on admission.  Moderate improvement today. 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  No alcohol history obtained.  CK mild.  INR normal.  Transaminases improving  HIV negative.  Hep seros pending. -Follow viral serologies  Hyponatremia Normalized  Elevated troponin Likely septic injury or demand mismatch.  Doubt plaque rupture.  Troponin elevation low and flat.  BNP normal.  Type 2 diabetes Unknown if preivously on diabetes medicines, but A1c 10%.  Glucose stable, high insulin needs. -Continue insulin drip  Pressure injury sacrum  stage II present on arrival         MDM and disposition: The below labs and imaging reports were reviewed and summarized above.  Medication management as above  The patient was admitted with hypoxic respiratory failure from [place].  He has also liver, renal and cardiac end organ damage.  High risk for mortality.   There is a high level of medical risk associated with this encounter, an extensive number of diagnoses or treatment options were managed.      Ventilator best practice:  Diet: Tube feeds VAP protocol (if indicated): In place GI prophylaxis: Pantoprazole Glucose control: Insulin Mobility: Defer for now  DVT prophylaxis: Lovenox Code Status: FULL     Consultants:   PCCM  Procedures:   ETT 6/3  Art line 6/3  Antimicrobials:   Vancomycin 6/2 >> 6/3  Cefepime 6/2 >>       Culture data:   6/2 blood culture x1 -- NGTD  6/3 respiratory culture -- normal respiratory flora     Subjective: Intubated and sedated.    Objective: Vitals:   10/13/18 0442 10/13/18 0500 10/13/18 0600 10/13/18 0700  BP: (!) 113/47     Pulse: 60 (!) 58 (!) 55 (!) 54  Resp: (!) 35 (!) 35 (!) 35 (!) 35  Temp:      TempSrc:      SpO2: 92% 92% 92% 93%  Weight:  (!) 157.5 kg    Height:  _0  (1.803 m)      Intake/Output Summary (Last 24 hours) at 10/13/2018 0917 Last data filed at 10/13/2018 0700  Gross per 24 hour  Intake 2534.61 ml  Output 3995 ml  Net -1460.39 ml   Filed Weights   10/11/18 0500 10/12/18 0437 10/13/18 0500  Weight: (!) 160.2 kg (!) 158.8 kg (!) 157.5 kg    Examination: General appearance: Obese adult male, prone, intubated and sedated. HEENT:    Skin: Skin warm and dry, no jaundice, no suspicious rashes or lesions. Cardiac: Regular rate and rhythm, no murmurs, JVP not visible, no lower extremity edema. Respiratory: Prone, on ventilator.  Lung sounds diminished.  No wheezes or rales appreciated. Abdomen: In prone position  MSK: Normal  muscle bulk and tone. Neuro: Sedated. Psych: Unable to assess.    Data Reviewed: I have personally reviewed following labs and imaging studies:  CBC: Recent Labs  Lab 10/09/18 1801  10/09/18 2020 10/10/18 0200  10/11/18 0400  10/11/18 1645 10/12/18 0115 10/12/18 0515 10/12/18 1157 10/13/18 0310  WBC 13.2*  --  13.7* 15.7*  --  17.6*  --   --  18.5*  --   --  14.8*  NEUTROABS 10.0*  --  10.6* 11.7*  --   --   --   --   --   --   --   --   HGB 15.3   < > 14.9 15.4   < > 13.5   < > 13.3 13.6 12.9* 13.3 13.6  HCT 44.3   < > 43.3 46.5   < > 41.8   < > 39.0 41.5 38.0* 39.0 44.5  MCV 85.7  --  87.3 89.6  --  90.9  --   --  92.2  --   --  94.1  PLT 249  --  230 250  --  235  --   --  236  --   --  271   < > = values in this interval not displayed.   Basic Metabolic Panel: Recent Labs  Lab 10/09/18 2020 10/10/18 0200  10/11/18 0400  10/11/18 1116  10/11/18 1645 10/12/18 0115 10/12/18 0515 10/12/18 1157 10/12/18 1815 10/13/18 0310  NA 123* 125*   < > 133*   < >  --    < > 136 136 140 141  --  142  K 4.6 4.5   < > 4.3   < >  --    < > 4.7 4.2 4.4 4.3  --  4.3  CL 86* 88*  --  97*  --   --   --   --  101  --   --   --  107  CO2 21* 23  --  24  --   --   --   --  27  --   --   --  28  GLUCOSE 319* 398*  --  158*  --   --   --   --  230*  --   --   --  181*  BUN 34* 40*  --  38*  --   --   --   --  59*  --   --   --  72*  CREATININE 2.00* 1.93*  --  1.55*  --   --   --   --  1.61*  --   --   --  1.46*  CALCIUM 7.5* 7.4*  --  7.6*  --   --   --   --  7.7*  --   --   --  8.5*  MG 2.0 2.2  --   --   --  2.8*  --   --  2.8*  --   --  3.0*  --   PHOS 4.3 5.0*  --   --   --  4.3  --   --  3.3  --   --  3.8  --    < > = values in this interval not displayed.   GFR: Estimated Creatinine Clearance: 96.8 mL/min (A) (by C-G formula based on SCr of 1.46 mg/dL (H)). Liver Function Tests: Recent Labs  Lab 10/09/18 2020 10/10/18 0200 10/11/18 0400 10/12/18 0115 10/13/18 0310  AST  1,510* 1,485* 595* 269* 74*  ALT 564* 592* 526* 456* 289*  ALKPHOS 62 64 56 51 52  BILITOT 1.1 0.8 0.6 0.7 0.4  PROT 6.9 7.0 6.8 6.3* 6.7  ALBUMIN 2.4* 2.6* 2.4* 2.2* 2.3*   No results for input(s): LIPASE, AMYLASE in the last 168 hours. No results for input(s): AMMONIA in the last 168 hours. Coagulation Profile: Recent Labs  Lab 10/11/18 0400 10/12/18 0115 10/13/18 0310  INR 1.1 1.1 1.2   Cardiac Enzymes: Recent Labs  Lab 10/10/18 0200 10/10/18 0600 10/10/18 1545  CKTOTAL 1,191*  --  1,256*  TROPONINI  --  0.04* <0.03   BNP (last 3 results) No results for input(s): PROBNP in the last 8760 hours. HbA1C: Recent Labs    10/11/18 0400  HGBA1C 10.0*   CBG: Recent Labs  Lab 10/13/18 0058 10/13/18 0306 10/13/18 0409 10/13/18 0610 10/13/18 0716  GLUCAP 173* 170* 188* 131* 129*   Lipid Profile: Recent Labs    10/12/18 0115 10/13/18 0310  TRIG 361* 292*   Thyroid Function Tests: No results for input(s): TSH, T4TOTAL, FREET4, T3FREE, THYROIDAB in the last 72 hours. Anemia Panel: Recent Labs    10/13/18 0310  FERRITIN 1,105*   Urine analysis: No results found for: COLORURINE, APPEARANCEUR, LABSPEC, PHURINE, GLUCOSEU, HGBUR, BILIRUBINUR, KETONESUR, PROTEINUR, UROBILINOGEN, NITRITE, LEUKOCYTESUR Sepsis Labs: _0 (procalcitonin:4,lacticacidven:4)  ) Recent Results (from the past 240 hour(s))  SARS Coronavirus 2 (CEPHEID- Performed in [facility] hospital lab), Hosp Order     Status: Abnormal   Collection Time: 10/09/18  5:30 PM  Result Value Ref Range Status   SARS Coronavirus 2 POSITIVE (A) NEGATIVE Final    Comment: RESULT CALLED TO, READ BACK BY AND VERIFIED WITH: R HARDY RN 1847 10/09/18 A BROWNING (NOTE) If result is NEGATIVE SARS-CoV-2 target nucleic acids are NOT DETECTED. The SARS-CoV-2 RNA is generally detectable in upper and lower  respiratory specimens during the acute phase of infection. The lowest  concentration of SARS-CoV-2 viral  copies this assay can detect is 250  copies / mL. A negative result does not preclude SARS-CoV-2 infection  and should not be used as the sole basis for treatment or other  patient management decisions.  A negative result may occur with  improper specimen collection / handling, submission of specimen other  than nasopharyngeal swab, presence of viral mutation(s) within the  areas targeted by this assay, and inadequate number of viral copies  (<250 copies / mL). A negative result must be combined with clinical  observations, patient history, and epidemiological information. If result is POSITIVE SARS-CoV-2 target nucleic acids are DETECTED. The  SARS-CoV-2 RNA is generally detectable in upper and lower  respiratory specimens during the acute phase of infection.  Positive  results are indicative of active infection with SARS-CoV-2.  Clinical  correlation with patient history and other diagnostic information is  necessary to determine patient infection status.  Positive results do  not rule out bacterial infection or co-infection with other viruses. If result is PRESUMPTIVE POSTIVE SARS-CoV-2 nucleic acids MAY BE PRESENT.   A presumptive positive result was obtained on the submitted specimen  and confirmed on repeat testing.  While 2019 novel coronavirus  (SARS-CoV-2) nucleic acids may be present in the submitted sample  additional confirmatory testing may be necessary for epidemiological  and / or clinical management purposes  to differentiate between  SARS-CoV-2 and other Sarbecovirus currently known to infect humans.  If clinically indicated additional testing with an alternate test  methodology [phone]) is a dvised. The SARS-CoV-2 RNA is generally  detectable in upper and lower respiratory specimens during the acute  phase of infection. The expected result is Negative. Fact Sheet for Patients:  StrictlyIdeas.no Fact Sheet for Healthcare Providers:  BankingDealers.co.za This test is not yet approved or cleared by the [place] FDA and has been authorized for detection and/or diagnosis of SARS-CoV-2 by FDA under an Emergency Use Authorization (EUA).  This EUA will remain in effect (meaning this test can be used) for the duration of the COVID-19 declaration under Section 564(b)(1) of the Act, 21 U.S.C. section 360bbb-3(b)(1), unless the authorization is terminated or revoked sooner. Performed at [facility], [place] [address] [place], [place] [address]   Blood Culture (routine x 2)     Status: None (Preliminary result)   Collection Time: 10/09/18  6:09 PM  Result Value Ref Range Status   Specimen Description BLOOD RIGHT ANTECUBITAL  Final   Special Requests   Final    BOTTLES DRAWN AEROBIC AND ANAEROBIC Blood Culture adequate volume   Culture   Final    NO GROWTH 4 DAYS Performed at [facility], [place] [address], [place], [place] [address]    Report Status PENDING  Incomplete  Culture, respiratory (non-expectorated)     Status: None   Collection Time: 10/10/18  9:04 AM  Result Value Ref Range Status   Specimen Description   Final    TRACHEAL ASPIRATE Performed at [place] [address] [place], [place] [address]    Special Requests   Final    Normal Performed at [facility], [place] [address], [place], [place] [address]    Gram Stain NO WBC SEEN NO ORGANISMS SEEN   Final   Culture   Final    RARE Consistent with normal respiratory flora. Performed at [facility], [place] [address], [place], [place] [address]    Report Status 10/12/2018 FINAL  Final  MRSA PCR Screening     Status: None   Collection Time: 10/11/18 12:31 PM  Result Value Ref Range Status   MRSA by PCR NEGATIVE NEGATIVE Final    Comment:        The GeneXpert MRSA Assay (FDA approved for NASAL specimens only), is one component of a comprehensive MRSA colonization  surveillance program. It is not intended to diagnose MRSA infection nor to guide or monitor treatment for MRSA infections. Performed at [facility], [place] [address], [place], [place] [address]          Radiology Studies: No results found.      Scheduled Meds: . chlorhexidine gluconate (MEDLINE KIT)  15 mL Mouth Rinse BID  . enoxaparin (LOVENOX) injection  75 mg Subcutaneous Q12H  . feeding supplement (PRO-STAT SUGAR FREE 64)  60 mL Per Tube TID  . mouth rinse  15 mL Mouth Rinse 10 times per day  .  methylPREDNISolone (SOLU-MEDROL) injection  50 mg Intravenous Q8H  . nutrition supplement (JUVEN)  1 packet Per Tube BID BM  . pantoprazole (PROTONIX) IV  40 mg Intravenous QHS   Continuous Infusions: . sodium chloride    . sodium chloride 10 mL/hr at 10/13/18 0700  . ceFEPime (MAXIPIME) IV Stopped (10/13/18 0342)  . feeding supplement (VITAL HIGH PROTEIN) 45 mL/hr at 10/12/18 1800  . HYDROmorphone 2 mg/hr (10/13/18 0700)  . insulin 6.2 mL/hr at 10/13/18 0700  . midazolam 5 mg/hr (10/13/18 0747)  . norepinephrine (LEVOPHED) Adult infusion Stopped (10/10/18 1454)     LOS: 4 days    Time spent:    The assigned patient code is [id number] Unique Dashboard Link: [email or web address]     During this encounter: Patient Isolation: Airborne, contact, droplet HCP PPE: Face shield, head covering, N95, gown, gloves, and shoe covers    Edwin Dada, MD [facility] 10/13/2018, 9:17 AM     Please page through [place]:  [email or web address] Password [email or web address] If 7PM-7AM, please contact night-coverage

## 2018-10-13 NOTE — Progress Notes (Signed)
Pt rotated to Supine position at 2045 without incident.

## 2018-10-13 NOTE — Progress Notes (Signed)
RT NOTE:  RT assisted with turning patient into prone position. ETT secure.

## 2018-10-13 NOTE — Progress Notes (Signed)
Pt placed in prone position with respiratory managing airway. No issues. Will draw ABG in one hour.

## 2018-10-13 NOTE — Progress Notes (Signed)
RT NOTE:   RT assisted with turning patient to supine position. ETT secured.

## 2018-10-13 NOTE — Progress Notes (Signed)
RN spoke with wife and updated on plan of care. She wishes to speak with a physician. Dr. [place] Bells was notified and he then called to update the family.

## 2018-10-14 ENCOUNTER — Inpatient Hospital Stay (HOSPITAL_COMMUNITY): Payer: Medicaid Other

## 2018-10-14 DIAGNOSIS — J80 Acute respiratory distress syndrome: Secondary | ICD-10-CM | POA: Diagnosis not present

## 2018-10-14 DIAGNOSIS — J988 Other specified respiratory disorders: Secondary | ICD-10-CM | POA: Diagnosis not present

## 2018-10-14 DIAGNOSIS — U071 COVID-19: Secondary | ICD-10-CM | POA: Diagnosis not present

## 2018-10-14 LAB — COMPREHENSIVE METABOLIC PANEL
ALT: 206 U/L — ABNORMAL HIGH (ref 0–44)
AST: 49 U/L — ABNORMAL HIGH (ref 15–41)
Albumin: 2.5 g/dL — ABNORMAL LOW (ref 3.5–5.0)
Alkaline Phosphatase: 51 U/L (ref 38–126)
Anion gap: 8 (ref 5–15)
BUN: 78 mg/dL — ABNORMAL HIGH (ref 6–20)
CO2: 29 mmol/L (ref 22–32)
Calcium: 8.7 mg/dL — ABNORMAL LOW (ref 8.9–10.3)
Chloride: 109 mmol/L (ref 98–111)
Creatinine, Ser: 1.45 mg/dL — ABNORMAL HIGH (ref 0.61–1.24)
GFR calc Af Amer: >60 mL/min (ref >60)
GFR calc non Af Amer: 57 mL/min — ABNORMAL LOW (ref >60)
Glucose, Bld: 171 mg/dL — ABNORMAL HIGH (ref 70–99)
Potassium: 4.2 mmol/L (ref 3.5–5.1)
Sodium: 146 mmol/L — ABNORMAL HIGH (ref 135–145)
Total Bilirubin: 0.4 mg/dL (ref 0.3–1.2)
Total Protein: 7.1 g/dL (ref 6.5–8.1)

## 2018-10-14 LAB — POCT I-STAT 7, (LYTES, BLD GAS, ICA,H+H)
Acid-Base Excess: 5 mmol/L — ABNORMAL HIGH (ref 0.0–2.0)
Bicarbonate: 29.4 mmol/L — ABNORMAL HIGH (ref 20.0–28.0)
Calcium, Ion: 1.25 mmol/L (ref 1.15–1.40)
HCT: 42 % (ref 39.0–52.0)
Hemoglobin: 14.3 g/dL (ref 13.0–17.0)
O2 Saturation: 94 %
Patient temperature: 99.2
Potassium: 4.3 mmol/L (ref 3.5–5.1)
Sodium: 147 mmol/L — ABNORMAL HIGH (ref 135–145)
TCO2: 31 mmol/L (ref 22–32)
pCO2 arterial: 40.9 mmHg (ref 32.0–48.0)
pH, Arterial: 7.466 — ABNORMAL HIGH (ref 7.350–7.450)
pO2, Arterial: 69 mmHg — ABNORMAL LOW (ref 83.0–108.0)

## 2018-10-14 LAB — CBC
HCT: 47.4 % (ref 39.0–52.0)
Hemoglobin: 14.5 g/dL (ref 13.0–17.0)
MCH: 29.2 pg (ref 26.0–34.0)
MCHC: 30.6 g/dL (ref 30.0–36.0)
MCV: 95.6 fL (ref 80.0–100.0)
Platelets: 291 10*3/uL (ref 150–400)
RBC: 4.96 MIL/uL (ref 4.22–5.81)
RDW: 13.6 % (ref 11.5–15.5)
WBC: 12.8 10*3/uL — ABNORMAL HIGH (ref 4.0–10.5)
nRBC: 0.4 % — ABNORMAL HIGH (ref 0.0–0.2)

## 2018-10-14 LAB — PROTIME-INR
INR: 1.2 (ref 0.8–1.2)
Prothrombin Time: 15.3 seconds — ABNORMAL HIGH (ref 11.4–15.2)

## 2018-10-14 LAB — GLUCOSE, CAPILLARY
Glucose-Capillary: 135 mg/dL — ABNORMAL HIGH (ref 70–99)
Glucose-Capillary: 148 mg/dL — ABNORMAL HIGH (ref 70–99)
Glucose-Capillary: 160 mg/dL — ABNORMAL HIGH (ref 70–99)
Glucose-Capillary: 172 mg/dL — ABNORMAL HIGH (ref 70–99)
Glucose-Capillary: 193 mg/dL — ABNORMAL HIGH (ref 70–99)
Glucose-Capillary: 199 mg/dL — ABNORMAL HIGH (ref 70–99)
Glucose-Capillary: 287 mg/dL — ABNORMAL HIGH (ref 70–99)
Glucose-Capillary: 318 mg/dL — ABNORMAL HIGH (ref 70–99)
Glucose-Capillary: 354 mg/dL — ABNORMAL HIGH (ref 70–99)

## 2018-10-14 LAB — CULTURE, BLOOD (ROUTINE X 2)
Culture: NO GROWTH
Special Requests: ADEQUATE

## 2018-10-14 LAB — TRIGLYCERIDES: Triglycerides: 262 mg/dL — ABNORMAL HIGH (ref <150)

## 2018-10-14 MED ORDER — INSULIN ASPART 100 UNIT/ML ~~LOC~~ SOLN
9.0000 [IU] | SUBCUTANEOUS | Status: DC
  Administered 2018-10-14 – 2018-10-17 (×15): 9 [IU] via SUBCUTANEOUS

## 2018-10-14 MED ORDER — DEXTROSE 10 % IV SOLN: INTRAVENOUS | Status: DC | PRN

## 2018-10-14 MED ORDER — ACETAMINOPHEN 160 MG/5ML PO SOLN
650.0000 mg | Freq: Four times a day (QID) | ORAL | Status: DC | PRN
  Administered 2018-10-14 – 2018-10-21 (×5): 650 mg
  Filled 2018-10-14 (×5): qty 20.3

## 2018-10-14 MED ORDER — INSULIN DETEMIR 100 UNIT/ML ~~LOC~~ SOLN
26.0000 [IU] | Freq: Two times a day (BID) | SUBCUTANEOUS | Status: DC
  Administered 2018-10-14 (×2): 26 [IU] via SUBCUTANEOUS
  Filled 2018-10-14 (×3): qty 0.26

## 2018-10-14 MED ORDER — CHLORHEXIDINE GLUCONATE CLOTH 2 % EX PADS
6.0000 | MEDICATED_PAD | Freq: Every day | CUTANEOUS | Status: DC
  Administered 2018-10-15 – 2018-10-16 (×2): 6 via TOPICAL

## 2018-10-14 MED ORDER — HYDRALAZINE HCL 20 MG/ML IJ SOLN
5.0000 mg | Freq: Four times a day (QID) | INTRAMUSCULAR | Status: DC | PRN
  Administered 2018-10-14: 5 mg via INTRAVENOUS
  Filled 2018-10-14: qty 1

## 2018-10-14 MED ORDER — INSULIN ASPART 100 UNIT/ML ~~LOC~~ SOLN
0.0000 [IU] | SUBCUTANEOUS | Status: DC
  Administered 2018-10-14: 11 [IU] via SUBCUTANEOUS
  Administered 2018-10-14: 8 [IU] via SUBCUTANEOUS
  Administered 2018-10-14: 15 [IU] via SUBCUTANEOUS
  Administered 2018-10-14 – 2018-10-15 (×2): 3 [IU] via SUBCUTANEOUS
  Administered 2018-10-15: 08:00:00 8 [IU] via SUBCUTANEOUS
  Administered 2018-10-15 (×2): 3 [IU] via SUBCUTANEOUS
  Administered 2018-10-15: 12:00:00 5 [IU] via SUBCUTANEOUS
  Administered 2018-10-15: 8 [IU] via SUBCUTANEOUS
  Administered 2018-10-16 (×2): 3 [IU] via SUBCUTANEOUS
  Administered 2018-10-16: 8 [IU] via SUBCUTANEOUS
  Administered 2018-10-16: 3 [IU] via SUBCUTANEOUS
  Administered 2018-10-16: 2 [IU] via SUBCUTANEOUS
  Administered 2018-10-16: 5 [IU] via SUBCUTANEOUS
  Administered 2018-10-17: 3 [IU] via SUBCUTANEOUS
  Administered 2018-10-17: 5 [IU] via SUBCUTANEOUS
  Administered 2018-10-17 (×2): 3 [IU] via SUBCUTANEOUS
  Administered 2018-10-17: 2 [IU] via SUBCUTANEOUS

## 2018-10-14 NOTE — Progress Notes (Signed)
[facility] PCCM  Called wife, she did not answer, left detailed message.  Roselie Awkward, MD [facility] Pager: [phone] Cell: [phone] If no response, call [phone]

## 2018-10-14 NOTE — Progress Notes (Signed)
Spoke with wife and updated her on plan of care. All questions answered.

## 2018-10-14 NOTE — Progress Notes (Signed)
RT NOTE:  RT assisted with turning patient into PRONE position. ETT secured

## 2018-10-14 NOTE — Progress Notes (Signed)
Patient placed in Prone position per protocol without incident.

## 2018-10-14 NOTE — Progress Notes (Signed)
PROGRESS NOTE    Diagonal  XFP:[id number] DOB: 1970/10/06 DOA: 10/09/2018 PCP: Patient, No Pcp Per      Brief Narrative:  [name] is a 48 y.o. M with obesity who presented with malaise, lethargy and weakness, progressive over several days.    Reportedly, EMS found patient with SpO2 30s%, came up to 70% with CPAP en route.  In ER, CXR with bilateral opacities, was altered and intubated.        Assessment & Plan:  Coronavirus pneumonitis with acute hypoxic respiratory failure Septic shock from coronavirus In setting of ongoing 2020 COVID-19 pandemic.  Presented with lung failure, hypotension requiring pressors, and evidence of end organ damage.  Remdesivir and Actemra contraindicated in setting of transaminitis.    S/p convalescent plasma 6/3 S/p 5 days steroids  Oxygenation continues to improve  -Continue VTE PPx with Lovenox, ICU dosing -Continue trend d-dimer, ferritin and CRP       Possible community acquired pneumonia Tracheal aspirate normal respiratory flora.  Procal 2 and trending up.  MRSA nasal swab negative. -Finish cefepime, day 6 of 6  Acute renal failure Baseline Cr unknown.  Here 2.0 mg/dL on admission.    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  No alcohol history obtained.  CK mild.  INR normal.   HIV negative.  Hep seros all negative.  Transaminases normalizing, still slightly elevated  Hyponatremia Normalized  Elevated troponin Likely septic injury or demand mismatch.  Doubt plaque rupture.  Troponin elevation low and flat.  BNP normal.  Type 2 diabetes Unknown if preivously on diabetes medicines, but A1c 10%.  Glucose improved, off steroids. -Stop insulin gtt -Start subq Levemir -Start SSI correction insulin  Pressure injury sacrum stage II present on arrival         MDM and disposition: The below  labs and imaging reports were reviewed and summarized above.  Medication management as above  The patient was admitted with hypoxic respiratory failure from [place].  He has also liver, renal and cardiac end organ damage.  High risk for mortality.   There is a high level of medical risk associated with this encounter, an extensive number of diagnoses or treatment options were managed.      Ventilator best practice:  Diet: Tube feeds VAP protocol (if indicated): In place GI prophylaxis: Pantoprazole Glucose control: Insulin Mobility: Defer for now  DVT prophylaxis: Lovenox Code Status: FULL     Consultants:   PCCM  Procedures:   ETT 6/3  Art line 6/3  Antimicrobials:   Vancomycin 6/2 >> 6/3  Cefepime 6/2 >> 6/7      Culture data:   6/2 blood culture x1 -- NGTD  6/3 respiratory culture -- normal respiratory flora     Subjective: Intubated and sedated.  Objective: Vitals:   10/14/18 1200 10/14/18 1300 10/14/18 1400 10/14/18 1404  BP:    (!) 170/74  Pulse: 68 67 69   Resp: (!) 28 (!) 28 (!) 28   Temp: 100 F (37.8 C)     TempSrc: Axillary     SpO2: 94% 94% 95%   Weight:      Height:        Intake/Output Summary (Last 24 hours) at 10/14/2018 1437 Last data filed at 10/14/2018 1400 Gross per 24 hour  Intake 2301.01 ml  Output 2835 ml  Net -533.99 ml   [employer]  10/12/18 0437 10/13/18 0500 10/14/18 0500  Weight: (!) 158.8 kg (!) 157.5 kg (!) 152 kg    Examination: General appearance: Obese adult male, prone, intubated and sedated. HEENT:    Skin: Skin warm and dry, no jaundice, no suspicious rashes or lesions. Cardiac: Regular rate and rhythm, no murmurs, JVP not visible, no lower extremity edema. Respiratory: Prone, on ventilator, lung sounds diminished, no rales or rales. Abdomen: In prone position. MSK: Obese, otherwise normal muscle bulk and tone. Neuro: Sedated. Psych: Unable to assess.    Data Reviewed: I have personally  reviewed following labs and imaging studies:  CBC: Recent Labs  Lab 10/09/18 1801  10/09/18 2020 10/10/18 0200  10/11/18 0400  10/12/18 0115 10/12/18 0515 10/12/18 1157 10/13/18 0310 10/14/18 0400 10/14/18 0554  WBC 13.2*  --  13.7* 15.7*  --  17.6*  --  18.5*  --   --  14.8* 12.8*  --   NEUTROABS 10.0*  --  10.6* 11.7*  --   --   --   --   --   --   --   --   --   HGB 15.3   < > 14.9 15.4   < > 13.5   < > 13.6 12.9* 13.3 13.6 14.5 14.3  HCT 44.3   < > 43.3 46.5   < > 41.8   < > 41.5 38.0* 39.0 44.5 47.4 42.0  MCV 85.7  --  87.3 89.6  --  90.9  --  92.2  --   --  94.1 95.6  --   PLT 249  --  230 250  --  235  --  236  --   --  271 291  --    < > = values in this interval not displayed.   Basic Metabolic Panel: Recent Labs  Lab 10/09/18 2020 10/10/18 0200  10/11/18 0400  10/11/18 1116  10/12/18 0115 10/12/18 0515 10/12/18 1157 10/12/18 1815 10/13/18 0310 10/14/18 0400 10/14/18 0554  NA 123* 125*   < > 133*   < >  --    < > 136 140 141  --  142 146* 147*  K 4.6 4.5   < > 4.3   < >  --    < > 4.2 4.4 4.3  --  4.3 4.2 4.3  CL 86* 88*  --  97*  --   --   --  101  --   --   --  107 109  --   CO2 21* 23  --  24  --   --   --  27  --   --   --  28 29  --   GLUCOSE 319* 398*  --  158*  --   --   --  230*  --   --   --  181* 171*  --   BUN 34* 40*  --  38*  --   --   --  59*  --   --   --  72* 78*  --   CREATININE 2.00* 1.93*  --  1.55*  --   --   --  1.61*  --   --   --  1.46* 1.45*  --   CALCIUM 7.5* 7.4*  --  7.6*  --   --   --  7.7*  --   --   --  8.5* 8.7*  --   MG 2.0 2.2  --   --   --  2.8*  --  2.8*  --   --  3.0*  --   --   --   PHOS 4.3 5.0*  --   --   --  4.3  --  3.3  --   --  3.8  --   --   --    < > = values in this interval not displayed.   GFR: Estimated Creatinine Clearance: 95.4 mL/min (A) (by C-G formula based on SCr of 1.45 mg/dL (H)). Liver Function Tests: Recent Labs  Lab 10/10/18 0200 10/11/18 0400 10/12/18 0115 10/13/18 0310 10/14/18 0400  AST  1,485* 595* 269* 74* 49*  ALT 592* 526* 456* 289* 206*  ALKPHOS 64 56 51 52 51  BILITOT 0.8 0.6 0.7 0.4 0.4  PROT 7.0 6.8 6.3* 6.7 7.1  ALBUMIN 2.6* 2.4* 2.2* 2.3* 2.5*   No results for input(s): LIPASE, AMYLASE in the last 168 hours. No results for input(s): AMMONIA in the last 168 hours. Coagulation Profile: Recent Labs  Lab 10/11/18 0400 10/12/18 0115 10/13/18 0310 10/14/18 0400  INR 1.1 1.1 1.2 1.2   Cardiac Enzymes: Recent Labs  Lab 10/10/18 0200 10/10/18 0600 10/10/18 1545  CKTOTAL 1,191*  --  1,256*  TROPONINI  --  0.04* <0.03   BNP (last 3 results) No results for input(s): PROBNP in the last 8760 hours. HbA1C: No results for input(s): HGBA1C in the last 72 hours. CBG: Recent Labs  Lab 10/14/18 0225 10/14/18 0426 10/14/18 0628 10/14/18 0841 10/14/18 1105  GLUCAP 148* 172* 160* 135* 193*   Lipid Profile: Recent Labs    10/13/18 0310 10/14/18 0400  TRIG 292* 262*   Thyroid Function Tests: No results for input(s): TSH, T4TOTAL, FREET4, T3FREE, THYROIDAB in the last 72 hours. Anemia Panel: Recent Labs    10/13/18 0310  FERRITIN 1,105*   Urine analysis: No results found for: COLORURINE, APPEARANCEUR, LABSPEC, PHURINE, GLUCOSEU, HGBUR, BILIRUBINUR, KETONESUR, PROTEINUR, UROBILINOGEN, NITRITE, LEUKOCYTESUR Sepsis Labs: '@[email or web address]' (procalcitonin:4,lacticacidven:4)  ) Recent Results (from the past 240 hour(s))  SARS Coronavirus 2 (CEPHEID- Performed in [facility] hospital lab), Hosp Order     Status: Abnormal   Collection Time: 10/09/18  5:30 PM  Result Value Ref Range Status   SARS Coronavirus 2 POSITIVE (A) NEGATIVE Final    Comment: RESULT CALLED TO, READ BACK BY AND VERIFIED WITH: R HARDY RN 1847 10/09/18 A BROWNING (NOTE) If result is NEGATIVE SARS-CoV-2 target nucleic acids are NOT DETECTED. The SARS-CoV-2 RNA is generally detectable in upper and lower  respiratory specimens during the acute phase of infection. The lowest  concentration of  SARS-CoV-2 viral copies this assay can detect is 250  copies / mL. A negative result does not preclude SARS-CoV-2 infection  and should not be used as the sole basis for treatment or other  patient management decisions.  A negative result may occur with  improper specimen collection / handling, submission of specimen other  than nasopharyngeal swab, presence of viral mutation(s) within the  areas targeted by this assay, and inadequate number of viral copies  (<250 copies / mL). A negative result must be combined with clinical  observations, patient history, and epidemiological information. If result is POSITIVE SARS-CoV-2 target nucleic acids are DETECTED. The  SARS-CoV-2 RNA is generally detectable in upper and lower  respiratory specimens during the acute phase of infection.  Positive  results are indicative of active infection with SARS-CoV-2.  Clinical  correlation with patient history and other diagnostic information is  necessary to determine patient  infection status.  Positive results do  not rule out bacterial infection or co-infection with other viruses. If result is PRESUMPTIVE POSTIVE SARS-CoV-2 nucleic acids MAY BE PRESENT.   A presumptive positive result was obtained on the submitted specimen  and confirmed on repeat testing.  While 2019 novel coronavirus  (SARS-CoV-2) nucleic acids may be present in the submitted sample  additional confirmatory testing may be necessary for epidemiological  and / or clinical management purposes  to differentiate between  SARS-CoV-2 and other Sarbecovirus currently known to infect humans.  If clinically indicated additional testing with an alternate test  methodology [phone]) is a dvised. The SARS-CoV-2 RNA is generally  detectable in upper and lower respiratory specimens during the acute  phase of infection. The expected result is Negative. Fact Sheet for Patients:  StrictlyIdeas.no Fact Sheet for Healthcare  Providers: BankingDealers.co.za This test is not yet approved or cleared by the [place] FDA and has been authorized for detection and/or diagnosis of SARS-CoV-2 by FDA under an Emergency Use Authorization (EUA).  This EUA will remain in effect (meaning this test can be used) for the duration of the COVID-19 declaration under Section 564(b)(1) of the Act, 21 U.S.C. section 360bbb-3(b)(1), unless the authorization is terminated or revoked sooner. Performed at [facility], [place] [address] [place], [place] [address]   Blood Culture (routine x 2)     Status: None   Collection Time: 10/09/18  6:09 PM  Result Value Ref Range Status   Specimen Description BLOOD RIGHT ANTECUBITAL  Final   Special Requests   Final    BOTTLES DRAWN AEROBIC AND ANAEROBIC Blood Culture adequate volume   Culture   Final    NO GROWTH 5 DAYS Performed at [facility], [place] [address] [place], [place] [address]    Report Status 10/14/2018 FINAL  Final  Culture, respiratory (non-expectorated)     Status: None   Collection Time: 10/10/18  9:04 AM  Result Value Ref Range Status   Specimen Description   Final    TRACHEAL ASPIRATE Performed at [facility] [address] [place], [place] [address]    Special Requests   Final    Normal Performed at [facility], [place] [address], [place], [place] [address]    Gram Stain NO WBC SEEN NO ORGANISMS SEEN   Final   Culture   Final    RARE Consistent with normal respiratory flora. Performed at [facility], [place] [address], [place], [place] [address]    Report Status 10/12/2018 FINAL  Final  MRSA PCR Screening     Status: None   Collection Time: 10/11/18 12:31 PM  Result Value Ref Range Status   MRSA by PCR NEGATIVE NEGATIVE Final    Comment:        The GeneXpert MRSA Assay (FDA approved for NASAL specimens only), is one component of a comprehensive MRSA colonization surveillance  program. It is not intended to diagnose MRSA infection nor to guide or monitor treatment for MRSA infections. Performed at [facility], [place] [address] [place], [place] [address]          Radiology Studies: Dg Chest Port 1 View  Result Date: 10/14/2018 CLINICAL DATA:  Acute respiratory failure. EXAM: PORTABLE CHEST 1 VIEW COMPARISON:  Chest radiograph 10/10/2018 FINDINGS: ET tube mid trachea. Enteric tube courses inferior to the diaphragm. Central venous catheter tip projects over the superior vena cava. Stable cardiomegaly. Slight interval improvement in  overall diffuse bilateral heterogeneous opacities with more focal worsening retrocardiac consolidation. Small left pleural effusion. IMPRESSION: Worsening retrocardiac consolidation with small left pleural effusion. Otherwise improving diffuse bilateral heterogeneous opacities. Electronically Signed   By: Lovey Newcomer M.D.   On: 10/14/2018 06:04        Scheduled Meds: . chlorhexidine gluconate (MEDLINE KIT)  15 mL Mouth Rinse BID  . enoxaparin (LOVENOX) injection  75 mg Subcutaneous Q12H  . feeding supplement (PRO-STAT SUGAR FREE 64)  60 mL Per Tube TID  . insulin aspart  0-15 Units Subcutaneous Q4H  . insulin aspart  9 Units Subcutaneous Q4H  . insulin detemir  26 Units Subcutaneous Q12H  . mouth rinse  15 mL Mouth Rinse 10 times per day  . nutrition supplement (JUVEN)  1 packet Per Tube BID BM  . oxyCODONE  5 mg Oral Q8H  . pantoprazole (PROTONIX) IV  40 mg Intravenous QHS   Continuous Infusions: . sodium chloride    . dextrose    . feeding supplement (VITAL HIGH PROTEIN) 1,000 mL (10/14/18 1435)  . HYDROmorphone 2 mg/hr (10/14/18 1400)  . midazolam 5 mg/hr (10/14/18 1400)  . norepinephrine (LEVOPHED) Adult infusion Stopped (10/10/18 1454)     LOS: 5 days    Time spent:    The assigned patient code is [id number] Unique Dashboard Link: [email or web address]      During this encounter: Patient Isolation: Airborne, contact, droplet HCP PPE: Face shield, head covering, N95, gown, gloves, and shoe covers    Edwin Dada, MD [facility] 10/14/2018, 2:37 PM     Please page through [facility]:  [email or web address] Password [email or web address] If 7PM-7AM, please contact night-coverage

## 2018-10-14 NOTE — Progress Notes (Signed)
RT NOTE:  RT assisted with turning patient from prone to supine position. ETT secured throughout.

## 2018-10-14 NOTE — Progress Notes (Signed)
NAME:  Roy Andrews, MRN:  [id number], DOB:  [birth year], LOS: 5 ADMISSION DATE:  10/09/2018, CONSULTATION DATE: October 09, 2018 REFERRING MD: Dr. Maryfrances Bunnellanford, CHIEF COMPLAINT: Dyspnea  Brief History   48 year old male with a past medical history significant for obesity hypertension and diabetes admitted with ARDS due to COVID-19 pneumonia.  Past Medical History  Obesity Hypertension Diabetes mellitus type 2  Significant Hospital Events   6/2 Intubation 6/3- Proned 6/4- Prone and diuresis 6/5- Prone and diuresis  Consults:  10/09/18 PCCM  Procedures:  10/09/18 ETT > 6/2 L IJ CVL >   Significant Diagnostic Tests:  10/09/18 CXR with bilateral infiltrates  Micro Data:  BCX 6/2 - pending Trach asp 6/3 - normal flora  Antimicrobials/COVID treatment:  Vanc 6/2>6/4 Cefepime 6/2> Convalescent plasma 6/3  Interim history/subjective:   Oxygenation improved Placed back in prone position overnight No other acute events   Objective   Blood pressure (!) 142/60, pulse (!) 58, temperature 98.7 F (37.1 C), temperature source Oral, resp. rate (!) 35, height 5\' 11"  (1.803 m), weight (!) 152 kg, SpO2 95 %.    Vent Mode: PRVC FiO2 (%):  [40 %-45 %] 45 % Set Rate:  [35 bmp] 35 bmp Vt Set:  [450 mL] 450 mL PEEP:  [8 cmH20] 8 cmH20 Plateau Pressure:  [20 cmH20-27 cmH20] 20 cmH20   Intake/Output Summary (Last 24 hours) at 10/14/2018 0801 Last data filed at 10/14/2018 0500 Gross per 24 hour  Intake 1959.65 ml  Output 2785 ml  Net -825.35 ml   Filed Weights   10/12/18 0437 10/13/18 0500 10/14/18 0500  Weight: (!) 158.8 kg (!) 157.5 kg (!) 152 kg    Examination:  General:  In bed on vent, prone position HENT: NCAT ETT in place PULM: CTA B, vent supported breathing CV: difficult to examine as in prone position, good cap refill, warm well perfused extremities GI: BS+ per flanks MSK: normal bulk and tone Neuro: sedated on vent  October 14, 2018 chest x-ray images  independently reviewed showing support apparatus in place, improved bibasilar airspace disease the radiology is concerned about increased consolidation in the left base, retrocardiac area  Resolved Hospital Problem list     Assessment & Plan:  ARDS secondary to COVID-19 pneumonia: Improving oxygenation this week Once resumed supine position will maintain in that position Continue ARDS ventilator protocol, tidal volume target 6 to 8 cc/kg of ideal body weight Ventilator associated pneumonia prevention protocol Stop Solu-Medrol today Diurese as able, target CVP less than 4, given rise in BUN to creatinine ratio will discuss holding furosemide today with attending   Sedation, on mechanical ventilation, need for ventilator synchrony Maintain RASS score -3 Wean down Dilaudid and Versed infusions overnight, aim to wean off 6/8 Continue oxycodone as ordered  AKI: Improving Monitor BMET and UOP Replace electrolytes as needed   Best practice:  Diet: continue tube feeding Pain/Anxiety/Delirium protocol (if indicated): RASS goal -3 VAP protocol (if indicated): yes DVT prophylaxis: lovenox bid per COVID Protocol GI prophylaxis: Pantoprazole for stress ulcer prophylaxis Glucose control: per TRH Mobility: bed rest Code Status: full Family Communication: updated his wife  Disposition: remain in icu  Labs   CBC: Recent Labs  Lab 10/09/18 1801  10/09/18 2020 10/10/18 0200  10/11/18 0400  10/12/18 0115 10/12/18 0515 10/12/18 1157 10/13/18 0310 10/14/18 0400 10/14/18 0554  WBC 13.2*  --  13.7* 15.7*  --  17.6*  --  18.5*  --   --  14.8* 12.8*  --  NEUTROABS 10.0*  --  10.6* 11.7*  --   --   --   --   --   --   --   --   --   HGB 15.3   < > 14.9 15.4   < > 13.5   < > 13.6 12.9* 13.3 13.6 14.5 14.3  HCT 44.3   < > 43.3 46.5   < > 41.8   < > 41.5 38.0* 39.0 44.5 47.4 42.0  MCV 85.7  --  87.3 89.6  --  90.9  --  92.2  --   --  94.1 95.6  --   PLT 249  --  230 250  --  235  --  236   --   --  271 291  --    < > = values in this interval not displayed.    Basic Metabolic Panel: Recent Labs  Lab 10/09/18 2020 10/10/18 0200  10/11/18 0400  10/11/18 1116  10/12/18 0115 10/12/18 0515 10/12/18 1157 10/12/18 1815 10/13/18 0310 10/14/18 0400 10/14/18 0554  NA 123* 125*   < > 133*   < >  --    < > 136 140 141  --  142 146* 147*  K 4.6 4.5   < > 4.3   < >  --    < > 4.2 4.4 4.3  --  4.3 4.2 4.3  CL 86* 88*  --  97*  --   --   --  101  --   --   --  107 109  --   CO2 21* 23  --  24  --   --   --  27  --   --   --  28 29  --   GLUCOSE 319* 398*  --  158*  --   --   --  230*  --   --   --  181* 171*  --   BUN 34* 40*  --  38*  --   --   --  59*  --   --   --  72* 78*  --   CREATININE 2.00* 1.93*  --  1.55*  --   --   --  1.61*  --   --   --  1.46* 1.45*  --   CALCIUM 7.5* 7.4*  --  7.6*  --   --   --  7.7*  --   --   --  8.5* 8.7*  --   MG 2.0 2.2  --   --   --  2.8*  --  2.8*  --   --  3.0*  --   --   --   PHOS 4.3 5.0*  --   --   --  4.3  --  3.3  --   --  3.8  --   --   --    < > = values in this interval not displayed.   GFR: Estimated Creatinine Clearance: 95.4 mL/min (A) (by C-G formula based on SCr of 1.45 mg/dL (H)). Recent Labs  Lab 10/09/18 1801 10/09/18 2001  10/10/18 0600 10/10/18 1545 10/11/18 0400 10/12/18 0115 10/13/18 0310 10/14/18 0400  PROCALCITON  --   --   --   --  1.92 2.10 1.03  --   --   WBC 13.2*  --    < >  --   --  17.6* 18.5* 14.8* 12.8*  LATICACIDVEN 4.2* 2.5*  --  1.9  --   --   --   --   --    < > = values in this interval not displayed.    Liver Function Tests: Recent Labs  Lab 10/10/18 0200 10/11/18 0400 10/12/18 0115 10/13/18 0310 10/14/18 0400  AST 1,485* 595* 269* 74* 49*  ALT 592* 526* 456* 289* 206*  ALKPHOS 64 56 51 52 51  BILITOT 0.8 0.6 0.7 0.4 0.4  PROT 7.0 6.8 6.3* 6.7 7.1  ALBUMIN 2.6* 2.4* 2.2* 2.3* 2.5*   No results for input(s): LIPASE, AMYLASE in the last 168 hours. No results for input(s): AMMONIA  in the last 168 hours.  ABG    Component Value Date/Time   PHART 7.466 (H) 10/14/2018 0554   PCO2ART 40.9 10/14/2018 0554   PO2ART 69.0 (L) 10/14/2018 0554   HCO3 29.4 (H) 10/14/2018 0554   TCO2 31 10/14/2018 0554   ACIDBASEDEF 1.0 10/11/2018 1134   O2SAT 94.0 10/14/2018 0554     Coagulation Profile: Recent Labs  Lab 10/11/18 0400 10/12/18 0115 10/13/18 0310 10/14/18 0400  INR 1.1 1.1 1.2 1.2    Cardiac Enzymes: Recent Labs  Lab 10/10/18 0200 10/10/18 0600 10/10/18 1545  CKTOTAL 1,191*  --  1,256*  TROPONINI  --  0.04* <0.03    HbA1C: Hgb A1c MFr Bld  Date/Time Value Ref Range Status  10/11/2018 04:00 AM 10.0 (H) 4.8 - 5.6 % Final    Comment:    (NOTE) Pre diabetes:          5.7%-6.4% Diabetes:              >6.4% Glycemic control for   <7.0% adults with diabetes     CBG: Recent Labs  Lab 10/13/18 2316 10/14/18 0024 10/14/18 0225 10/14/18 0426 10/14/18 0628  GLUCAP 214* 199* 148* 172* 160*     Critical care time: 32 minutes    Heber CarolinaBrent , MD [facility] Pager: [phone] Cell: [phone] If no response, call [phone]

## 2018-10-15 DIAGNOSIS — J80 Acute respiratory distress syndrome: Secondary | ICD-10-CM | POA: Diagnosis not present

## 2018-10-15 DIAGNOSIS — U071 COVID-19: Secondary | ICD-10-CM | POA: Diagnosis not present

## 2018-10-15 DIAGNOSIS — J988 Other specified respiratory disorders: Secondary | ICD-10-CM | POA: Diagnosis not present

## 2018-10-15 LAB — COMPREHENSIVE METABOLIC PANEL
ALT: 147 U/L — ABNORMAL HIGH (ref 0–44)
AST: 30 U/L (ref 15–41)
Albumin: 2.5 g/dL — ABNORMAL LOW (ref 3.5–5.0)
Alkaline Phosphatase: 48 U/L (ref 38–126)
Anion gap: 8 (ref 5–15)
BUN: 84 mg/dL — ABNORMAL HIGH (ref 6–20)
CO2: 30 mmol/L (ref 22–32)
Calcium: 8.8 mg/dL — ABNORMAL LOW (ref 8.9–10.3)
Chloride: 109 mmol/L (ref 98–111)
Creatinine, Ser: 1.41 mg/dL — ABNORMAL HIGH (ref 0.61–1.24)
GFR calc Af Amer: >60 mL/min (ref >60)
GFR calc non Af Amer: 59 mL/min — ABNORMAL LOW (ref >60)
Glucose, Bld: 297 mg/dL — ABNORMAL HIGH (ref 70–99)
Potassium: 4.3 mmol/L (ref 3.5–5.1)
Sodium: 147 mmol/L — ABNORMAL HIGH (ref 135–145)
Total Bilirubin: 0.6 mg/dL (ref 0.3–1.2)
Total Protein: 6.8 g/dL (ref 6.5–8.1)

## 2018-10-15 LAB — CBC
HCT: 48.3 % (ref 39.0–52.0)
Hemoglobin: 14.6 g/dL (ref 13.0–17.0)
MCH: 29.6 pg (ref 26.0–34.0)
MCHC: 30.2 g/dL (ref 30.0–36.0)
MCV: 98 fL (ref 80.0–100.0)
Platelets: 275 10*3/uL (ref 150–400)
RBC: 4.93 MIL/uL (ref 4.22–5.81)
RDW: 13.7 % (ref 11.5–15.5)
WBC: 11.2 10*3/uL — ABNORMAL HIGH (ref 4.0–10.5)
nRBC: 0.3 % — ABNORMAL HIGH (ref 0.0–0.2)

## 2018-10-15 LAB — PROTIME-INR
INR: 1.2 (ref 0.8–1.2)
Prothrombin Time: 15.2 seconds (ref 11.4–15.2)

## 2018-10-15 LAB — GLUCOSE, CAPILLARY
Glucose-Capillary: 161 mg/dL — ABNORMAL HIGH (ref 70–99)
Glucose-Capillary: 173 mg/dL — ABNORMAL HIGH (ref 70–99)
Glucose-Capillary: 177 mg/dL — ABNORMAL HIGH (ref 70–99)
Glucose-Capillary: 222 mg/dL — ABNORMAL HIGH (ref 70–99)
Glucose-Capillary: 232 mg/dL — ABNORMAL HIGH (ref 70–99)
Glucose-Capillary: 268 mg/dL — ABNORMAL HIGH (ref 70–99)

## 2018-10-15 LAB — C-REACTIVE PROTEIN: CRP: 0.9 mg/dL (ref <1.0)

## 2018-10-15 LAB — D-DIMER, QUANTITATIVE: D-Dimer, Quant: 2.87 ug/mL-FEU — ABNORMAL HIGH (ref 0.00–0.50)

## 2018-10-15 LAB — FERRITIN: Ferritin: 789 ng/mL — ABNORMAL HIGH (ref 24–336)

## 2018-10-15 LAB — TRIGLYCERIDES: Triglycerides: 301 mg/dL — ABNORMAL HIGH (ref <150)

## 2018-10-15 MED ORDER — MIDAZOLAM HCL 2 MG/2ML IJ SOLN
1.0000 mg | INTRAMUSCULAR | Status: DC | PRN
  Administered 2018-10-16 (×2): 2 mg via INTRAVENOUS
  Filled 2018-10-15 (×2): qty 2

## 2018-10-15 MED ORDER — PANTOPRAZOLE SODIUM 40 MG PO PACK
40.0000 mg | PACK | Freq: Every day | ORAL | Status: DC
  Administered 2018-10-15 – 2018-10-21 (×6): 40 mg
  Filled 2018-10-15 (×8): qty 20

## 2018-10-15 MED ORDER — INSULIN DETEMIR 100 UNIT/ML ~~LOC~~ SOLN
30.0000 [IU] | Freq: Two times a day (BID) | SUBCUTANEOUS | Status: DC
  Administered 2018-10-15 – 2018-10-20 (×10): 30 [IU] via SUBCUTANEOUS
  Filled 2018-10-15 (×12): qty 0.3

## 2018-10-15 NOTE — Progress Notes (Signed)
Spoke with patient's wife. Updated on plan of care. All questions answered.

## 2018-10-15 NOTE — Progress Notes (Signed)
PROGRESS NOTE    [place]  OTR:[id number] DOB: 04-26-1971 DOA: 10/09/2018 PCP: Patient, No Pcp Per      Brief Narrative:  [name] is a 48 y.o. M with obesity who presented with malaise, lethargy and weakness, progressive over several days.    Reportedly, EMS found patient with SpO2 30s%, came up to 70% with CPAP en route.  In ER, CXR with bilateral opacities, was altered and intubated.        Assessment & Plan:  Coronavirus pneumonitis with acute hypoxic respiratory failure Septic shock from coronavirus In setting of ongoing 2020 COVID-19 pandemic.  Presented with lung failure, hypotension requiring pressors, and evidence of end organ damage.  Remdesivir and Actemra contraindicated in setting of transaminitis.    S/p convalescent plasma 6/3 S/p 5 days steroids S/p 6 days cefepime  Oxygenation improved.  Inflammatory markers imrpoving.  -Continue VTE PPx with Lovenox, ICU dosing -Continue trend d-dimer, ferritin and CRP       Possible community acquired pneumonia Tracheal aspirate normal respiratory flora.   MRSA nasal swab negative.  Completed 6 days cefepime.  Acute renal failure Baseline Cr unknown.  Here 2.0 mg/dL on admission.    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  No alcohol history obtained.  CK mild.  INR normal.   HIV negative.  Hep seros all negative.  Transaminases improving.  Synthetic liver function normal. -Trend LFTs  Hyponatremia Na trended up.  Elevated troponin Likely septic injury or demand mismatch.  Doubt plaque rupture.  Troponin elevation low and flat.  BNP normal.  Type 2 diabetes Unknown if preivously on diabetes medicines, but A1c 10%.  Glucose elevated. -Continue Levemir, increase dose -Continue SSI correction insulin  Pressure injury sacrum stage II present on arrival         MDM  and disposition: Low labs and imaging reports reviewed and summarized above.  Medication management as above.  The patient was admitted with hypoxic respiratory failure from [place].  He has also liver, renal and cardiac end organ damage.  High risk for mortality.   There is a high level of medical risk associated with this count, the patient is a severe illness which poses a severe threat to life and bodily function.  He has 5 new problems listed above, two of which require active work up and adjustment of treamtnet.      Ventilator best practice:  Diet: Tube feeds VAP protocol (if indicated): In place GI prophylaxis: Pantoprazole Glucose control: Insulin Mobility: Defer for now  DVT prophylaxis: Lovenox Code Status: FULL     Consultants:   PCCM  Procedures:   ETT 6/3  Art line 6/3  Antimicrobials:   Vancomycin 6/2 >> 6/3  Cefepime 6/2 >> 6/7      Culture data:   6/2 blood culture x1 -- NGTD  6/3 respiratory culture -- normal respiratory flora     Subjective: Intubated.  Fever to 101F overnight. No respiratory distress or asynchrony.  No diarrhea, clinical bleeding.  Objective: Vitals:   10/15/18 0600 10/15/18 0740 10/15/18 0800 10/15/18 0900  BP:      Pulse: 60  (!) 56 (!) 59  Resp: (!) 28  (!) 28 (!) 28  Temp:   98.2 F (36.8 C)   TempSrc:   Axillary   SpO2: 94% 91% 91% 94%  Weight:      Height:  Intake/Output Summary (Last 24 hours) at 10/15/2018 1034 Last data filed at 10/15/2018 0800 Gross per 24 hour  Intake 1669.71 ml  Output 3540 ml  Net -1870.29 ml   Filed Weights   10/13/18 0500 10/14/18 0500 10/15/18 0130  Weight: (!) 157.5 kg (!) 152 kg (!) 151 kg    Examination: General appearance: Adult male, lying in bed, no acute distress. HEENT:   Anicteric, conjunctival pink, lids and lashes normal.  No nasal deformity, discharge, or epistaxis.  Lips normal, ET tube in place. Skin: Skin warm and dry, no suspicious rashes or  lesions.. Cardiac: Regular rate and rhythm, no murmurs, JVP not visible, no lower extremity edema. Respiratory: Sedated on ventilator, lung sounds diminished, no rales or wheezes. Abdomen: Abdomen soft, no tenderness to palpation, no grimace to palpation, no rigidity, no distention or ascites. MSK: Normal muscle bulk and tone. Neuro: Sedated. Psych: Unable to assess.    Data Reviewed: I have personally reviewed following labs and imaging studies:  CBC: Recent Labs  Lab 10/09/18 1801  10/09/18 2020 10/10/18 0200  10/11/18 0400  10/12/18 0115  10/12/18 1157 10/13/18 0310 10/14/18 0400 10/14/18 0554 10/15/18 0205  WBC 13.2*  --  13.7* 15.7*  --  17.6*  --  18.5*  --   --  14.8* 12.8*  --  11.2*  NEUTROABS 10.0*  --  10.6* 11.7*  --   --   --   --   --   --   --   --   --   --   HGB 15.3   < > 14.9 15.4   < > 13.5   < > 13.6   < > 13.3 13.6 14.5 14.3 14.6  HCT 44.3   < > 43.3 46.5   < > 41.8   < > 41.5   < > 39.0 44.5 47.4 42.0 48.3  MCV 85.7  --  87.3 89.6  --  90.9  --  92.2  --   --  94.1 95.6  --  98.0  PLT 249  --  230 250  --  235  --  236  --   --  271 291  --  275   < > = values in this interval not displayed.   Basic Metabolic Panel: Recent Labs  Lab 10/09/18 2020 10/10/18 0200  10/11/18 0400  10/11/18 1116  10/12/18 0115  10/12/18 1157 10/12/18 1815 10/13/18 0310 10/14/18 0400 10/14/18 0554 10/15/18 0205  NA 123* 125*   < > 133*   < >  --    < > 136   < > 141  --  142 146* 147* 147*  K 4.6 4.5   < > 4.3   < >  --    < > 4.2   < > 4.3  --  4.3 4.2 4.3 4.3  CL 86* 88*  --  97*  --   --   --  101  --   --   --  107 109  --  109  CO2 21* 23  --  24  --   --   --  27  --   --   --  28 29  --  30  GLUCOSE 319* 398*  --  158*  --   --   --  230*  --   --   --  181* 171*  --  297*  BUN 34* 40*  --  38*  --   --   --  59*  --   --   --  72* 78*  --  84*  CREATININE 2.00* 1.93*  --  1.55*  --   --   --  1.61*  --   --   --  1.46* 1.45*  --  1.41*  CALCIUM 7.5* 7.4*   --  7.6*  --   --   --  7.7*  --   --   --  8.5* 8.7*  --  8.8*  MG 2.0 2.2  --   --   --  2.8*  --  2.8*  --   --  3.0*  --   --   --   --   PHOS 4.3 5.0*  --   --   --  4.3  --  3.3  --   --  3.8  --   --   --   --    < > = values in this interval not displayed.   GFR: Estimated Creatinine Clearance: 97.8 mL/min (A) (by C-G formula based on SCr of 1.41 mg/dL (H)). Liver Function Tests: Recent Labs  Lab 10/11/18 0400 10/12/18 0115 10/13/18 0310 10/14/18 0400 10/15/18 0205  AST 595* 269* 74* 49* 30  ALT 526* 456* 289* 206* 147*  ALKPHOS 56 51 52 51 48  BILITOT 0.6 0.7 0.4 0.4 0.6  PROT 6.8 6.3* 6.7 7.1 6.8  ALBUMIN 2.4* 2.2* 2.3* 2.5* 2.5*   No results for input(s): LIPASE, AMYLASE in the last 168 hours. No results for input(s): AMMONIA in the last 168 hours. Coagulation Profile: Recent Labs  Lab 10/11/18 0400 10/12/18 0115 10/13/18 0310 10/14/18 0400 10/15/18 0205  INR 1.1 1.1 1.2 1.2 1.2   Cardiac Enzymes: Recent Labs  Lab 10/10/18 0200 10/10/18 0600 10/10/18 1545  CKTOTAL 1,191*  --  1,256*  TROPONINI  --  0.04* <0.03   BNP (last 3 results) No results for input(s): PROBNP in the last 8760 hours. HbA1C: No results for input(s): HGBA1C in the last 72 hours. CBG: Recent Labs  Lab 10/14/18 1513 10/14/18 1933 10/14/18 2329 10/15/18 0345 10/15/18 0756  GLUCAP 354* 318* 287* 268* 222*   Lipid Profile: Recent Labs    10/14/18 0400 10/15/18 0205  TRIG 262* 301*   Thyroid Function Tests: No results for input(s): TSH, T4TOTAL, FREET4, T3FREE, THYROIDAB in the last 72 hours. Anemia Panel: Recent Labs    10/13/18 0310 10/15/18 0205  FERRITIN 1,105* 789*   Urine analysis: No results found for: COLORURINE, APPEARANCEUR, LABSPEC, PHURINE, GLUCOSEU, HGBUR, BILIRUBINUR, KETONESUR, PROTEINUR, UROBILINOGEN, NITRITE, LEUKOCYTESUR Sepsis Labs: _0 (procalcitonin:4,lacticacidven:4)  ) Recent Results (from the past 240 hour(s))  SARS Coronavirus 2  (CEPHEID- Performed in [place] hospital lab), Hosp Order     Status: Abnormal   Collection Time: 10/09/18  5:30 PM  Result Value Ref Range Status   SARS Coronavirus 2 POSITIVE (A) NEGATIVE Final    Comment: RESULT CALLED TO, READ BACK BY AND VERIFIED WITH: R HARDY RN 1847 10/09/18 A BROWNING (NOTE) If result is NEGATIVE SARS-CoV-2 target nucleic acids are NOT DETECTED. The SARS-CoV-2 RNA is generally detectable in upper and lower  respiratory specimens during the acute phase of infection. The lowest  concentration of SARS-CoV-2 viral copies this assay can detect is 250  copies / mL. A negative result does not preclude SARS-CoV-2 infection  and should not be used as the sole basis for treatment or other  patient management decisions.  A negative result may occur with  improper specimen  collection / handling, submission of specimen other  than nasopharyngeal swab, presence of viral mutation(s) within the  areas targeted by this assay, and inadequate number of viral copies  (<250 copies / mL). A negative result must be combined with clinical  observations, patient history, and epidemiological information. If result is POSITIVE SARS-CoV-2 target nucleic acids are DETECTED. The  SARS-CoV-2 RNA is generally detectable in upper and lower  respiratory specimens during the acute phase of infection.  Positive  results are indicative of active infection with SARS-CoV-2.  Clinical  correlation with patient history and other diagnostic information is  necessary to determine patient infection status.  Positive results do  not rule out bacterial infection or co-infection with other viruses. If result is PRESUMPTIVE POSTIVE SARS-CoV-2 nucleic acids MAY BE PRESENT.   A presumptive positive result was obtained on the submitted specimen  and confirmed on repeat testing.  While 2019 novel coronavirus  (SARS-CoV-2) nucleic acids may be present in the submitted sample  additional confirmatory testing  may be necessary for epidemiological  and / or clinical management purposes  to differentiate between  SARS-CoV-2 and other Sarbecovirus currently known to infect humans.  If clinically indicated additional testing with an alternate test  methodology [phone]) is a dvised. The SARS-CoV-2 RNA is generally  detectable in upper and lower respiratory specimens during the acute  phase of infection. The expected result is Negative. Fact Sheet for Patients:  StrictlyIdeas.no Fact Sheet for Healthcare Providers: BankingDealers.co.za This test is not yet approved or cleared by the [place] FDA and has been authorized for detection and/or diagnosis of SARS-CoV-2 by FDA under an Emergency Use Authorization (EUA).  This EUA will remain in effect (meaning this test can be used) for the duration of the COVID-19 declaration under Section 564(b)(1) of the Act, 21 U.S.C. section 360bbb-3(b)(1), unless the authorization is terminated or revoked sooner. Performed at [facility], [place] [address], [place], [place] [address]   Blood Culture (routine x 2)     Status: None   Collection Time: 10/09/18  6:09 PM  Result Value Ref Range Status   Specimen Description BLOOD RIGHT ANTECUBITAL  Final   Special Requests   Final    BOTTLES DRAWN AEROBIC AND ANAEROBIC Blood Culture adequate volume   Culture   Final    NO GROWTH 5 DAYS Performed at [facility], [place] [address] [place], [place] [address]    Report Status 10/14/2018 FINAL  Final  Culture, respiratory (non-expectorated)     Status: None   Collection Time: 10/10/18  9:04 AM  Result Value Ref Range Status   Specimen Description   Final    TRACHEAL ASPIRATE Performed at [place] [address] [place], [place] [address]    Special Requests   Final    Normal Performed at [facility], [place] [address], [place], [place] [address]    Gram Stain  NO WBC SEEN NO ORGANISMS SEEN   Final   Culture   Final    RARE Consistent with normal respiratory flora. Performed at [facility], [place] [address], [place], [place] [address]    Report Status 10/12/2018 FINAL  Final  MRSA PCR Screening     Status: None   Collection Time: 10/11/18 12:31 PM  Result Value Ref Range Status   MRSA by PCR NEGATIVE NEGATIVE Final    Comment:        The GeneXpert MRSA Assay (FDA approved for  NASAL specimens only), is one component of a comprehensive MRSA colonization surveillance program. It is not intended to diagnose MRSA infection nor to guide or monitor treatment for MRSA infections. Performed at [facility], [place] [address], [place], [place] [address]          Radiology Studies: Dg Chest Port 1 View  Result Date: 10/14/2018 CLINICAL DATA:  Acute respiratory failure. EXAM: PORTABLE CHEST 1 VIEW COMPARISON:  Chest radiograph 10/10/2018 FINDINGS: ET tube mid trachea. Enteric tube courses inferior to the diaphragm. Central venous catheter tip projects over the superior vena cava. Stable cardiomegaly. Slight interval improvement in overall diffuse bilateral heterogeneous opacities with more focal worsening retrocardiac consolidation. Small left pleural effusion. IMPRESSION: Worsening retrocardiac consolidation with small left pleural effusion. Otherwise improving diffuse bilateral heterogeneous opacities. Electronically Signed   By: Lovey Newcomer M.D.   On: 10/14/2018 06:04        Scheduled Meds: . chlorhexidine gluconate (MEDLINE KIT)  15 mL Mouth Rinse BID  . Chlorhexidine Gluconate Cloth  6 each Topical Q0600  . enoxaparin (LOVENOX) injection  75 mg Subcutaneous Q12H  . feeding supplement (PRO-STAT SUGAR FREE 64)  60 mL Per Tube TID  . insulin aspart  0-15 Units Subcutaneous Q4H  . insulin aspart  9 Units Subcutaneous Q4H  . insulin detemir  30 Units Subcutaneous Q12H  . mouth rinse  15 mL Mouth Rinse 10 times per day   . nutrition supplement (JUVEN)  1 packet Per Tube BID BM  . oxyCODONE  5 mg Oral Q8H  . pantoprazole sodium  40 mg Per Tube QHS   Continuous Infusions: . sodium chloride    . dextrose    . feeding supplement (VITAL HIGH PROTEIN) 45 mL/hr at 10/15/18 0800  . HYDROmorphone 2 mg/hr (10/15/18 0800)  . midazolam 5 mg/hr (10/15/18 0800)  . norepinephrine (LEVOPHED) Adult infusion Stopped (10/10/18 1454)     LOS: 6 days    Time spent: 35 minutes  The assigned patient code is [id number] Unique Dashboard Link: [email or web address]     During this encounter: Patient Isolation: Airborne, contact, droplet HCP PPE: Face shield, head covering, N95, gown, gloves, and shoe covers    Edwin Dada, MD [facility] 10/15/2018, 10:34 AM     Please page through AMION:  [email or web address] Password [email or web address] If 7PM-7AM, please contact night-coverage

## 2018-10-15 NOTE — Progress Notes (Signed)
NAME:  Roy Andrews, MRN:  [id number], DOB:  1970/12/14, LOS: 6 ADMISSION DATE:  10/09/2018, CONSULTATION DATE: October 09, 2018 REFERRING MD: Dr. Maryfrances Bunnellanford, CHIEF COMPLAINT: Dyspnea  Brief History   48 year old male with a past medical history significant for obesity hypertension and diabetes admitted with ARDS due to COVID-19 pneumonia.  Past Medical History  Obesity Hypertension Diabetes mellitus type 2  Significant Hospital Events   6/2 Intubation 6/3- Proned 6/4- Prone and diuresis 6/5- Prone and diuresis 6/6- prone 6/7- prone 6/8 starting to wean vent  Consults:  10/09/18 PCCM  Procedures:  10/09/18 ETT > 6/2 L IJ CVL >   Significant Diagnostic Tests:  10/09/18 CXR with bilateral infiltrates  Micro Data:  BCX 6/2 - pending Trach asp 6/3 - normal flora  Antimicrobials/COVID treatment:  Vanc 6/2>6/4 Cefepime 6/2> Convalescent plasma 6/3  Interim history/subjective:   Weaning vent, respiratory rate turned down Remains in supine position this morning Oxygenation okay Stopping sedation now  Objective   Blood pressure (!) 140/35, pulse 60, temperature 99.1 F (37.3 C), temperature source Axillary, resp. rate (!) 28, height 5\' 11"  (1.803 m), weight (!) 151 kg, SpO2 94 %.    Vent Mode: PRVC FiO2 (%):  [40 %-50 %] 50 % Set Rate:  [28 bmp] 28 bmp Vt Set:  [450 mL] 450 mL PEEP:  [8 cmH20] 8 cmH20 Plateau Pressure:  [20 cmH20-22 cmH20] 22 cmH20   Intake/Output Summary (Last 24 hours) at 10/15/2018 0805 Last data filed at 10/15/2018 0600 Gross per 24 hour  Intake 1599.03 ml  Output 3350 ml  Net -1750.97 ml   Filed Weights   10/13/18 0500 10/14/18 0500 10/15/18 0130  Weight: (!) 157.5 kg (!) 152 kg (!) 151 kg    Examination:  General:  In bed on vent HENT: NCAT ETT in place PULM: CTA B, vent supported breathing CV: RRR, no mgr GI: BS+, soft, nontender MSK: normal bulk and tone Neuro: sedated on vent  October 14, 2018 chest x-ray images  independently reviewed showing support apparatus in place, improved bibasilar airspace disease the radiology is concerned about increased consolidation in the left base, retrocardiac area  Resolved Hospital Problem list     Assessment & Plan:  ARDS secondary to COVID-19 pneumonia: Improving oxygenation this week Pressure support ventilation now Wean sedation, target 0 to -1 RA SS score Ventilator associated pneumonia prevention protocol Diurese as able Hopeful for extubation in next 24 hours  Sedation, on mechanical ventilation, need for ventilator synchrony Change RA SS target 0 to -1 Wean off Dilaudid infusion Stop versed infusion Use PRN Versed  AKI: Improving Monitor BMET and UOP Replace electrolytes as needed   Best practice:  Diet: continue tube feeding Pain/Anxiety/Delirium protocol (if indicated): RASS goal -3 VAP protocol (if indicated): yes DVT prophylaxis: lovenox bid per COVID Protocol GI prophylaxis: Pantoprazole for stress ulcer prophylaxis Glucose control: per TRH Mobility: bed rest Code Status: full Family Communication: updated his wife 6/7, will call again today Disposition: remain in icu  Labs   CBC: Recent Labs  Lab 10/09/18 1801  10/09/18 2020 10/10/18 0200  10/11/18 0400  10/12/18 0115  10/12/18 1157 10/13/18 0310 10/14/18 0400 10/14/18 0554 10/15/18 0205  WBC 13.2*  --  13.7* 15.7*  --  17.6*  --  18.5*  --   --  14.8* 12.8*  --  11.2*  NEUTROABS 10.0*  --  10.6* 11.7*  --   --   --   --   --   --   --   --   --   --  HGB 15.3   < > 14.9 15.4   < > 13.5   < > 13.6   < > 13.3 13.6 14.5 14.3 14.6  HCT 44.3   < > 43.3 46.5   < > 41.8   < > 41.5   < > 39.0 44.5 47.4 42.0 48.3  MCV 85.7  --  87.3 89.6  --  90.9  --  92.2  --   --  94.1 95.6  --  98.0  PLT 249  --  230 250  --  235  --  236  --   --  271 291  --  275   < > = values in this interval not displayed.    Basic Metabolic Panel: Recent Labs  Lab 10/09/18 2020 10/10/18 0200   10/11/18 0400  10/11/18 1116  10/12/18 0115  10/12/18 1157 10/12/18 1815 10/13/18 0310 10/14/18 0400 10/14/18 0554 10/15/18 0205  NA 123* 125*   < > 133*   < >  --    < > 136   < > 141  --  142 146* 147* 147*  K 4.6 4.5   < > 4.3   < >  --    < > 4.2   < > 4.3  --  4.3 4.2 4.3 4.3  CL 86* 88*  --  97*  --   --   --  101  --   --   --  107 109  --  109  CO2 21* 23  --  24  --   --   --  27  --   --   --  28 29  --  30  GLUCOSE 319* 398*  --  158*  --   --   --  230*  --   --   --  181* 171*  --  297*  BUN 34* 40*  --  38*  --   --   --  59*  --   --   --  72* 78*  --  84*  CREATININE 2.00* 1.93*  --  1.55*  --   --   --  1.61*  --   --   --  1.46* 1.45*  --  1.41*  CALCIUM 7.5* 7.4*  --  7.6*  --   --   --  7.7*  --   --   --  8.5* 8.7*  --  8.8*  MG 2.0 2.2  --   --   --  2.8*  --  2.8*  --   --  3.0*  --   --   --   --   PHOS 4.3 5.0*  --   --   --  4.3  --  3.3  --   --  3.8  --   --   --   --    < > = values in this interval not displayed.   GFR: Estimated Creatinine Clearance: 97.8 mL/min (A) (by C-G formula based on SCr of 1.41 mg/dL (H)). Recent Labs  Lab 10/09/18 1801 10/09/18 2001  10/10/18 0600 10/10/18 1545 10/11/18 0400 10/12/18 0115 10/13/18 0310 10/14/18 0400 10/15/18 0205  PROCALCITON  --   --   --   --  1.92 2.10 1.03  --   --   --   WBC 13.2*  --    < >  --   --  17.6* 18.5* 14.8* 12.8* 11.2*  LATICACIDVEN 4.2*  2.5*  --  1.9  --   --   --   --   --   --    < > = values in this interval not displayed.    Liver Function Tests: Recent Labs  Lab 10/11/18 0400 10/12/18 0115 10/13/18 0310 10/14/18 0400 10/15/18 0205  AST 595* 269* 74* 49* 30  ALT 526* 456* 289* 206* 147*  ALKPHOS 56 51 52 51 48  BILITOT 0.6 0.7 0.4 0.4 0.6  PROT 6.8 6.3* 6.7 7.1 6.8  ALBUMIN 2.4* 2.2* 2.3* 2.5* 2.5*   No results for input(s): LIPASE, AMYLASE in the last 168 hours. No results for input(s): AMMONIA in the last 168 hours.  ABG    Component Value Date/Time   PHART  7.466 (H) 10/14/2018 0554   PCO2ART 40.9 10/14/2018 0554   PO2ART 69.0 (L) 10/14/2018 0554   HCO3 29.4 (H) 10/14/2018 0554   TCO2 31 10/14/2018 0554   ACIDBASEDEF 1.0 10/11/2018 1134   O2SAT 94.0 10/14/2018 0554     Coagulation Profile: Recent Labs  Lab 10/11/18 0400 10/12/18 0115 10/13/18 0310 10/14/18 0400 10/15/18 0205  INR 1.1 1.1 1.2 1.2 1.2    Cardiac Enzymes: Recent Labs  Lab 10/10/18 0200 10/10/18 0600 10/10/18 1545  CKTOTAL 1,191*  --  1,256*  TROPONINI  --  0.04* <0.03    HbA1C: Hgb A1c MFr Bld  Date/Time Value Ref Range Status  10/11/2018 04:00 AM 10.0 (H) 4.8 - 5.6 % Final    Comment:    (NOTE) Pre diabetes:          5.7%-6.4% Diabetes:              >6.4% Glycemic control for   <7.0% adults with diabetes     CBG: Recent Labs  Lab 10/14/18 1105 10/14/18 1513 10/14/18 1933 10/14/18 2329 10/15/18 0345  GLUCAP 193* 354* 318* 287* 268*     Critical care time: 35 minutes    Roselie Awkward, MD [place] [facility] Pager: [phone] Cell: [phone] If no response, call [phone]

## 2018-10-15 NOTE — Progress Notes (Signed)
[facility] PCCM  I updated his wife [place] by phone  Roselie Awkward, MD [place] [facility]M Pager: [phone] Cell: [phone] If no response, call [phone]

## 2018-10-16 DIAGNOSIS — J80 Acute respiratory distress syndrome: Secondary | ICD-10-CM | POA: Diagnosis not present

## 2018-10-16 DIAGNOSIS — U071 COVID-19: Secondary | ICD-10-CM | POA: Diagnosis not present

## 2018-10-16 DIAGNOSIS — J988 Other specified respiratory disorders: Secondary | ICD-10-CM | POA: Diagnosis not present

## 2018-10-16 LAB — CBC
HCT: 53.8 % — ABNORMAL HIGH (ref 39.0–52.0)
Hemoglobin: 15.1 g/dL (ref 13.0–17.0)
MCH: 28.8 pg (ref 26.0–34.0)
MCHC: 28.1 g/dL — ABNORMAL LOW (ref 30.0–36.0)
MCV: 102.5 fL — ABNORMAL HIGH (ref 80.0–100.0)
Platelets: 262 10*3/uL (ref 150–400)
RBC: 5.25 MIL/uL (ref 4.22–5.81)
RDW: 13.6 % (ref 11.5–15.5)
WBC: 11.5 10*3/uL — ABNORMAL HIGH (ref 4.0–10.5)
nRBC: 0 % (ref 0.0–0.2)

## 2018-10-16 LAB — COMPREHENSIVE METABOLIC PANEL
ALT: 104 U/L — ABNORMAL HIGH (ref 0–44)
AST: 22 U/L (ref 15–41)
Albumin: 2.7 g/dL — ABNORMAL LOW (ref 3.5–5.0)
Alkaline Phosphatase: 54 U/L (ref 38–126)
Anion gap: 6 (ref 5–15)
BUN: 62 mg/dL — ABNORMAL HIGH (ref 6–20)
CO2: 37 mmol/L — ABNORMAL HIGH (ref 22–32)
Calcium: 9.2 mg/dL (ref 8.9–10.3)
Chloride: 108 mmol/L (ref 98–111)
Creatinine, Ser: 1.11 mg/dL (ref 0.61–1.24)
GFR calc Af Amer: >60 mL/min (ref >60)
GFR calc non Af Amer: >60 mL/min (ref >60)
Glucose, Bld: 203 mg/dL — ABNORMAL HIGH (ref 70–99)
Potassium: 4.7 mmol/L (ref 3.5–5.1)
Sodium: 151 mmol/L — ABNORMAL HIGH (ref 135–145)
Total Bilirubin: 0.4 mg/dL (ref 0.3–1.2)
Total Protein: 7.4 g/dL (ref 6.5–8.1)

## 2018-10-16 LAB — GLUCOSE, CAPILLARY
Glucose-Capillary: 171 mg/dL — ABNORMAL HIGH (ref 70–99)
Glucose-Capillary: 143 mg/dL — ABNORMAL HIGH (ref 70–99)
Glucose-Capillary: 270 mg/dL — ABNORMAL HIGH (ref 70–99)
Glucose-Capillary: 205 mg/dL — ABNORMAL HIGH (ref 70–99)
Glucose-Capillary: 199 mg/dL — ABNORMAL HIGH (ref 70–99)

## 2018-10-16 LAB — FERRITIN: Ferritin: 801 ng/mL — ABNORMAL HIGH (ref 24–336)

## 2018-10-16 LAB — D-DIMER, QUANTITATIVE: D-Dimer, Quant: 1.99 ug/mL-FEU — ABNORMAL HIGH (ref 0.00–0.50)

## 2018-10-16 LAB — C-REACTIVE PROTEIN: CRP: 6.9 mg/dL — ABNORMAL HIGH (ref <1.0)

## 2018-10-16 MED ORDER — STERILE WATER FOR INJECTION IJ SOLN
INTRAMUSCULAR | Status: AC
  Filled 2018-10-16: qty 10

## 2018-10-16 MED ORDER — FREE WATER
200.0000 mL | Freq: Three times a day (TID) | Status: DC
  Administered 2018-10-16: 200 mL

## 2018-10-16 MED ORDER — ROCURONIUM BROMIDE 10 MG/ML (PF) SYRINGE
PREFILLED_SYRINGE | INTRAVENOUS | Status: AC
  Filled 2018-10-16: qty 10

## 2018-10-16 MED ORDER — VECURONIUM BROMIDE 10 MG IV SOLR
INTRAVENOUS | Status: AC
  Filled 2018-10-16: qty 10

## 2018-10-16 MED ORDER — CHLORHEXIDINE GLUCONATE 0.12 % MT SOLN
15.0000 mL | Freq: Two times a day (BID) | OROMUCOSAL | Status: DC
  Administered 2018-10-16 – 2018-10-18 (×4): 15 mL via OROMUCOSAL
  Filled 2018-10-16 (×3): qty 15

## 2018-10-16 MED ORDER — HALOPERIDOL LACTATE 5 MG/ML IJ SOLN
2.0000 mg | Freq: Four times a day (QID) | INTRAMUSCULAR | Status: DC | PRN
  Administered 2018-10-17: 2 mg via INTRAVENOUS
  Filled 2018-10-16: qty 1

## 2018-10-16 MED ORDER — ETOMIDATE 2 MG/ML IV SOLN
INTRAVENOUS | Status: AC
  Filled 2018-10-16: qty 20

## 2018-10-16 MED ORDER — SUCCINYLCHOLINE CHLORIDE 200 MG/10ML IV SOSY
PREFILLED_SYRINGE | INTRAVENOUS | Status: AC
  Filled 2018-10-16: qty 10

## 2018-10-16 MED ORDER — ORAL CARE MOUTH RINSE
15.0000 mL | Freq: Two times a day (BID) | OROMUCOSAL | Status: DC
  Administered 2018-10-17: 15 mL via OROMUCOSAL

## 2018-10-16 MED ORDER — HALOPERIDOL LACTATE 5 MG/ML IJ SOLN
5.0000 mg | Freq: Once | INTRAMUSCULAR | Status: AC
  Administered 2018-10-16: 5 mg via INTRAVENOUS

## 2018-10-16 MED ORDER — METOPROLOL TARTRATE 5 MG/5ML IV SOLN
2.5000 mg | INTRAVENOUS | Status: DC | PRN
  Administered 2018-10-16 (×2): 5 mg via INTRAVENOUS
  Filled 2018-10-16 (×2): qty 5

## 2018-10-16 MED ORDER — VITAL HIGH PROTEIN PO LIQD
1000.0000 mL | ORAL | Status: DC
  Administered 2018-10-16: 1000 mL

## 2018-10-16 MED ORDER — PRO-STAT SUGAR FREE PO LIQD: 30.0000 mL | Freq: Three times a day (TID) | ORAL | Status: DC

## 2018-10-16 NOTE — Progress Notes (Signed)
Spoke with patient's wife and gave full update. Patient's wife has no additional questions.

## 2018-10-16 NOTE — Progress Notes (Signed)
Wasted 28mls of dilaudid with Evlyn Kanner, RN in [facility].

## 2018-10-16 NOTE — Progress Notes (Signed)
Nutrition Follow-up   RD working remotely.   DOCUMENTATION CODES:   Morbid obesity  INTERVENTION:    Tube Feeding:  Vital High Protein at 60 ml/hr Pro-Stat 30 mL TID Provides 1740 kcals, 172 g of protein and 1210 mL of free water  Continue Juven BID, each packet provides 80 calories, 8 grams of carbohydrate, 2.5  grams of protein (collagen), 7 grams of L-arginine and 7 grams of L-glutamine; supplement contains CaHMB, Vitamins C, E, B12 and Zinc to promote wound healing  Total: 1900 kcals, 177 g of protein  NUTRITION DIAGNOSIS:   Inadequate oral intake related to acute illness as evidenced by NPO status.  Being addressed via TF   GOAL:   Patient will meet greater than or equal to 90% of their needs  Progressing  MONITOR:   TF tolerance, Vent status, Labs, Weight trends, Skin  REASON FOR ASSESSMENT:   Ventilator    ASSESSMENT:   48 yo male admitted with ARDS secondary to COVID-19 pneumonits requiring intubation, AKI.  PMH include HTN, DM, obesity  6/2 Intubated 6/3-6/7 Proned daily  Vital High Protein at 45 ml/hr, Pro-Stat 60 mL TID, Juven BID, free water 200 mL q 8 hours via OG tube  Weight trending down since admission; current wt 148 kg; admission weight Net -5.7 L since admission per I/o flow sheet. Good UOP;  4 L in 24 hours  Labs: sodium 151 (H), Creatinine wdl, BUN 62 Meds: ss novolog, novolog q 4 hours, levemir BID  Diet Order:   Diet Order            Diet NPO time specified  Diet effective now              EDUCATION NEEDS:   Not appropriate for education at this time  Skin:  Skin Assessment: Skin Integrity Issues: Stage II: sacrum (present on admission), nose (new)  Last BM:  6/6  Height:   Ht Readings from Last 1 Encounters:  10/16/18 5\' 11"  (1.803 m)    Weight:   Wt Readings from Last 1 Encounters:  10/16/18 (!) 148 kg    Ideal Body Weight:  78 kg  BMI:  Body mass index is 45.5 kg/m.  Estimated Nutritional Needs:    Kcal:  3662-9476 kcals   Protein:  156-195 g  Fluid:  >/= 1.8 L   Kerman Passey MS, RD, LDN, CNSC [phone] Pager  [phone] Weekend/On-Call Pager

## 2018-10-16 NOTE — Progress Notes (Signed)
PROGRESS NOTE    Roy Andrews  GBT:[id number] DOB: 1970-12-31 DOA: 10/09/2018 PCP: Patient, No Pcp Per      Brief Narrative:  [name] is a 48 y.o. M with obesity who presented with malaise, lethargy and weakness, progressive over several days.    Reportedly, EMS found patient with SpO2 30s%, came up to 70% with CPAP en route.  In ER, CXR with bilateral opacities, LFTs >1000, Cr >2, was altered and intubated.        Assessment & Plan:  Coronavirus pneumonitis with acute hypoxic respiratory failure Septic shock from coronavirus In setting of ongoing 2020 COVID-19 pandemic.  Presented with lung failure, hypotension requiring pressors, and evidence of end organ damage.  Remdesivir and Actemra contraindicated in setting of transaminitis.    S/p convalescent plasma 6/3 S/p 5 days steroids S/p 6 days cefepime  Low-grade fever again today.  However oxygenation improving inflammatory markers trending down from peak.  -Continue VTE PPx with Lovenox, ICU dosing -Continue trend d-dimer, ferritin and CRP       Possible community acquired pneumonia Tracheal aspirate normal respiratory flora.   MRSA nasal swab negative.  Completed 6 days cefepime.  Acute renal failure Baseline Cr unknown.  Here 2.0 mg/dL on admission.    Creatinine down to 1.1 today. 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  No alcohol history obtained.  CK mild.  INR normal.   HIV negative.  Hep seros all negative.  Synthetic liver function normal.  Transaminases nearly resolved.  Hypernatremia -Start free water -Trend sodium  Elevated troponin Likely septic injury or demand mismatch.  Doubt plaque rupture.  Troponin elevation low and flat.  BNP normal.  Type 2 diabetes Unknown if preivously on diabetes medicines, but A1c 10%.  Glucose improved -Continue Levemir -Continue SSI correction insulin   Pressure injury sacrum stage II present on arrival         MDM and disposition: The below labs and imaging reports reviewed and summarized above.  Medication management as above.  The patient was admitted with hypoxic respiratory failure from [place].  He has also liver, renal and cardiac end organ damage.     This patient has a severe illness of which poses a severe threat to life and bodily function.  He has a respiratory failure, liver failure, acute renal failure, and diabetes as well as new hypernatremia, for which we will start free water and repeat a metabolic panel.        Ventilator best practice:  Diet: Tube feeds VAP protocol (if indicated): In place GI prophylaxis: Pantoprazole Glucose control: Insulin Mobility: Defer for now  DVT prophylaxis: Lovenox Code Status: FULL     Consultants:   PCCM  Procedures:   ETT 6/3  Art line 6/3  Antimicrobials:   Vancomycin 6/2 >> 6/3  Cefepime 6/2 >> 6/7      Culture data:   6/2 blood culture x1 -- NGTD  6/3 respiratory culture -- normal respiratory flora     Subjective: Low-grade fever overnight.  No respiratory distress or asynchrony.  Somewhat agitated today.  No diarrhea, melena.  Urine output normal.  Minor swelling on left face from proning.  Objective: Vitals:   10/16/18 0700 10/16/18 0800 10/16/18 0811 10/16/18 0900  BP: 1'14/72 94/64 94/64 ' 110/82  Pulse: 98 (!) 109 (!) 113 (!) 133  Resp: '20 20 20 ' (!) 23  Temp:  (!) 101.2 F (38.4 C)  TempSrc:  Axillary    SpO2: 94% 93% 94% 95%  Weight:      Height:        Intake/Output Summary (Last 24 hours) at 10/16/2018 1003 Last data filed at 10/16/2018 0900 Gross per 24 hour  Intake 1741.62 ml  Output 3945 ml  Net -2203.38 ml   Filed Weights   10/14/18 0500 10/15/18 0130 10/16/18 0500  Weight: (!) 152 kg (!) 151 kg (!) 148 kg    Examination: General appearance: Adult male, intubated and sedated.  Opens eyes to voice. HEENT:    Anicteric, conjunctival pink, left eyelid edematous, no erosions, no nasal deformity, discharge, or epistaxis.  Lips normal, small scrape under left nostril.  ET tube in place. Skin: Skin warm and dry, no suspicious rashes or lesions.  Sacral pressure injury is covered. Cardiac: Tachycardic, regular, no murmurs, JVP not visible due to body habitus, no lower extremity edema. Respiratory: Sedated on the ventilator, lung sounds diminished throughout, no wheezing appreciated. Abdomen: Abdomen soft, no tenderness to palpation or grimace, no rigidity, no hepatosplenomegaly noted.  Limited by habitus. MSK: Normal muscle bulk and tone. Neuro: Opens eyes to voice, shakes head no, moves all extremities. Psych: Unable to assess.    Data Reviewed: I have personally reviewed following labs and imaging studies:  CBC: Recent Labs  Lab 10/09/18 1801  10/09/18 2020 10/10/18 0200  10/11/18 0400  10/12/18 0115  10/12/18 1157 10/13/18 0310 10/14/18 0400 10/14/18 0554 10/15/18 0205  WBC 13.2*  --  13.7* 15.7*  --  17.6*  --  18.5*  --   --  14.8* 12.8*  --  11.2*  NEUTROABS 10.0*  --  10.6* 11.7*  --   --   --   --   --   --   --   --   --   --   HGB 15.3   < > 14.9 15.4   < > 13.5   < > 13.6   < > 13.3 13.6 14.5 14.3 14.6  HCT 44.3   < > 43.3 46.5   < > 41.8   < > 41.5   < > 39.0 44.5 47.4 42.0 48.3  MCV 85.7  --  87.3 89.6  --  90.9  --  92.2  --   --  94.1 95.6  --  98.0  PLT 249  --  230 250  --  235  --  236  --   --  271 291  --  275   < > = values in this interval not displayed.   Basic Metabolic Panel: Recent Labs  Lab 10/09/18 2020 10/10/18 0200  10/11/18 1116  10/12/18 0115  10/12/18 1815 10/13/18 0310 10/14/18 0400 10/14/18 0554 10/15/18 0205 10/16/18 0423  NA 123* 125*   < >  --    < > 136   < >  --  142 146* 147* 147* 151*  K 4.6 4.5   < >  --    < > 4.2   < >  --  4.3 4.2 4.3 4.3 4.7  CL 86* 88*   < >  --   --  101  --   --  107 109  --  109 108  CO2 21* 23   < >  --   --   27  --   --  28 29  --  30 37*  GLUCOSE 319* 398*   < >  --   --  230*  --   --  181* 171*  --  297* 203*  BUN 34* 40*   < >  --   --  59*  --   --  72* 78*  --  84* 62*  CREATININE 2.00* 1.93*   < >  --   --  1.61*  --   --  1.46* 1.45*  --  1.41* 1.11  CALCIUM 7.5* 7.4*   < >  --   --  7.7*  --   --  8.5* 8.7*  --  8.8* 9.2  MG 2.0 2.2  --  2.8*  --  2.8*  --  3.0*  --   --   --   --   --   PHOS 4.3 5.0*  --  4.3  --  3.3  --  3.8  --   --   --   --   --    < > = values in this interval not displayed.   GFR: Estimated Creatinine Clearance: 122.8 mL/min (by C-G formula based on SCr of 1.11 mg/dL). Liver Function Tests: Recent Labs  Lab 10/12/18 0115 10/13/18 0310 10/14/18 0400 10/15/18 0205 10/16/18 0423  AST 269* 74* 49* 30 22  ALT 456* 289* 206* 147* 104*  ALKPHOS 51 52 51 48 54  BILITOT 0.7 0.4 0.4 0.6 0.4  PROT 6.3* 6.7 7.1 6.8 7.4  ALBUMIN 2.2* 2.3* 2.5* 2.5* 2.7*   No results for input(s): LIPASE, AMYLASE in the last 168 hours. No results for input(s): AMMONIA in the last 168 hours. Coagulation Profile: Recent Labs  Lab 10/11/18 0400 10/12/18 0115 10/13/18 0310 10/14/18 0400 10/15/18 0205  INR 1.1 1.1 1.2 1.2 1.2   Cardiac Enzymes: Recent Labs  Lab 10/10/18 0200 10/10/18 0600 10/10/18 1545  CKTOTAL 1,191*  --  1,256*  TROPONINI  --  0.04* <0.03   BNP (last 3 results) No results for input(s): PROBNP in the last 8760 hours. HbA1C: No results for input(s): HGBA1C in the last 72 hours. CBG: Recent Labs  Lab 10/15/18 1547 10/15/18 1950 10/15/18 2330 10/16/18 0353 10/16/18 0741  GLUCAP 173* 177* 161* 171* 143*   Lipid Profile: Recent Labs    10/14/18 0400 10/15/18 0205  TRIG 262* 301*   Thyroid Function Tests: No results for input(s): TSH, T4TOTAL, FREET4, T3FREE, THYROIDAB in the last 72 hours. Anemia Panel: Recent Labs    10/15/18 0205 10/16/18 0350  FERRITIN 789* 801*   Urine analysis: No results found for: COLORURINE, APPEARANCEUR,  LABSPEC, PHURINE, GLUCOSEU, HGBUR, BILIRUBINUR, KETONESUR, PROTEINUR, UROBILINOGEN, NITRITE, LEUKOCYTESUR Sepsis Labs: '@LABRCNTIP' (procalcitonin:4,lacticacidven:4)  ) Recent Results (from the past 240 hour(s))  SARS Coronavirus 2 (CEPHEID- Performed in [place] hospital lab), Hosp Order     Status: Abnormal   Collection Time: 10/09/18  5:30 PM  Result Value Ref Range Status   SARS Coronavirus 2 POSITIVE (A) NEGATIVE Final    Comment: RESULT CALLED TO, READ BACK BY AND VERIFIED WITH: R HARDY RN 1847 10/09/18 A BROWNING (NOTE) If result is NEGATIVE SARS-CoV-2 target nucleic acids are NOT DETECTED. The SARS-CoV-2 RNA is generally detectable in upper and lower  respiratory specimens during the acute phase of infection. The lowest  concentration of SARS-CoV-2 viral copies this assay can detect is 250  copies / mL. A negative result does not preclude SARS-CoV-2 infection  and should not be used as the sole basis for treatment or other  patient management decisions.  A negative result may occur with  improper  specimen collection / handling, submission of specimen other  than nasopharyngeal swab, presence of viral mutation(s) within the  areas targeted by this assay, and inadequate number of viral copies  (<250 copies / mL). A negative result must be combined with clinical  observations, patient history, and epidemiological information. If result is POSITIVE SARS-CoV-2 target nucleic acids are DETECTED. The  SARS-CoV-2 RNA is generally detectable in upper and lower  respiratory specimens during the acute phase of infection.  Positive  results are indicative of active infection with SARS-CoV-2.  Clinical  correlation with patient history and other diagnostic information is  necessary to determine patient infection status.  Positive results do  not rule out bacterial infection or co-infection with other viruses. If result is PRESUMPTIVE POSTIVE SARS-CoV-2 nucleic acids MAY BE PRESENT.   A  presumptive positive result was obtained on the submitted specimen  and confirmed on repeat testing.  While 2019 novel coronavirus  (SARS-CoV-2) nucleic acids may be present in the submitted sample  additional confirmatory testing may be necessary for epidemiological  and / or clinical management purposes  to differentiate between  SARS-CoV-2 and other Sarbecovirus currently known to infect humans.  If clinically indicated additional testing with an alternate test  methodology [phone]) is a dvised. The SARS-CoV-2 RNA is generally  detectable in upper and lower respiratory specimens during the acute  phase of infection. The expected result is Negative. Fact Sheet for Patients:  StrictlyIdeas.no Fact Sheet for Healthcare Providers: BankingDealers.co.za This test is not yet approved or cleared by the [place] FDA and has been authorized for detection and/or diagnosis of SARS-CoV-2 by FDA under an Emergency Use Authorization (EUA).  This EUA will remain in effect (meaning this test can be used) for the duration of the COVID-19 declaration under Section 564(b)(1) of the Act, 21 U.S.C. section 360bbb-3(b)(1), unless the authorization is terminated or revoked sooner. Performed at [facility], [place] [address], [place], [place] [address]   Blood Culture (routine x 2)     Status: None   Collection Time: 10/09/18  6:09 PM  Result Value Ref Range Status   Specimen Description BLOOD RIGHT ANTECUBITAL  Final   Special Requests   Final    BOTTLES DRAWN AEROBIC AND ANAEROBIC Blood Culture adequate volume   Culture   Final    NO GROWTH 5 DAYS Performed at [facility], [place] [address], [place], [place] [address]    Report Status 10/14/2018 FINAL  Final  Culture, respiratory (non-expectorated)     Status: None   Collection Time: 10/10/18  9:04 AM  Result Value Ref Range Status   Specimen Description   Final    TRACHEAL ASPIRATE  Performed at [facility] [address] [place], [place] [address]    Special Requests   Final    Normal Performed at [facility], [place] [address] [place], [place] [address]    Gram Stain NO WBC SEEN NO ORGANISMS SEEN   Final   Culture   Final    RARE Consistent with normal respiratory flora. Performed at [facility], [place] [address] [place], [place] [address]    Report Status 10/12/2018 FINAL  Final  MRSA PCR Screening     Status: None   Collection Time: 10/11/18 12:31 PM  Result Value Ref Range Status   MRSA by PCR NEGATIVE NEGATIVE Final    Comment:        The GeneXpert MRSA Assay (FDA approved  for NASAL specimens only), is one component of a comprehensive MRSA colonization surveillance program. It is not intended to diagnose MRSA infection nor to guide or monitor treatment for MRSA infections. Performed at [facility], [place] [address] [place], [place] [address]          Radiology Studies: No results found.      Scheduled Meds: . chlorhexidine gluconate (MEDLINE KIT)  15 mL Mouth Rinse BID  . Chlorhexidine Gluconate Cloth  6 each Topical Q0600  . enoxaparin (LOVENOX) injection  75 mg Subcutaneous Q12H  . feeding supplement (PRO-STAT SUGAR FREE 64)  30 mL Per Tube TID  . free water  200 mL Per Tube Q8H  . insulin aspart  0-15 Units Subcutaneous Q4H  . insulin aspart  9 Units Subcutaneous Q4H  . insulin detemir  30 Units Subcutaneous Q12H  . mouth rinse  15 mL Mouth Rinse 10 times per day  . nutrition supplement (JUVEN)  1 packet Per Tube BID BM  . oxyCODONE  5 mg Oral Q8H  . pantoprazole sodium  40 mg Per Tube QHS   Continuous Infusions: . sodium chloride    . dextrose    . feeding supplement (VITAL HIGH PROTEIN)    . HYDROmorphone 2 mg/hr (10/16/18 0900)  . norepinephrine (LEVOPHED) Adult infusion Stopped (10/10/18 1454)     LOS: 7 days    Time spent: 35 minutes  The assigned  patient code is [id number] Unique Dashboard Link: [email or web address]     During this encounter: Patient Isolation: Airborne, contact, droplet HCP PPE: Face shield, head covering, N95, gown, gloves, and shoe covers    Edwin Dada, MD [facility] 10/16/2018, 10:03 AM     Please page through [place]:  [email or web address] Password [email or web address] If 7PM-7AM, please contact night-coverage

## 2018-10-16 NOTE — Progress Notes (Signed)
[facility] PCCM  Wife updated by phone. Questions answered  Roselie Awkward, MD [place] PCCM Pager: [phone] Cell: [phone] If no response, call [phone]

## 2018-10-16 NOTE — Progress Notes (Signed)
NAME:  Roy RubensJuan Antonio Paredes Andrews, MRN:  [id number], DOB:  29-Jun-1970, LOS: 7 ADMISSION DATE:  10/09/2018, CONSULTATION DATE: October 09, 2018 REFERRING MD: Dr. Maryfrances Bunnellanford, CHIEF COMPLAINT: Dyspnea  Brief History   48 year old male with a past medical history significant for obesity hypertension and diabetes admitted with ARDS due to COVID-19 pneumonia.  Past Medical History  Obesity Hypertension Diabetes mellitus type 2  Significant Hospital Events   6/2 Intubation 6/3- Proned 6/4- Prone and diuresis 6/5- Prone and diuresis 6/6- prone 6/7- prone 6/8 starting to wean vent  Consults:  10/09/18 PCCM  Procedures:  10/09/18 ETT > 6/2 L IJ CVL >   Significant Diagnostic Tests:  10/09/18 CXR with bilateral infiltrates  Micro Data:  BCX 6/2 - pending Trach asp 6/3 - normal flora  Antimicrobials/COVID treatment:  Vanc 6/2>6/4 Cefepime 6/2> Convalescent plasma 6/3  Interim history/subjective:    More awake and alert Weaning on ventilation, tolerating well Wants tube out  Objective   Blood pressure 114/72, pulse 98, temperature (!) 100.8 F (38.2 C), temperature source Axillary, resp. rate 20, height 5\' 11"  (1.803 m), weight (!) 148 kg, SpO2 94 %.    Vent Mode: PRVC FiO2 (%):  [40 %-45 %] 40 % Set Rate:  [20 bmp] 20 bmp Vt Set:  [450 mL] 450 mL PEEP:  [5 cmH20] 5 cmH20 Pressure Support:  [5 cmH20] 5 cmH20 Plateau Pressure:  [15 cmH20-20 cmH20] 15 cmH20   Intake/Output Summary (Last 24 hours) at 10/16/2018 0806 Last data filed at 10/16/2018 0700 Gross per 24 hour  Intake 1390.98 ml  Output 3630 ml  Net -2239.02 ml   Filed Weights   10/14/18 0500 10/15/18 0130 10/16/18 0500  Weight: (!) 152 kg (!) 151 kg (!) 148 kg    Examination:  General:  In bed on vent HENT: NCAT ETT in place PULM: CTA B, vent supported breathing CV: RRR, no mgr GI: BS+, soft, nontender MSK: normal bulk and tone Neuro: drowsy, arousable, follows commands  October 14, 2018 chest x-ray images  independently reviewed showing support apparatus in place, improved bibasilar airspace disease the radiology is concerned about increased consolidation in the left base, retrocardiac area  Resolved Hospital Problem list     Assessment & Plan:  ARDS secondary to COVID-19 pneumonia: Oxygenation continues to improve Likely obesity hypoventilation syndrome Likely diastolic heart failure and acute pulmonary edema Extubate Aspiration precautions BiPAP nightly, goal pressures < 20cm H20 to prevent aersolization Monitor carefully in ICU Lasix again today   Sedation, on mechanical ventilation, need for ventilator synchrony Stop sedation protocol  AKI: resolved Monitor BMET and UOP Replace electrolytes as needed    Best practice:  Diet: continue tube feeding Pain/Anxiety/Delirium protocol (if indicated): d/c VAP protocol (if indicated): d/c DVT prophylaxis: lovenox bid per COVID Protocol GI prophylaxis: Pantoprazole for stress ulcer prophylaxis Glucose control: per TRH Mobility: bed rest Code Status: full Family Communication: updated his wife 6/8, will call again today Disposition: remain in icu  Labs   CBC: Recent Labs  Lab 10/09/18 1801  10/09/18 2020 10/10/18 0200  10/11/18 0400  10/12/18 0115  10/12/18 1157 10/13/18 0310 10/14/18 0400 10/14/18 0554 10/15/18 0205  WBC 13.2*  --  13.7* 15.7*  --  17.6*  --  18.5*  --   --  14.8* 12.8*  --  11.2*  NEUTROABS 10.0*  --  10.6* 11.7*  --   --   --   --   --   --   --   --   --   --  HGB 15.3   < > 14.9 15.4   < > 13.5   < > 13.6   < > 13.3 13.6 14.5 14.3 14.6  HCT 44.3   < > 43.3 46.5   < > 41.8   < > 41.5   < > 39.0 44.5 47.4 42.0 48.3  MCV 85.7  --  87.3 89.6  --  90.9  --  92.2  --   --  94.1 95.6  --  98.0  PLT 249  --  230 250  --  235  --  236  --   --  271 291  --  275   < > = values in this interval not displayed.    Basic Metabolic Panel: Recent Labs  Lab 10/09/18 2020 10/10/18 0200  10/11/18 1116   10/12/18 0115  10/12/18 1815 10/13/18 0310 10/14/18 0400 10/14/18 0554 10/15/18 0205 10/16/18 0423  NA 123* 125*   < >  --    < > 136   < >  --  142 146* 147* 147* 151*  K 4.6 4.5   < >  --    < > 4.2   < >  --  4.3 4.2 4.3 4.3 4.7  CL 86* 88*   < >  --   --  101  --   --  107 109  --  109 108  CO2 21* 23   < >  --   --  27  --   --  28 29  --  30 37*  GLUCOSE 319* 398*   < >  --   --  230*  --   --  181* 171*  --  297* 203*  BUN 34* 40*   < >  --   --  59*  --   --  72* 78*  --  84* 62*  CREATININE 2.00* 1.93*   < >  --   --  1.61*  --   --  1.46* 1.45*  --  1.41* 1.11  CALCIUM 7.5* 7.4*   < >  --   --  7.7*  --   --  8.5* 8.7*  --  8.8* 9.2  MG 2.0 2.2  --  2.8*  --  2.8*  --  3.0*  --   --   --   --   --   PHOS 4.3 5.0*  --  4.3  --  3.3  --  3.8  --   --   --   --   --    < > = values in this interval not displayed.   GFR: Estimated Creatinine Clearance: 122.8 mL/min (by C-G formula based on SCr of 1.11 mg/dL). Recent Labs  Lab 10/09/18 1801 10/09/18 2001  10/10/18 0600 10/10/18 1545 10/11/18 0400 10/12/18 0115 10/13/18 0310 10/14/18 0400 10/15/18 0205  PROCALCITON  --   --   --   --  1.92 2.10 1.03  --   --   --   WBC 13.2*  --    < >  --   --  17.6* 18.5* 14.8* 12.8* 11.2*  LATICACIDVEN 4.2* 2.5*  --  1.9  --   --   --   --   --   --    < > = values in this interval not displayed.    Liver Function Tests: Recent Labs  Lab 10/12/18 0115 10/13/18 0310 10/14/18 0400 10/15/18 0205 10/16/18 0423  AST 269*  74* 49* 30 22  ALT 456* 289* 206* 147* 104*  ALKPHOS 51 52 51 48 54  BILITOT 0.7 0.4 0.4 0.6 0.4  PROT 6.3* 6.7 7.1 6.8 7.4  ALBUMIN 2.2* 2.3* 2.5* 2.5* 2.7*   No results for input(s): LIPASE, AMYLASE in the last 168 hours. No results for input(s): AMMONIA in the last 168 hours.  ABG    Component Value Date/Time   PHART 7.466 (H) 10/14/2018 0554   PCO2ART 40.9 10/14/2018 0554   PO2ART 69.0 (L) 10/14/2018 0554   HCO3 29.4 (H) 10/14/2018 0554   TCO2 31  10/14/2018 0554   ACIDBASEDEF 1.0 10/11/2018 1134   O2SAT 94.0 10/14/2018 0554     Coagulation Profile: Recent Labs  Lab 10/11/18 0400 10/12/18 0115 10/13/18 0310 10/14/18 0400 10/15/18 0205  INR 1.1 1.1 1.2 1.2 1.2    Cardiac Enzymes: Recent Labs  Lab 10/10/18 0200 10/10/18 0600 10/10/18 1545  CKTOTAL 1,191*  --  1,256*  TROPONINI  --  0.04* <0.03    HbA1C: Hgb A1c MFr Bld  Date/Time Value Ref Range Status  10/11/2018 04:00 AM 10.0 (H) 4.8 - 5.6 % Final    Comment:    (NOTE) Pre diabetes:          5.7%-6.4% Diabetes:              >6.4% Glycemic control for   <7.0% adults with diabetes     CBG: Recent Labs  Lab 10/15/18 1203 10/15/18 1547 10/15/18 1950 10/15/18 2330 10/16/18 0353  GLUCAP 232* 173* 177* 161* 171*     Critical care time: 32 minutes    Roselie Awkward, MD [place] PCCM Pager: [phone] Cell: [phone] If no response, call [phone]

## 2018-10-16 NOTE — Procedures (Signed)
Extubation Procedure Note  Patient Details:   Name: Roy Andrews DOB: 1970/12/19 MRN: [id number]   Airway Documentation:    Vent end date: 10/16/18 Vent end time: 1141   Evaluation  O2 sats: stable throughout Complications: No apparent complications Patient did tolerate procedure well. Bilateral Breath Sounds: Diminished, Clear   Yes   Patient extubated to 40l @ 80% HFNC per MD order.  Patient tolerated procedure well.  No complications noted; No stridor noted.  Patient able to vocalize post extubation.  Will continue to monitor.  Phillis Knack [facility] 10/16/2018, 11:54 AM

## 2018-10-16 NOTE — Progress Notes (Signed)
[facility] PCCM  Repeat exam Awake, alert Respirations even, non-labored Voice strong, wants to go home, wants water Sips of water Stay in ICU BIPAP qHS  Roselie Awkward, MD [place] PCCM Pager: [phone] Cell: [phone] If no response, call [phone]

## 2018-10-16 NOTE — Progress Notes (Signed)
[facility] PCCM  Extubated, tachycardic Was able to stand at bedside with assistance Denies dyspnea or pain  Will add prn metoprolol for BP/elevated HR Monitor carefully in ICU  Roselie Awkward, MD [place] PCCM Pager: [phone] Cell: [phone] If no response, call [phone]

## 2018-10-17 DIAGNOSIS — U071 COVID-19: Secondary | ICD-10-CM | POA: Diagnosis not present

## 2018-10-17 DIAGNOSIS — J988 Other specified respiratory disorders: Secondary | ICD-10-CM | POA: Diagnosis not present

## 2018-10-17 DIAGNOSIS — J9601 Acute respiratory failure with hypoxia: Secondary | ICD-10-CM | POA: Diagnosis not present

## 2018-10-17 DIAGNOSIS — J80 Acute respiratory distress syndrome: Secondary | ICD-10-CM | POA: Diagnosis not present

## 2018-10-17 LAB — D-DIMER, QUANTITATIVE: D-Dimer, Quant: 1.07 ug/mL-FEU — ABNORMAL HIGH (ref 0.00–0.50)

## 2018-10-17 LAB — GLUCOSE, CAPILLARY
Glucose-Capillary: 185 mg/dL — ABNORMAL HIGH (ref 70–99)
Glucose-Capillary: 121 mg/dL — ABNORMAL HIGH (ref 70–99)
Glucose-Capillary: 151 mg/dL — ABNORMAL HIGH (ref 70–99)
Glucose-Capillary: 161 mg/dL — ABNORMAL HIGH (ref 70–99)
Glucose-Capillary: 165 mg/dL — ABNORMAL HIGH (ref 70–99)
Glucose-Capillary: 229 mg/dL — ABNORMAL HIGH (ref 70–99)

## 2018-10-17 LAB — CBC
HCT: 52.7 % — ABNORMAL HIGH (ref 39.0–52.0)
Hemoglobin: 15.8 g/dL (ref 13.0–17.0)
MCH: 29.8 pg (ref 26.0–34.0)
MCHC: 30 g/dL (ref 30.0–36.0)
MCV: 99.4 fL (ref 80.0–100.0)
Platelets: 253 10*3/uL (ref 150–400)
RBC: 5.3 MIL/uL (ref 4.22–5.81)
RDW: 13.3 % (ref 11.5–15.5)
WBC: 13.8 10*3/uL — ABNORMAL HIGH (ref 4.0–10.5)
nRBC: 0 % (ref 0.0–0.2)

## 2018-10-17 LAB — COMPREHENSIVE METABOLIC PANEL
ALT: 91 U/L — ABNORMAL HIGH (ref 0–44)
AST: 61 U/L — ABNORMAL HIGH (ref 15–41)
Albumin: 2.8 g/dL — ABNORMAL LOW (ref 3.5–5.0)
Alkaline Phosphatase: 59 U/L (ref 38–126)
Anion gap: 11 (ref 5–15)
BUN: 47 mg/dL — ABNORMAL HIGH (ref 6–20)
CO2: 35 mmol/L — ABNORMAL HIGH (ref 22–32)
Calcium: 9.3 mg/dL (ref 8.9–10.3)
Chloride: 109 mmol/L (ref 98–111)
Creatinine, Ser: 1.28 mg/dL — ABNORMAL HIGH (ref 0.61–1.24)
GFR calc Af Amer: >60 mL/min (ref >60)
GFR calc non Af Amer: >60 mL/min (ref >60)
Glucose, Bld: 108 mg/dL — ABNORMAL HIGH (ref 70–99)
Potassium: 3.8 mmol/L (ref 3.5–5.1)
Sodium: 155 mmol/L — ABNORMAL HIGH (ref 135–145)
Total Bilirubin: 1.1 mg/dL (ref 0.3–1.2)
Total Protein: 7.7 g/dL (ref 6.5–8.1)

## 2018-10-17 LAB — C-REACTIVE PROTEIN: CRP: 12.7 mg/dL — ABNORMAL HIGH (ref <1.0)

## 2018-10-17 LAB — FERRITIN: Ferritin: 2196 ng/mL — ABNORMAL HIGH (ref 24–336)

## 2018-10-17 MED ORDER — SODIUM CHLORIDE 0.9 % IV SOLN
200.0000 mg | Freq: Once | INTRAVENOUS | Status: AC
  Administered 2018-10-17: 200 mg via INTRAVENOUS
  Filled 2018-10-17: qty 40

## 2018-10-17 MED ORDER — SODIUM CHLORIDE 0.9 % IV SOLN
100.0000 mg | INTRAVENOUS | Status: AC
  Administered 2018-10-18 – 2018-10-21 (×4): 100 mg via INTRAVENOUS
  Filled 2018-10-17 (×4): qty 20

## 2018-10-17 MED ORDER — LIVING WELL WITH DIABETES BOOK - IN SPANISH
Freq: Once | Status: AC
  Administered 2018-10-17: 16:00:00
  Filled 2018-10-17: qty 1

## 2018-10-17 MED ORDER — CHLORHEXIDINE GLUCONATE CLOTH 2 % EX PADS
6.0000 | MEDICATED_PAD | Freq: Every day | CUTANEOUS | Status: DC
  Administered 2018-10-18: 6 via TOPICAL

## 2018-10-17 MED ORDER — INSULIN ASPART 100 UNIT/ML ~~LOC~~ SOLN
0.0000 [IU] | Freq: Three times a day (TID) | SUBCUTANEOUS | Status: DC
  Administered 2018-10-18: 5 [IU] via SUBCUTANEOUS
  Administered 2018-10-18 (×2): 3 [IU] via SUBCUTANEOUS
  Administered 2018-10-18: 5 [IU] via SUBCUTANEOUS
  Administered 2018-10-19 (×2): 2 [IU] via SUBCUTANEOUS

## 2018-10-17 NOTE — Progress Notes (Signed)
Progress Note  O:   ALT: 91 CXR: worsening consolidation (6/7) SpO2: 100 on 10L HFNC  A/P:   Patient hospitalized with COVID-19 PNA 6/3. Patient has not yet received remdesivir but still requires oxygenation with worsening consolidation per CXR. TRH initiating remdesivir x 5d. Will order remdesivir 200 mg iv once followed by 100 mg iv daily x 4 days. Will f/u ALT.   Ulice Dash, PharmD Clinical Pharmacist

## 2018-10-17 NOTE — Evaluation (Signed)
Physical Therapy Evaluation Patient Details Name: Roy Andrews MRN: [id number] DOB: 1971/02/14 Today's Date: 10/17/2018   History of Present Illness  48 y.o. M with obesity who presented with malaise, lethargy and weakness, progressive over several days. +COVID Intubated and septic shock; extubated 10/16/18  Clinical Impression  Pt admitted with above diagnoses. Patient morbidly obese and with poor balance on initial transfer. Improved on second attempt, however fatigues quickly with elevated HR (103-125 bpm). Pt lives with spouse, however unable to determine amount of assistance family can provide on discharge as pt with minimal vocalizations at this time. (Despite use of Spanish interpreter). Pt currently with functional limitations due to the deficits listed below (see PT Problem List).  Pt will benefit from skilled PT to increase their independence and safety with mobility to allow discharge to the venue listed below. Will continue to assess his progress and discharge needs.     Follow Up Recommendations CIR    Equipment Recommendations  Other (comment)(TBA as progresses)    Recommendations for Other Services       Precautions / Restrictions Precautions Precautions: Fall Restrictions Weight Bearing Restrictions: No      Mobility  Bed Mobility               General bed mobility comments: up in recliner and did not want to return to bed  Transfers Overall transfer level: Needs assistance Equipment used: 2 person hand held assist Transfers: Sit to/from Stand Sit to Stand: Min assist;Mod assist         General transfer comment: x2; first mod assist, second min assist  Ambulation/Gait             General Gait Details: unable due to lines/monitors; stood x 2 minutes for RN to change dressing on sacrum  Stairs            Wheelchair Mobility    Modified Rankin (Stroke Patients Only)       Balance Overall balance assessment: Needs  assistance Sitting-balance support: No upper extremity supported;Feet supported Sitting balance-Leahy Scale: Fair     Standing balance support: Bilateral upper extremity supported Standing balance-Leahy Scale: Poor Standing balance comment: initial stand very unsteady with lean to right; second stand much better                              Pertinent Vitals/Pain Pain Assessment: Faces Faces Pain Scale: No hurt    Home Living Family/patient expects to be discharged to:: Private residence Living Arrangements: Spouse/significant other;Children Available Help at Discharge: Family Type of Home: House Home Access: Stairs to enter   Secretary/administratorntrance Stairs-Number of Steps: 3 Home Layout: One level Home Equipment: None      Prior Function Level of Independence: Independent         Comments: voice is weak/sore throat; nods yes that he works     Higher education careers adviserHand Dominance   Dominant Hand: Right    Extremity/Trunk Assessment   Upper Extremity Assessment Upper Extremity Assessment: Defer to OT evaluation    Lower Extremity Assessment Lower Extremity Assessment: Generalized weakness    Cervical / Trunk Assessment Cervical / Trunk Assessment: Other exceptions Cervical / Trunk Exceptions: morbid obesity  Communication   Communication: Expressive difficulties;Interpreter utilized(via [employer])  Cognition Arousal/Alertness: Lethargic Behavior During Therapy: WFL for tasks assessed/performed Overall Cognitive Status: No family/caregiver present to determine baseline cognitive functioning  General Comments: general slowed responses (?partially due to use of interpreter); frequent closing eyes (?due to swelling left eye--from proning per RN)      General Comments      Exercises     Assessment/Plan    PT Assessment Patient needs continued PT services  PT Problem List Decreased strength;Decreased activity  tolerance;Decreased balance;Decreased mobility;Decreased cognition;Decreased knowledge of use of DME;Decreased safety awareness;Cardiopulmonary status limiting activity;Obesity       PT Treatment Interventions DME instruction;Gait training;Functional mobility training;Therapeutic activities;Therapeutic exercise;Balance training;Cognitive remediation;Patient/family education    PT Goals (Current goals can be found in the Care Plan section)  Acute Rehab PT Goals Patient Stated Goal: agrees to goal to increase strength, mobility PT Goal Formulation: Patient unable to participate in goal setting Time For Goal Achievement: 10/31/18 Potential to Achieve Goals: Good    Frequency Min 3X/week   Barriers to discharge        Co-evaluation PT/OT/SLP Co-Evaluation/Treatment: Yes Reason for Co-Treatment: For patient/therapist safety(pt is a large man and weak) PT goals addressed during session: Mobility/safety with mobility;Balance         AM-PAC PT "6 Clicks" Mobility  Outcome Measure Help needed turning from your back to your side while in a flat bed without using bedrails?: A Lot Help needed moving from lying on your back to sitting on the side of a flat bed without using bedrails?: A Lot Help needed moving to and from a bed to a chair (including a wheelchair)?: A Lot Help needed standing up from a chair using your arms (e.g., wheelchair or bedside chair)?: A Lot Help needed to walk in hospital room?: Total Help needed climbing 3-5 steps with a railing? : Total 6 Click Score: 10    End of Session Equipment Utilized During Treatment: Oxygen Activity Tolerance: Patient limited by fatigue Patient left: in chair;with nursing/sitter in room Nurse Communication: Mobility status PT Visit Diagnosis: Unsteadiness on feet (R26.81);Muscle weakness (generalized) (M62.81)    Time: 1610-9604 PT Time Calculation (min) (ACUTE ONLY): 21 min   Charges:   PT Evaluation $PT Eval Moderate  Complexity: 1 Mod            [employer], PT 10/17/2018, 4:23 PM

## 2018-10-17 NOTE — Progress Notes (Signed)
Attempted to call patient's wife with phone number listed under contacts and phone went to voicemail. Will try again later this afternoon.

## 2018-10-17 NOTE — Progress Notes (Signed)
Rehab Admissions Coordinator Note:  Patient was screened by Michel Santee for appropriateness for an Inpatient Acute Rehab Consult.  Note that pt's most recent (+) test was on 10/09/2018.  Will continue to follow at a distance until patient meets current CDC guidelines for discharge to a facility before determining if pt will need CIR level therapies.   Shann Medal, PT, DPT Admissions Coordinator [phone] 10/17/18  5:11 PM

## 2018-10-17 NOTE — Progress Notes (Signed)
PROGRESS NOTE  [place]  GYI:[id number] DOB: 1970/06/22 DOA: 10/09/2018 PCP: Patient, No Pcp Per   Brief Narrative: Spencer is a 48 y.o. male with a history of obesity and OSA who presented with progressive weakness. EMS reported SpO2 in the 30%'s on their arrival, improved with CPAP en route. CXR showed bilateral infiltrates and covid-19 testing was positive. The patient was intubated, given cefepime, steroids, and convalescent plasma. LFTs and creatinine were elevated on admission, so remdesivir was not given initially. He was extubated 6/9 and remdesivir is now started with improvement in LFTs and elevation in inflammatory markers.   Assessment & Plan: Active Problems:   COVID-19   Acute respiratory failure with hypoxemia (HCC)   Acute respiratory distress syndrome (ARDS) due to COVID-19 virus   Pressure injury of skin  Septic shock and acute hypoxic and hypercarbic respiratory failure due to covid-19 pneumonia: - Continue airborne, contact precautions. PPE including surgical gown, gloves, face shield, cap, shoe covers, and N-95 used during this encounter in a negative pressure room.  - Check daily labs: CBC w/diff, CMP, d-dimer, fibrinogen, ferritin, LDH, CRP - Maintain euvolemia/net negative.  - Avoid NSAIDs - Recommend proning and aggressive use of incentive spirometry. - Start remdesivir, planning 5 day course. CRP rising from normal to >12 in 48 hours. If hypoxia worsens, low threshold for repeat CXR. - Tracheal aspirate grew OPF, MRSA PCR negative, stop abx  LFT elevation: Likely from hypoxic injury, improving. - Hepatitis serology, HIV, and INR all negative/normal.   Troponin elevation: Mild, flat trend  AKI: Creatinine improved. CrCl now >80ml/min. - Avoid nephrotoxins  Hypernatremia:  - Allow to drink water ad lib, monitor.  Obesity: BMI 44.  - Optimize nutritional status  T2DM: HbA1c 10%.  - Continue insulin  Stage II sacral  pressure injury POA: This is documented previously. Not examined personally. - Offload as able  DVT prophylaxis: Lovenox Code Status: Full Family Communication: PCCM updated family by phone today. Disposition Plan: Remain in ICU. Needs PT/OT  Consultants:   PCCM  Procedures:   ETT 6/3 >  Arterial line 6/3 >  Antimicrobials:  Vancomycin 6/2 >> 6/3  Cefepime 6/2 >> 6/7  Subjective: Lethargic this morning, but did well after extubation yesterday.  Objective: Vitals:   10/17/18 0500 10/17/18 0600 10/17/18 0601 10/17/18 0756  BP: (!) 127/94 136/87  136/82  Pulse: (!) 113 (!) 116 (!) 114 (!) 111  Resp: (!) 26 (!) 22 (!) 22 (!) 22  Temp:      TempSrc:      SpO2: 99% 98% 98% 93%  Weight: (!) 144.6 kg     Height:        Intake/Output Summary (Last 24 hours) at 10/17/2018 0944 Last data filed at 10/17/2018 0300 Gross per 24 hour  Intake 45.62 ml  Output 1620 ml  Net -1574.38 ml   Filed Weights   10/15/18 0130 10/16/18 0500 10/17/18 0500  Weight: (!) 151 kg (!) 148 kg (!) 144.6 kg    Gen: 48 y.o. male in no distress, responsive. Pulm: Non-labored breathing. Diminished but clear to auscultation bilaterally.  CV: Regular rate and rhythm. No murmur, rub, or gallop. No JVD, no pedal edema. GI: Abdomen soft, non-tender, non-distended, with normoactive bowel sounds. No organomegaly or masses felt. Ext: Warm, no deformities Skin: No rashes, lesions or ulcers on visualized skin Neuro: Alert and oriented. No focal neurological deficits. Psych: Judgement and insight appear normal. Mood & affect appropriate.   Data  Reviewed: I have personally reviewed following labs and imaging studies  CBC: Recent Labs  Lab 10/13/18 0310 10/14/18 0400 10/14/18 0554 10/15/18 0205 10/16/18 0423 10/17/18 0205  WBC 14.8* 12.8*  --  11.2* 11.5* 13.8*  HGB 13.6 14.5 14.3 14.6 15.1 15.8  HCT 44.5 47.4 42.0 48.3 53.8* 52.7*  MCV 94.1 95.6  --  98.0 102.5* 99.4  PLT 271 291  --  275 262  253   Basic Metabolic Panel: Recent Labs  Lab 10/11/18 1116  10/12/18 0115  10/12/18 1815 10/13/18 0310 10/14/18 0400 10/14/18 0554 10/15/18 0205 10/16/18 0423 10/17/18 0205  NA  --    < > 136   < >  --  142 146* 147* 147* 151* 155*  K  --    < > 4.2   < >  --  4.3 4.2 4.3 4.3 4.7 3.8  CL  --   --  101  --   --  107 109  --  109 108 109  CO2  --   --  27  --   --  28 29  --  30 37* 35*  GLUCOSE  --   --  230*  --   --  181* 171*  --  297* 203* 108*  BUN  --   --  59*  --   --  72* 78*  --  84* 62* 47*  CREATININE  --   --  1.61*  --   --  1.46* 1.45*  --  1.41* 1.11 1.28*  CALCIUM  --   --  7.7*  --   --  8.5* 8.7*  --  8.8* 9.2 9.3  MG 2.8*  --  2.8*  --  3.0*  --   --   --   --   --   --   PHOS 4.3  --  3.3  --  3.8  --   --   --   --   --   --    < > = values in this interval not displayed.   GFR: Estimated Creatinine Clearance: 105.1 mL/min (A) (by C-G formula based on SCr of 1.28 mg/dL (H)). Liver Function Tests: Recent Labs  Lab 10/13/18 0310 10/14/18 0400 10/15/18 0205 10/16/18 0423 10/17/18 0205  AST 74* 49* 30 22 61*  ALT 289* 206* 147* 104* 91*  ALKPHOS 52 51 48 54 59  BILITOT 0.4 0.4 0.6 0.4 1.1  PROT 6.7 7.1 6.8 7.4 7.7  ALBUMIN 2.3* 2.5* 2.5* 2.7* 2.8*   No results for input(s): LIPASE, AMYLASE in the last 168 hours. No results for input(s): AMMONIA in the last 168 hours. Coagulation Profile: Recent Labs  Lab 10/11/18 0400 10/12/18 0115 10/13/18 0310 10/14/18 0400 10/15/18 0205  INR 1.1 1.1 1.2 1.2 1.2   Cardiac Enzymes: Recent Labs  Lab 10/10/18 1545  CKTOTAL 1,256*  TROPONINI <0.03   BNP (last 3 results) No results for input(s): PROBNP in the last 8760 hours. HbA1C: No results for input(s): HGBA1C in the last 72 hours. CBG: Recent Labs  Lab 10/16/18 1157 10/16/18 1623 10/16/18 2011 10/17/18 0323 10/17/18 0746  GLUCAP 270* 205* 199* 121* 151*   Lipid Profile: Recent Labs    10/15/18 0205  TRIG 301*   Thyroid Function  Tests: No results for input(s): TSH, T4TOTAL, FREET4, T3FREE, THYROIDAB in the last 72 hours. Anemia Panel: Recent Labs    10/16/18 0350 10/17/18 0205  FERRITIN 801* 2,196*   Urine analysis: No  results found for: COLORURINE, APPEARANCEUR, LABSPEC, PHURINE, GLUCOSEU, HGBUR, BILIRUBINUR, KETONESUR, PROTEINUR, UROBILINOGEN, NITRITE, LEUKOCYTESUR Recent Results (from the past 240 hour(s))  SARS Coronavirus 2 (CEPHEID- Performed in [facility] hospital lab), Hosp Order     Status: Abnormal   Collection Time: 10/09/18  5:30 PM  Result Value Ref Range Status   SARS Coronavirus 2 POSITIVE (A) NEGATIVE Final    Comment: RESULT CALLED TO, READ BACK BY AND VERIFIED WITH: R HARDY RN 1847 10/09/18 A BROWNING (NOTE) If result is NEGATIVE SARS-CoV-2 target nucleic acids are NOT DETECTED. The SARS-CoV-2 RNA is generally detectable in upper and lower  respiratory specimens during the acute phase of infection. The lowest  concentration of SARS-CoV-2 viral copies this assay can detect is 250  copies / mL. A negative result does not preclude SARS-CoV-2 infection  and should not be used as the sole basis for treatment or other  patient management decisions.  A negative result may occur with  improper specimen collection / handling, submission of specimen other  than nasopharyngeal swab, presence of viral mutation(s) within the  areas targeted by this assay, and inadequate number of viral copies  (<250 copies / mL). A negative result must be combined with clinical  observations, patient history, and epidemiological information. If result is POSITIVE SARS-CoV-2 target nucleic acids are DETECTED. The  SARS-CoV-2 RNA is generally detectable in upper and lower  respiratory specimens during the acute phase of infection.  Positive  results are indicative of active infection with SARS-CoV-2.  Clinical  correlation with patient history and other diagnostic information is  necessary to determine patient  infection status.  Positive results do  not rule out bacterial infection or co-infection with other viruses. If result is PRESUMPTIVE POSTIVE SARS-CoV-2 nucleic acids MAY BE PRESENT.   A presumptive positive result was obtained on the submitted specimen  and confirmed on repeat testing.  While 2019 novel coronavirus  (SARS-CoV-2) nucleic acids may be present in the submitted sample  additional confirmatory testing may be necessary for epidemiological  and / or clinical management purposes  to differentiate between  SARS-CoV-2 and other Sarbecovirus currently known to infect humans.  If clinically indicated additional testing with an alternate test  methodology [phone]([id number]) is a dvised. The SARS-CoV-2 RNA is generally  detectable in upper and lower respiratory specimens during the acute  phase of infection. The expected result is Negative. Fact Sheet for Patients:  BoilerBrush.com.cyhttps://www.fda.gov/media/136312/download Fact Sheet for Healthcare Providers: https://pope.com/https://www.fda.gov/media/136313/download This test is not yet approved or cleared by the [place] FDA and has been authorized for detection and/or diagnosis of SARS-CoV-2 by FDA under an Emergency Use Authorization (EUA).  This EUA will remain in effect (meaning this test can be used) for the duration of the COVID-19 declaration under Section 564(b)(1) of the Act, 21 U.S.C. section 360bbb-3(b)(1), unless the authorization is terminated or revoked sooner. Performed at [facility], [address] [place], [place] [address]   Blood Culture (routine x 2)     Status: None   Collection Time: 10/09/18  6:09 PM  Result Value Ref Range Status   Specimen Description BLOOD RIGHT ANTECUBITAL  Final   Special Requests   Final    BOTTLES DRAWN AEROBIC AND ANAEROBIC Blood Culture adequate volume   Culture   Final    NO GROWTH 5 DAYS Performed at [facility], [address] [place], [place] [address]    Report Status 10/14/2018 FINAL   Final  Culture, respiratory (non-expectorated)  Status: None   Collection Time: 10/10/18  9:04 AM  Result Value Ref Range Status   Specimen Description   Final    TRACHEAL ASPIRATE Performed at [facility], [address] [place], [place] [address]    Special Requests   Final    Normal Performed at [facility], [address] [place], [place] [address]    Gram Stain NO WBC SEEN NO ORGANISMS SEEN   Final   Culture   Final    RARE Consistent with normal respiratory flora. Performed at [facility], [address] [place], [place] [address]    Report Status 10/12/2018 FINAL  Final  MRSA PCR Screening     Status: None   Collection Time: 10/11/18 12:31 PM  Result Value Ref Range Status   MRSA by PCR NEGATIVE NEGATIVE Final    Comment:        The GeneXpert MRSA Assay (FDA approved for NASAL specimens only), is one component of a comprehensive MRSA colonization surveillance program. It is not intended to diagnose MRSA infection nor to guide or monitor treatment for MRSA infections. Performed at [facility], [address] [place], [place] [address]       Radiology Studies: No results found.  Scheduled Meds: . chlorhexidine  15 mL Mouth Rinse BID  . enoxaparin (LOVENOX) injection  75 mg Subcutaneous Q12H  . insulin aspart  0-15 Units Subcutaneous Q4H  . insulin aspart  9 Units Subcutaneous Q4H  . insulin detemir  30 Units Subcutaneous Q12H  . mouth rinse  15 mL Mouth Rinse 10 times per day  . mouth rinse  15 mL Mouth Rinse q12n4p  . nutrition supplement (JUVEN)  1 packet Per Tube BID BM  . oxyCODONE  5 mg Oral Q8H  . pantoprazole sodium  40 mg Per Tube QHS   Continuous Infusions: . sodium chloride    . dextrose       LOS: 8 days   Time spent: 35 minutes.  Tyrone Nineyan B Aradhya Shellenbarger, MD [facility] [email or web address] Password [email or web address] 10/17/2018, 9:44 AM

## 2018-10-17 NOTE — Progress Notes (Signed)
Placed on BIPAP for mandatory HS use.  Maintaining VT>350cc

## 2018-10-17 NOTE — Progress Notes (Signed)
PT Cancellation Note  Patient Details Name: Roy Andrews MRN: [id number] DOB: 1970-09-19   Cancelled Treatment:    Reason Eval/Treat Not Completed: Patient at procedure or test/unavailable   Other patient sharing his room is getting a PICC line. Will attempt later   Rexanne Mano, PT 10/17/2018, 1:41 PM

## 2018-10-17 NOTE — Evaluation (Signed)
Clinical/Bedside Swallow Evaluation Patient Details  Name: Roy Andrews MRN: [id number] Date of Birth: 1970-08-27  Today's Date: 10/17/2018 Time: SLP Start Time (ACUTE ONLY): 1542 SLP Stop Time (ACUTE ONLY): 1602 SLP Time Calculation (min) (ACUTE ONLY): 20 min  Past Medical History:  Past Medical History:  Diagnosis Date  . Diabetes mellitus without complication (HCC)   . Obese    Past Surgical History: History reviewed. No pertinent surgical history. HPI:  48 y.o. Spanish-speaking male with a history of obesity and OSA who presented with progressive weakness 10/09/18. EMS reported SpO2 in the 30%s on their arrival, improved with CPAP en route. CXR showed bilateral infiltrates and covid-19 testing was positive. The patient was intubated, given cefepime, steroids, and convalescent plasma. He was extubated 6/9.  Dx acute hypoxic and hypercarbic respiratory failure due to COVID-19 pna, septic shock.    Assessment / Plan / Recommendation Clinical Impression  [employer] service utilized by phone to communicate with [name]. Pt oriented to person, location. He appeared to understand basic, social communication in [place]. He presents with adequate swallowing abilities with normal respiratory/swallow synchrony (has weaned to 6 liters ).  Oral mechanism exam unremarkable excluding small lesion left tongue from oral intubation.  Voice quality is hypophonic, but clear quality. There were no overt s/s of aspiration after consumption of successive boluses of thin liquid.  Pt self-fed pudding with assist needed to grip spoon, but mastication and toleration of purees were functional.  Recommend continuing clear liquids until cleared by MD to advance to solids.  SLP will follow briefly for safety/toleration of advanced diet.   SLP Visit Diagnosis: Dysphagia, unspecified (R13.10)    Aspiration Risk  Mild aspiration risk    Diet Recommendation   clears per MD  Medication  Administration: Whole meds with liquid    Other  Recommendations Oral Care Recommendations: Oral care BID   Follow up Recommendations None      Frequency and Duration min 2x/week  1 week       Prognosis Prognosis for Safe Diet Advancement: Good      Swallow Study   General Date of Onset: 10/09/18 HPI: 48 y.o. Spanish-speaking male with a history of obesity and OSA who presented with progressive weakness 10/09/18. EMS reported SpO2 in the 30%s on their arrival, improved with CPAP en route. CXR showed bilateral infiltrates and covid-19 testing was positive. The patient was intubated, given cefepime, steroids, and convalescent plasma. He was extubated 6/9.  Dx acute hypoxic and hypercarbic respiratory failure due to COVID-19 pna, septic shock.  Type of Study: Bedside Swallow Evaluation Previous Swallow Assessment: no Diet Prior to this Study: Thin liquids Temperature Spikes Noted: Yes(101) Respiratory Status: Nasal cannula(6 liters) History of Recent Intubation: Yes Length of Intubations (days): 7 days Date extubated: 10/16/18 Behavior/Cognition: Alert;Cooperative Oral Cavity Assessment: Within Functional Limits(small lesion left tongue ) Oral Care Completed by SLP: Recent completion by staff Oral Cavity - Dentition: Adequate natural dentition Vision: Functional for self-feeding Self-Feeding Abilities: Needs assist Patient Positioning: Upright in bed;Partially reclined Baseline Vocal Quality: Low vocal intensity Volitional Cough: Weak Volitional Swallow: Able to elicit    Oral/Motor/Sensory Function Overall Oral Motor/Sensory Function: Within functional limits   Ice Chips Ice chips: Not tested   Thin Liquid Thin Liquid: Within functional limits    Nectar Thick Nectar Thick Liquid: Not tested   Honey Thick Honey Thick Liquid: Not tested   Puree Puree: Within functional limits   Solid     Solid: Not tested  Almond Quam Laurice 10/17/2018,4:26 PM   Estill Bamberg L.  Tivis Ringer, [place] Office number [phone]

## 2018-10-17 NOTE — Progress Notes (Signed)
2000 assessment noticed that central line is about 15cm out. [place] Pink, MD notified via text page. Will remove and establish peripheral access. Will continue to closely monitor.

## 2018-10-17 NOTE — Progress Notes (Signed)
NAME:  Roy RubensJuan Antonio Paredes Andrews, MRN:  [id number], DOB:  09/14/1970, LOS: 8 ADMISSION DATE:  10/09/2018, CONSULTATION DATE: October 09, 2018 REFERRING MD: Dr. Maryfrances Bunnellanford, CHIEF COMPLAINT: Dyspnea  Brief History   48 year old male with a past medical history significant for obesity hypertension and diabetes admitted with ARDS due to COVID-19 pneumonia.  Past Medical History  Obesity Hypertension Diabetes mellitus type 2  Significant Hospital Events   6/2 Intubation 6/3- Proned 6/4- Prone and diuresis 6/5- Prone and diuresis 6/6- prone 6/7- prone 6/8 starting to wean vent 6/9 Extubated  Consults:  10/09/18 PCCM  Procedures:  10/09/18 ETT > 6/9 6/2 L IJ CVL >   Significant Diagnostic Tests:  10/09/18 CXR with bilateral infiltrates  Micro Data:  BCX 6/2 - pending Trach asp 6/3 - normal flora  Antimicrobials/COVID treatment:  Vanc 6/2>6/4 Cefepime 6/2> Convalescent plasma 6/3  Interim history/subjective:   Extubated yesterday Tolerated BiPAP last night Drowsy but has been able to talk to his family, stand at the bedside Wants to eat and drink today  Objective   Blood pressure 136/87, pulse (!) 114, temperature 99.4 F (37.4 C), temperature source Axillary, resp. rate (!) 22, height 5\' 11"  (1.803 m), weight (!) 144.6 kg, SpO2 98 %.    Vent Mode: BIPAP FiO2 (%):  [40 %-80 %] 40 % Set Rate:  [14 bmp] 14 bmp PEEP:  [5 cmH20] 5 cmH20 Pressure Support:  [5 cmH20] 5 cmH20   Intake/Output Summary (Last 24 hours) at 10/17/2018 0727 Last data filed at 10/17/2018 0300 Gross per 24 hour  Intake 396.26 ml  Output 1935 ml  Net -1538.74 ml   Filed Weights   10/15/18 0130 10/16/18 0500 10/17/18 0500  Weight: (!) 151 kg (!) 148 kg (!) 144.6 kg    Examination:  General:  Resting comfortably in bed HENT: NCAT OP clear PULM: CTA B, normal effort CV: RRR, no mgr GI: BS+, soft, nontender MSK: normal bulk and tone Neuro: awake, alert, no distress, MAEW  October 14, 2018 chest  x-ray images independently reviewed showing support apparatus in place, improved bibasilar airspace disease the radiology is concerned about increased consolidation in the left base, retrocardiac area  Resolved Hospital Problem list     Assessment & Plan:  ARDS secondary to COVID-19 pneumonia: Oxygenation continues to improve Likely obesity hypoventilation syndrome Likely diastolic heart failure and acute pulmonary edema Continue BiPAP at night, goal pressures less than 20 cm of water to prevent aerosolization Monitor carefully in ICU Hold lasix today Wean off high flow to maintain O2 saturation > 88% Mobilize, out of bed to chair Stand today   Hypernatremia: Let him drink water today    Best practice:  Diet: clears Pain/Anxiety/Delirium protocol (if indicated): d/c VAP protocol (if indicated): d/c DVT prophylaxis: lovenox bid per COVID Protocol GI prophylaxis: Pantoprazole for stress ulcer prophylaxis Glucose control: per TRH Mobility: bed rest Code Status: full Family Communication: updated his wife 6/9, will call again today Disposition: remain in icu  Labs   CBC: Recent Labs  Lab 10/13/18 0310 10/14/18 0400 10/14/18 0554 10/15/18 0205 10/16/18 0423 10/17/18 0205  WBC 14.8* 12.8*  --  11.2* 11.5* 13.8*  HGB 13.6 14.5 14.3 14.6 15.1 15.8  HCT 44.5 47.4 42.0 48.3 53.8* 52.7*  MCV 94.1 95.6  --  98.0 102.5* 99.4  PLT 271 291  --  275 262 253    Basic Metabolic Panel: Recent Labs  Lab 10/11/18 1116  10/12/18 0115  10/12/18 1815 10/13/18 0310 10/14/18  0400 10/14/18 0554 10/15/18 0205 10/16/18 0423 10/17/18 0205  NA  --    < > 136   < >  --  142 146* 147* 147* 151* 155*  K  --    < > 4.2   < >  --  4.3 4.2 4.3 4.3 4.7 3.8  CL  --   --  101  --   --  107 109  --  109 108 109  CO2  --   --  27  --   --  28 29  --  30 37* 35*  GLUCOSE  --   --  230*  --   --  181* 171*  --  297* 203* 108*  BUN  --   --  59*  --   --  72* 78*  --  84* 62* 47*  CREATININE   --   --  1.61*  --   --  1.46* 1.45*  --  1.41* 1.11 1.28*  CALCIUM  --   --  7.7*  --   --  8.5* 8.7*  --  8.8* 9.2 9.3  MG 2.8*  --  2.8*  --  3.0*  --   --   --   --   --   --   PHOS 4.3  --  3.3  --  3.8  --   --   --   --   --   --    < > = values in this interval not displayed.   GFR: Estimated Creatinine Clearance: 105.1 mL/min (A) (by C-G formula based on SCr of 1.28 mg/dL (H)). Recent Labs  Lab 10/10/18 1545  10/11/18 0400 10/12/18 0115  10/14/18 0400 10/15/18 0205 10/16/18 0423 10/17/18 0205  PROCALCITON 1.92  --  2.10 1.03  --   --   --   --   --   WBC  --    < > 17.6* 18.5*   < > 12.8* 11.2* 11.5* 13.8*   < > = values in this interval not displayed.    Liver Function Tests: Recent Labs  Lab 10/13/18 0310 10/14/18 0400 10/15/18 0205 10/16/18 0423 10/17/18 0205  AST 74* 49* 30 22 61*  ALT 289* 206* 147* 104* 91*  ALKPHOS 52 51 48 54 59  BILITOT 0.4 0.4 0.6 0.4 1.1  PROT 6.7 7.1 6.8 7.4 7.7  ALBUMIN 2.3* 2.5* 2.5* 2.7* 2.8*   No results for input(s): LIPASE, AMYLASE in the last 168 hours. No results for input(s): AMMONIA in the last 168 hours.  ABG    Component Value Date/Time   PHART 7.466 (H) 10/14/2018 0554   PCO2ART 40.9 10/14/2018 0554   PO2ART 69.0 (L) 10/14/2018 0554   HCO3 29.4 (H) 10/14/2018 0554   TCO2 31 10/14/2018 0554   ACIDBASEDEF 1.0 10/11/2018 1134   O2SAT 94.0 10/14/2018 0554     Coagulation Profile: Recent Labs  Lab 10/11/18 0400 10/12/18 0115 10/13/18 0310 10/14/18 0400 10/15/18 0205  INR 1.1 1.1 1.2 1.2 1.2    Cardiac Enzymes: Recent Labs  Lab 10/10/18 1545  CKTOTAL 1,256*  TROPONINI <0.03    HbA1C: Hgb A1c MFr Bld  Date/Time Value Ref Range Status  10/11/2018 04:00 AM 10.0 (H) 4.8 - 5.6 % Final    Comment:    (NOTE) Pre diabetes:          5.7%-6.4% Diabetes:              >6.4% Glycemic control for   <  7.0% adults with diabetes     CBG: Recent Labs  Lab 10/16/18 0741 10/16/18 1157 10/16/18 1623  10/16/18 2011 10/17/18 0323  GLUCAP 143* 270* 205* 199* 121*     Critical care time: 35 minutes    Roselie Awkward, MD [place] [facility] Pager: [phone] Cell: [phone] If no response, call [phone]

## 2018-10-17 NOTE — Progress Notes (Addendum)
Occupational Therapy Treatment Patient Details Name: Roy Andrews MRN: [id number] DOB: 1970-09-06 Today's Date: 10/17/2018    History of present illness 48 y.o. M with obesity who presented with malaise, lethargy and weakness, progressive over several days. +COVID Intubated and septic shock; extubated 10/16/18   OT comments  PTA, pt was independent and living with his wife; limited information collected with pt being soft spoken due to soreness at his throat from intubation; spanish interpreter used throughout. Pt currently requiring Min A for UB ADLs, Mod A for LB ADLs, and Min-Mod A +2 for functional transfers. Pt presenting with weakness, poor balance, and decreased activity tolerance. With activity, HR at 125, RR 20s, and SpO2 >90% on 10L via HFNC. Pt will require from further acute OT to facilitate safe dc. Recommend dc to CIR for intensive OT to optimize safety, independence with ADLs, and return to PLOF.     Follow Up Recommendations  CIR;Supervision/Assistance - 24 hour    Equipment Recommendations  3 in 1 bedside commode    Recommendations for Other Services PT consult;Speech consult;Rehab consult    Precautions / Restrictions Precautions Precautions: Fall Restrictions Weight Bearing Restrictions: No       Mobility Bed Mobility               General bed mobility comments: up in recliner and did not want to return to bed  Transfers Overall transfer level: Needs assistance Equipment used: 2 person hand held assist Transfers: Sit to/from Stand Sit to Stand: Min assist;Mod assist;+2 physical assistance;+2 safety/equipment         General transfer comment: x2; first mod assist, second min assist    Balance Overall balance assessment: Needs assistance Sitting-balance support: No upper extremity supported;Feet supported Sitting balance-Leahy Scale: Fair     Standing balance support: Bilateral upper extremity supported Standing balance-Leahy  Scale: Poor Standing balance comment: initial stand very unsteady with lean to right; second stand much better                            ADL either performed or assessed with clinical judgement   ADL Overall ADL's : Needs assistance/impaired Eating/Feeding: NPO   Grooming: Set up;Supervision/safety;Sitting   Upper Body Bathing: Minimal assistance;Sitting   Lower Body Bathing: Moderate assistance;Sit to/from stand;+2 for physical assistance;+2 for safety/equipment   Upper Body Dressing : Moderate assistance;Sitting   Lower Body Dressing: Moderate assistance;Sit to/from stand;+2 for physical assistance;+2 for safety/equipment Lower Body Dressing Details (indicate cue type and reason): Pt able to reach down to adjust socks. Increased heavy breathing and fatigue with forward bending               General ADL Comments: Pt performing sit<>stand twice at recliner with Min-Mod A +2. Presenting with decreased activity tolerance nad requiring 10L O2     Vision Baseline Vision/History: No visual deficits Patient Visual Report: Other (comment)(Left eye swollen; rn reports from proning)     Perception     Praxis      Cognition Arousal/Alertness: Lethargic Behavior During Therapy: WFL for tasks assessed/performed Overall Cognitive Status: No family/caregiver present to determine baseline cognitive functioning                                 General Comments: general slowed responses (?partially due to use of interpreter); frequent closing eyes (?due to swelling left eye--from proning per RN)  Exercises     Shoulder Instructions       General Comments SpO2 >90% on 10L O2 via HFNC. HR 123 and RR 20s with activity    Pertinent Vitals/ Pain       Pain Assessment: Faces Faces Pain Scale: No hurt  Home Living Family/patient expects to be discharged to:: Private residence Living Arrangements: Spouse/significant other;Children Available Help at  Discharge: Family Type of Home: House Home Access: Stairs to enter Technical brewer of Steps: 3   Home Layout: One level     Bathroom Shower/Tub: Occupational psychologist: Standard     Home Equipment: None          Prior Functioning/Environment Level of Independence: Independent        Comments: voice is weak/sore throat; nods yes that he works   Frequency  Min 3X/week        Progress Toward Goals  OT Goals(current goals can now be found in the care plan section)     Acute Rehab OT Goals Patient Stated Goal: agrees to goal to increase strength, mobility OT Goal Formulation: With patient Time For Goal Achievement: 10/31/18 Potential to Achieve Goals: Good  Plan      Co-evaluation    PT/OT/SLP Co-Evaluation/Treatment: Yes Reason for Co-Treatment: Complexity of the patient's impairments (multi-system involvement);For patient/therapist safety;To address functional/ADL transfers PT goals addressed during session: Mobility/safety with mobility;Balance OT goals addressed during session: ADL's and self-care      AM-PAC OT "6 Clicks" Daily Activity     Outcome Measure   Help from another person eating meals?: Total Help from another person taking care of personal grooming?: A Little Help from another person toileting, which includes using toliet, bedpan, or urinal?: A Lot Help from another person bathing (including washing, rinsing, drying)?: A Lot Help from another person to put on and taking off regular upper body clothing?: A Little Help from another person to put on and taking off regular lower body clothing?: A Lot 6 Click Score: 13    End of Session Equipment Utilized During Treatment: Oxygen(10L)  OT Visit Diagnosis: Unsteadiness on feet (R26.81);Other abnormalities of gait and mobility (R26.89);Muscle weakness (generalized) (M62.81)   Activity Tolerance Patient tolerated treatment well   Patient Left in chair;with call bell/phone  within reach;with nursing/sitter in room   Nurse Communication Mobility status        Time: 5916-3846 OT Time Calculation (min): 21 min  Charges: OT General Charges $OT Visit: 1 Visit OT Evaluation $OT Eval Moderate Complexity: [place], OTR/L Acute Rehab Pager: [phone] Office: [place] 10/17/2018, 4:46 PM

## 2018-10-17 NOTE — Progress Notes (Signed)
OT Cancellation Note  Patient Details Name: Roy Andrews MRN: [id number] DOB: [birth year]   Cancelled Treatment:    Reason Eval/Treat Not Completed: Other (comment)(Other patient sharing his room is getting a PICC line. Will return as schedule allows. Thank you.)  [place], OTR/L Acute Rehab Pager: [phone] Office: [phone] 10/17/2018, 1:50 PM

## 2018-10-17 NOTE — Progress Notes (Signed)
Guadalupe, spouse called and updated. Fellow RN Abby translated due to technical difficulties with the [employer] She was updated on current plan of care, VS and was allowed time to ask questions. Patient also spoke with her. She was thankful for the update.

## 2018-10-17 NOTE — Progress Notes (Signed)
Pt removed from bipap at this time per pt request. Pt placed on HFNC at 20LPM and 40% FiO2.

## 2018-10-17 NOTE — Progress Notes (Signed)
Wife called phone number back and was able to speak with patient.

## 2018-10-17 NOTE — Progress Notes (Signed)
[facility] PCCM  Updated his wife by phone today  Roselie Awkward, MD [place] [facility] Pager: [phone] Cell: [phone] If no response, call [phone]

## 2018-10-18 DIAGNOSIS — J80 Acute respiratory distress syndrome: Secondary | ICD-10-CM | POA: Diagnosis not present

## 2018-10-18 DIAGNOSIS — U071 COVID-19: Secondary | ICD-10-CM | POA: Diagnosis not present

## 2018-10-18 DIAGNOSIS — J9601 Acute respiratory failure with hypoxia: Secondary | ICD-10-CM | POA: Diagnosis not present

## 2018-10-18 LAB — COMPREHENSIVE METABOLIC PANEL
ALT: 69 U/L — ABNORMAL HIGH (ref 0–44)
AST: 30 U/L (ref 15–41)
Albumin: 2.6 g/dL — ABNORMAL LOW (ref 3.5–5.0)
Alkaline Phosphatase: 54 U/L (ref 38–126)
Anion gap: 10 (ref 5–15)
BUN: 45 mg/dL — ABNORMAL HIGH (ref 6–20)
CO2: 30 mmol/L (ref 22–32)
Calcium: 8.2 mg/dL — ABNORMAL LOW (ref 8.9–10.3)
Chloride: 99 mmol/L (ref 98–111)
Creatinine, Ser: 1.02 mg/dL (ref 0.61–1.24)
GFR calc Af Amer: >60 mL/min (ref >60)
GFR calc non Af Amer: >60 mL/min (ref >60)
Glucose, Bld: 260 mg/dL — ABNORMAL HIGH (ref 70–99)
Potassium: 3.8 mmol/L (ref 3.5–5.1)
Sodium: 139 mmol/L (ref 135–145)
Total Bilirubin: 0.9 mg/dL (ref 0.3–1.2)
Total Protein: 7 g/dL (ref 6.5–8.1)

## 2018-10-18 LAB — CBC
HCT: 46.5 % (ref 39.0–52.0)
Hemoglobin: 14.5 g/dL (ref 13.0–17.0)
MCH: 30 pg (ref 26.0–34.0)
MCHC: 31.2 g/dL (ref 30.0–36.0)
MCV: 96.3 fL (ref 80.0–100.0)
Platelets: 220 10*3/uL (ref 150–400)
RBC: 4.83 MIL/uL (ref 4.22–5.81)
RDW: 13.2 % (ref 11.5–15.5)
WBC: 12.4 10*3/uL — ABNORMAL HIGH (ref 4.0–10.5)
nRBC: 0 % (ref 0.0–0.2)

## 2018-10-18 LAB — C-REACTIVE PROTEIN: CRP: 5.6 mg/dL — ABNORMAL HIGH (ref <1.0)

## 2018-10-18 LAB — FERRITIN: Ferritin: 2068 ng/mL — ABNORMAL HIGH (ref 24–336)

## 2018-10-18 LAB — GLUCOSE, CAPILLARY
Glucose-Capillary: 228 mg/dL — ABNORMAL HIGH (ref 70–99)
Glucose-Capillary: 224 mg/dL — ABNORMAL HIGH (ref 70–99)
Glucose-Capillary: 200 mg/dL — ABNORMAL HIGH (ref 70–99)
Glucose-Capillary: 170 mg/dL — ABNORMAL HIGH (ref 70–99)
Glucose-Capillary: 172 mg/dL — ABNORMAL HIGH (ref 70–99)

## 2018-10-18 MED ORDER — ENSURE ENLIVE PO LIQD
237.0000 mL | Freq: Two times a day (BID) | ORAL | Status: DC
  Administered 2018-10-18 – 2018-10-22 (×4): 237 mL via ORAL
  Filled 2018-10-18 (×5): qty 237

## 2018-10-18 NOTE — Plan of Care (Signed)
Patient progressing appropriately. Will continue to monitor.

## 2018-10-18 NOTE — Progress Notes (Signed)
Spouse, Dulce called using the translator and updated on plan of care for the evening. All questions answered. Patient also given time to talk with his wife. She was thankful for the update.

## 2018-10-18 NOTE — Progress Notes (Addendum)
NAME:  Roy Andrews, MRN:  [id number], DOB:  [birth year], LOS: 9 ADMISSION DATE:  10/09/2018, CONSULTATION DATE: October 09, 2018 REFERRING MD: Dr. Maryfrances Bunnellanford, CHIEF COMPLAINT: Dyspnea  Brief History   48 year old male with a past medical history significant for obesity hypertension and diabetes admitted with ARDS due to COVID-19 pneumonia.  Past Medical History  Obesity Hypertension Diabetes mellitus type 2  Significant Hospital Events   6/2 Intubation 6/3- Proned 6/4- Prone and diuresis 6/5- Prone and diuresis 6/6- prone 6/7- prone 6/8 starting to wean vent 6/9 Extubated 6/11 On room air  Consults:  10/09/18 PCCM  Procedures:  10/09/18 ETT > 6/9 6/2 L IJ CVL > 6/10  Significant Diagnostic Tests:    Micro Data:  BCX 6/2 - pending Trach asp 6/3 - normal flora  Antimicrobials/COVID treatment:  Vanc 6/2>6/4 Cefepime 6/2> 6/6 Convalescent plasma 6/3 Remdesivir started 6/10   Interim history/subjective:  Stable overnight Sitting up in chair, on room air with no complaints Did not use BiPAP yesterday night  Objective   Blood pressure 116/73, pulse 86, temperature 97.9 F (36.6 C), temperature source Oral, resp. rate (!) 24, height 5\' 11"  (1.803 m), weight (!) 144.6 kg, SpO2 100 %.    Vent Mode: BIPAP FiO2 (%):  [40 %] 40 % Set Rate:  [14 bmp] 14 bmp PEEP:  [5 cmH20] 5 cmH20   Intake/Output Summary (Last 24 hours) at 10/18/2018 0828 Last data filed at 10/18/2018 0600 Gross per 24 hour  Intake 3110 ml  Output 1550 ml  Net 1560 ml   Filed Weights   10/15/18 0130 10/16/18 0500 10/17/18 0500  Weight: (!) 151 kg (!) 148 kg (!) 144.6 kg    Examination: Gen:      Obese HEENT:  EOMI, sclera anicteric Neck:     No masses; no thyromegaly Lungs:    Clear to auscultation bilaterally; normal respiratory effort CV:         Regular rate and rhythm; no murmurs Abd:      + bowel sounds; soft, non-tender; no palpable masses, no distension Ext:    No edema;  adequate peripheral perfusion Skin:      Warm and dry; no rash Neuro: alert and oriented x 3 Psych: normal mood and affect  Resolved Hospital Problem list     Assessment & Plan:  ARDS secondary to COVID-19 pneumonia: Oxygenation continues to improve Likely obesity hypoventilation syndrome Likely diastolic heart failure and acute pulmonary edema Continue BiPAP at night.  Discussed with patient that he needs to use this every night to prevent recurrent respiratory failure Mobilize, out of bed PT to work with patient  Patient not given Remdesivir on admission due to elevated LFTs.  It was started yesterday as CRP was increasing and LFTs are better. Follow LFTs on therapy   Hypernatremia> improving Monitor labs   PCCM will be available as needed. Please call with questions.  Best practice:  Diet: Regular Pain/Anxiety/Delirium protocol (if indicated): d/c VAP protocol (if indicated): d/c DVT prophylaxis: lovenox bid per COVID Protocol GI prophylaxis: Pantoprazole for stress ulcer prophylaxis Glucose control: per TRH Mobility: bed rest Code Status: full Family Communication: Called wife 6/11 but got voice mail. Left a message Disposition: remain in icu due to Bipap needs  Labs   CBC: Recent Labs  Lab 10/14/18 0400 10/14/18 0554 10/15/18 0205 10/16/18 0423 10/17/18 0205 10/18/18 0610  WBC 12.8*  --  11.2* 11.5* 13.8* 12.4*  HGB 14.5 14.3 14.6 15.1 15.8 14.5  HCT  47.4 42.0 48.3 53.8* 52.7* 46.5  MCV 95.6  --  98.0 102.5* 99.4 96.3  PLT 291  --  275 262 253 174    Basic Metabolic Panel: Recent Labs  Lab 10/11/18 1116  10/12/18 0115  10/12/18 1815  10/14/18 0400 10/14/18 0554 10/15/18 0205 10/16/18 0423 10/17/18 0205 10/18/18 0610  NA  --    < > 136   < >  --    < > 146* 147* 147* 151* 155* 139  K  --    < > 4.2   < >  --    < > 4.2 4.3 4.3 4.7 3.8 3.8  CL  --   --  101  --   --    < > 109  --  109 108 109 99  CO2  --   --  27  --   --    < > 29  --  30 37*  35* 30  GLUCOSE  --   --  230*  --   --    < > 171*  --  297* 203* 108* 260*  BUN  --   --  59*  --   --    < > 78*  --  84* 62* 47* 45*  CREATININE  --   --  1.61*  --   --    < > 1.45*  --  1.41* 1.11 1.28* 1.02  CALCIUM  --   --  7.7*  --   --    < > 8.7*  --  8.8* 9.2 9.3 8.2*  MG 2.8*  --  2.8*  --  3.0*  --   --   --   --   --   --   --   PHOS 4.3  --  3.3  --  3.8  --   --   --   --   --   --   --    < > = values in this interval not displayed.   GFR: Estimated Creatinine Clearance: 131.8 mL/min (by C-G formula based on SCr of 1.02 mg/dL). Recent Labs  Lab 10/12/18 0115  10/15/18 0205 10/16/18 0423 10/17/18 0205 10/18/18 0610  PROCALCITON 1.03  --   --   --   --   --   WBC 18.5*   < > 11.2* 11.5* 13.8* 12.4*   < > = values in this interval not displayed.    Liver Function Tests: Recent Labs  Lab 10/14/18 0400 10/15/18 0205 10/16/18 0423 10/17/18 0205 10/18/18 0610  AST 49* 30 22 61* 30  ALT 206* 147* 104* 91* 69*  ALKPHOS 51 48 54 59 54  BILITOT 0.4 0.6 0.4 1.1 0.9  PROT 7.1 6.8 7.4 7.7 7.0  ALBUMIN 2.5* 2.5* 2.7* 2.8* 2.6*   No results for input(s): LIPASE, AMYLASE in the last 168 hours. No results for input(s): AMMONIA in the last 168 hours.  ABG    Component Value Date/Time   PHART 7.466 (H) 10/14/2018 0554   PCO2ART 40.9 10/14/2018 0554   PO2ART 69.0 (L) 10/14/2018 0554   HCO3 29.4 (H) 10/14/2018 0554   TCO2 31 10/14/2018 0554   ACIDBASEDEF 1.0 10/11/2018 1134   O2SAT 94.0 10/14/2018 0554     Coagulation Profile: Recent Labs  Lab 10/12/18 0115 10/13/18 0310 10/14/18 0400 10/15/18 0205  INR 1.1 1.2 1.2 1.2    Cardiac Enzymes: No results for input(s): CKTOTAL, CKMB, CKMBINDEX, TROPONINI in the last 168  hours.  HbA1C: Hgb A1c MFr Bld  Date/Time Value Ref Range Status  10/11/2018 04:00 AM 10.0 (H) 4.8 - 5.6 % Final    Comment:    (NOTE) Pre diabetes:          5.7%-6.4% Diabetes:              >6.4% Glycemic control for   <7.0% adults  with diabetes     CBG: Recent Labs  Lab 10/17/18 0746 10/17/18 1153 10/17/18 1641 10/17/18 1952 10/18/18 0752  GLUCAP 151* 161* 165* 229* 228*    Meshelle Holness MD [facility] Pager [phone] If no answer call [phone] 10/18/2018, 8:28 AM

## 2018-10-18 NOTE — Progress Notes (Signed)
PROGRESS NOTE  Roy Andrews  ZOX:[id number] DOB: [birth year] DOA: 10/09/2018 PCP: Patient, No Pcp Per   Brief Narrative: Roy Andrews is a 48 y.o. male with a history of obesity and OSA who presented with progressive weakness. EMS reported SpO2 in the 30%'s on their arrival, improved with CPAP en route. CXR showed bilateral infiltrates and covid-19 testing was positive. The patient was intubated, given cefepime, steroids, and convalescent plasma. LFTs and creatinine were elevated on admission, so remdesivir was not given initially. He was extubated 6/9 and remdesivir is now started with improvement in LFTs and elevation in inflammatory markers.   Assessment & Plan: Active Problems:   COVID-19   Acute respiratory failure with hypoxemia (HCC)   Acute respiratory distress syndrome (ARDS) due to COVID-19 virus   Pressure injury of skin  Septic shock and acute hypoxic and hypercarbic respiratory failure due to covid-19 pneumonia: - Continue airborne, contact precautions. PPE including surgical gown, gloves, face shield, cap, shoe covers, and N-95 used during this encounter in a negative pressure room.  - Check daily labs: CBC w/diff, CMP, d-dimer, fibrinogen, ferritin, LDH, CRP - Maintain euvolemia/net negative.  - Avoid NSAIDs - Continue prone/incentive spirometry. - Started remdesivir, planning 5 day course (6/10 - 6/14).   - Tracheal aspirate grew OPF, MRSA PCR negative, stop abx  LFT elevation: Likely from hypoxic injury, improving. - Hepatitis serology, HIV, and INR all negative/normal.   Troponin elevation: Mild, flat trend  AKI: Creatinine improved. CrCl now >2490ml/min. - Avoid nephrotoxins  Hypernatremia:  - Allow to drink water ad lib, monitor.  Obesity: BMI 44.  - Optimize nutritional status  T2DM: HbA1c 10%.  - Continue insulin  Stage II sacral pressure injury POA: This is documented previously. Not examined personally. - Offload as  able  DVT prophylaxis: Lovenox Code Status: Full Family Communication: PCCM to update family Disposition Plan: Remain in ICU due to need for qHS NIPPV. Needs PT/OT  Consultants:   PCCM  Procedures:   ETT 6/3 >  Arterial line 6/3 >  Antimicrobials:  Vancomycin 6/2 >> 6/3  Cefepime 6/2 >> 6/7  Subjective: Sitting in bedside chair, only mild shortness of breath at rest. No chest pain.   Objective: Vitals:   10/18/18 1100 10/18/18 1200 10/18/18 1300 10/18/18 1400  BP: 119/77 100/73 124/81 (!) 136/92  Pulse: 86 71 61 92  Resp: (!) 21 (!) 25 (!) 21 (!) 26  Temp:      TempSrc:      SpO2: (!) 87% 99% 100% 99%  Weight:      Height:        Intake/Output Summary (Last 24 hours) at 10/18/2018 1435 Last data filed at 10/18/2018 1400 Gross per 24 hour  Intake 4190 ml  Output 1775 ml  Net 2415 ml   Filed Weights   10/15/18 0130 10/16/18 0500 10/17/18 0500  Weight: (!) 151 kg (!) 148 kg (!) 144.6 kg   Gen: 48 y.o. obese male in no distress Pulm: Nonlabored breathing room air. Clear, diminished. CV: Regular rate and rhythm. No murmur, rub, or gallop. No JVD, no dependent edema. GI: Abdomen soft, non-tender, non-distended, with normoactive bowel sounds.  Ext: Warm, no deformities Skin: No rashes, lesions or ulcers on visualized skin. Neuro: Alert and oriented. No focal neurological deficits. Psych: Judgement and insight appear fair. Mood euthymic & affect congruent. Behavior is appropriate.  Data Reviewed: I have personally reviewed following labs and imaging studies  CBC: Recent Labs  Lab 10/14/18  0400 10/14/18 0554 10/15/18 0205 10/16/18 0423 10/17/18 0205 10/18/18 0610  WBC 12.8*  --  11.2* 11.5* 13.8* 12.4*  HGB 14.5 14.3 14.6 15.1 15.8 14.5  HCT 47.4 42.0 48.3 53.8* 52.7* 46.5  MCV 95.6  --  98.0 102.5* 99.4 96.3  PLT 291  --  275 262 253 220   Basic Metabolic Panel: Recent Labs  Lab 10/12/18 0115  10/12/18 1815  10/14/18 0400 10/14/18 0554  10/15/18 0205 10/16/18 0423 10/17/18 0205 10/18/18 0610  NA 136   < >  --    < > 146* 147* 147* 151* 155* 139  K 4.2   < >  --    < > 4.2 4.3 4.3 4.7 3.8 3.8  CL 101  --   --    < > 109  --  109 108 109 99  CO2 27  --   --    < > 29  --  30 37* 35* 30  GLUCOSE 230*  --   --    < > 171*  --  297* 203* 108* 260*  BUN 59*  --   --    < > 78*  --  84* 62* 47* 45*  CREATININE 1.61*  --   --    < > 1.45*  --  1.41* 1.11 1.28* 1.02  CALCIUM 7.7*  --   --    < > 8.7*  --  8.8* 9.2 9.3 8.2*  MG 2.8*  --  3.0*  --   --   --   --   --   --   --   PHOS 3.3  --  3.8  --   --   --   --   --   --   --    < > = values in this interval not displayed.   GFR: Estimated Creatinine Clearance: 131.8 mL/min (by C-G formula based on SCr of 1.02 mg/dL). Liver Function Tests: Recent Labs  Lab 10/14/18 0400 10/15/18 0205 10/16/18 0423 10/17/18 0205 10/18/18 0610  AST 49* 30 22 61* 30  ALT 206* 147* 104* 91* 69*  ALKPHOS 51 48 54 59 54  BILITOT 0.4 0.6 0.4 1.1 0.9  PROT 7.1 6.8 7.4 7.7 7.0  ALBUMIN 2.5* 2.5* 2.7* 2.8* 2.6*   No results for input(s): LIPASE, AMYLASE in the last 168 hours. No results for input(s): AMMONIA in the last 168 hours. Coagulation Profile: Recent Labs  Lab 10/12/18 0115 10/13/18 0310 10/14/18 0400 10/15/18 0205  INR 1.1 1.2 1.2 1.2   Cardiac Enzymes: No results for input(s): CKTOTAL, CKMB, CKMBINDEX, TROPONINI in the last 168 hours. BNP (last 3 results) No results for input(s): PROBNP in the last 8760 hours. HbA1C: No results for input(s): HGBA1C in the last 72 hours. CBG: Recent Labs  Lab 10/17/18 1153 10/17/18 1641 10/17/18 1952 10/18/18 0752 10/18/18 1217  GLUCAP 161* 165* 229* 228* 224*   Lipid Profile: No results for input(s): CHOL, HDL, LDLCALC, TRIG, CHOLHDL, LDLDIRECT in the last 72 hours. Thyroid Function Tests: No results for input(s): TSH, T4TOTAL, FREET4, T3FREE, THYROIDAB in the last 72 hours. Anemia Panel: Recent Labs    10/17/18 0205  10/18/18 0610  FERRITIN 2,196* 2,068*   Urine analysis: No results found for: COLORURINE, APPEARANCEUR, LABSPEC, PHURINE, GLUCOSEU, HGBUR, BILIRUBINUR, KETONESUR, PROTEINUR, UROBILINOGEN, NITRITE, LEUKOCYTESUR Recent Results (from the past 240 hour(s))  SARS Coronavirus 2 (CEPHEID- Performed in [facility] hospital lab), Hosp Order     Status: Abnormal  Collection Time: 10/09/18  5:30 PM   Specimen: Nasopharyngeal Swab  Result Value Ref Range Status   SARS Coronavirus 2 POSITIVE (A) NEGATIVE Final    Comment: RESULT CALLED TO, READ BACK BY AND VERIFIED WITH: R HARDY RN [id number] 10/09/18 A BROWNING (NOTE) If result is NEGATIVE SARS-CoV-2 target nucleic acids are NOT DETECTED. The SARS-CoV-2 RNA is generally detectable in upper and lower  respiratory specimens during the acute phase of infection. The lowest  concentration of SARS-CoV-2 viral copies this assay can detect is 250  copies / mL. A negative result does not preclude SARS-CoV-2 infection  and should not be used as the sole basis for treatment or other  patient management decisions.  A negative result may occur with  improper specimen collection / handling, submission of specimen other  than nasopharyngeal swab, presence of viral mutation(s) within the  areas targeted by this assay, and inadequate number of viral copies  (<250 copies / mL). A negative result must be combined with clinical  observations, patient history, and epidemiological information. If result is POSITIVE SARS-CoV-2 target nucleic acids are DETECTED. The  SARS-CoV-2 RNA is generally detectable in upper and lower  respiratory specimens during the acute phase of infection.  Positive  results are indicative of active infection with SARS-CoV-2.  Clinical  correlation with patient history and other diagnostic information is  necessary to determine patient infection status.  Positive results do  not rule out bacterial infection or co-infection with other viruses. If  result is PRESUMPTIVE POSTIVE SARS-CoV-2 nucleic acids MAY BE PRESENT.   A presumptive positive result was obtained on the submitted specimen  and confirmed on repeat testing.  While 2019 novel coronavirus  (SARS-CoV-2) nucleic acids may be present in the submitted sample  additional confirmatory testing may be necessary for epidemiological  and / or clinical management purposes  to differentiate between  SARS-CoV-2 and other Sarbecovirus currently known to infect humans.  If clinically indicated additional testing with an alternate test  methodology [phone]) is a dvised. The SARS-CoV-2 RNA is generally  detectable in upper and lower respiratory specimens during the acute  phase of infection. The expected result is Negative. Fact Sheet for Patients:  StrictlyIdeas.no Fact Sheet for Healthcare Providers: BankingDealers.co.za This test is not yet approved or cleared by the [place] FDA and has been authorized for detection and/or diagnosis of SARS-CoV-2 by FDA under an Emergency Use Authorization (EUA).  This EUA will remain in effect (meaning this test can be used) for the duration of the COVID-19 declaration under Section 564(b)(1) of the Act, 21 U.S.C. section 360bbb-3(b)(1), unless the authorization is terminated or revoked sooner. Performed at [facility], [place] [address], [employer] [place] [employer], [place] [address]   Blood Culture (routine x 2)     Status: None   Collection Time: 10/09/18  6:09 PM   Specimen: BLOOD  Result Value Ref Range Status   Specimen Description BLOOD RIGHT ANTECUBITAL  Final   Special Requests   Final    BOTTLES DRAWN AEROBIC AND ANAEROBIC Blood Culture adequate volume   Culture   Final    NO GROWTH 5 DAYS Performed at [facility], [place] [address] [place], [place] [address]    Report Status 10/14/2018 FINAL  Final  Culture, respiratory (non-expectorated)     Status: None   Collection Time: 10/10/18   9:04 AM   Specimen: Tracheal Aspirate; Respiratory  Result Value Ref Range Status   Specimen Description   Final  TRACHEAL ASPIRATE Performed at [facility], [address] [place], [place] [address]    Special Requests   Final    Normal Performed at [facility], [address] [place], [place] [address]    Gram Stain NO WBC SEEN NO ORGANISMS SEEN   Final   Culture   Final    RARE Consistent with normal respiratory flora. Performed at [facility], [address] [place], [place] [address]    Report Status 10/12/2018 FINAL  Final  MRSA PCR Screening     Status: None   Collection Time: 10/11/18 12:31 PM   Specimen: Nasal Mucosa; Nasopharyngeal  Result Value Ref Range Status   MRSA by PCR NEGATIVE NEGATIVE Final    Comment:        The GeneXpert MRSA Assay (FDA approved for NASAL specimens only), is one component of a comprehensive MRSA colonization surveillance program. It is not intended to diagnose MRSA infection nor to guide or monitor treatment for MRSA infections. Performed at [facility], [address] [place], [place] [address]       Radiology Studies: No results found.  Scheduled Meds:  chlorhexidine  15 mL Mouth Rinse BID   Chlorhexidine Gluconate Cloth  6 each Topical Q0600   enoxaparin (LOVENOX) injection  75 mg Subcutaneous Q12H   feeding supplement (ENSURE ENLIVE)  237 mL Oral BID BM   insulin aspart  0-15 Units Subcutaneous TID AC & HS   insulin detemir  30 Units Subcutaneous Q12H   mouth rinse  15 mL Mouth Rinse q12n4p   nutrition supplement (JUVEN)  1 packet Per Tube BID BM   oxyCODONE  5 mg Oral Q8H   pantoprazole sodium  40 mg Per Tube QHS   Continuous Infusions:  sodium chloride     dextrose     remdesivir 100 mg in NS 250 mL       LOS: 9 days   Time spent: 35 minutes.  Tyrone Nineyan B Sammuel Blick, MD [facility] [email or web address] Password [facility] 10/18/2018, 2:35  PM

## 2018-10-18 NOTE — Progress Notes (Signed)
Assisted patient in calling his wife. Patient's wife has no questions at this time.

## 2018-10-18 NOTE — Progress Notes (Signed)
Patient pulling at BiPAP and pointing at water cup. This RN removed bipap to allow for drink of water. RN informed patient bipap would be put back on. Patient stated he did not want to wear at this time. Patient on HFNC 6L satting high 90s. Will continue to closely monitor.

## 2018-10-18 NOTE — Progress Notes (Signed)
Nutrition Follow-up   RD working remotely.   DOCUMENTATION CODES:   Morbid obesity  INTERVENTION:   Ensure Enlive po BID, each supplement provides 350 kcal and 20 grams of protein  Recommend scheduled bowel regimen until pt has BM  NUTRITION DIAGNOSIS:   Inadequate oral intake related to acute illness as evidenced by NPO status.  Being as diet advanced, supplements  GOAL:   Patient will meet greater than or equal to 90% of their needs  Progressing  MONITOR:   TF tolerance, Vent status, Labs, Weight trends, Skin  REASON FOR ASSESSMENT:   Ventilator    ASSESSMENT:   48 yo male admitted with ARDS secondary to COVID-19 pneumonits requiring intubation, AKI.  PMH include HTN, DM, obesity  6/02 Intubated 6/03-6/07 Proned daily 6/04 TF initated 6/09 Extubated  Diet advanced to Carb Modified yesterday evening. Recorded po intake 50% at breakfast this AM  Weight down to 144.6 kg; net negative 5 L  Constipated; no BM in 5 days  Labs: reviewed Meds: ss novolog, levemir    Diet Order:   Diet Order            Diet Carb Modified Fluid consistency: Thin; Room service appropriate? Yes  Diet effective now              EDUCATION NEEDS:   Not appropriate for education at this time  Skin:  Skin Assessment: Skin Integrity Issues: Stage II: sacrum (present on admission), nose (new)  Last BM:  6/6  Height:   Ht Readings from Last 1 Encounters:  10/16/18 5\' 11"  (1.803 m)    Weight:   Wt Readings from Last 1 Encounters:  10/17/18 (!) 144.6 kg    Ideal Body Weight:  78 kg  BMI:  Body mass index is 44.45 kg/m.  Estimated Nutritional Needs:   Kcal:  2100-2300 kcals  Protein:  130-145 g  Fluid:  >/= 2 L  Cate Billy Rocco MS, RDN, LDN, CNSC [phone] Pager  [phone] Weekend/On-Call Pager

## 2018-10-19 DIAGNOSIS — J80 Acute respiratory distress syndrome: Secondary | ICD-10-CM | POA: Diagnosis not present

## 2018-10-19 DIAGNOSIS — U071 COVID-19: Secondary | ICD-10-CM | POA: Diagnosis not present

## 2018-10-19 DIAGNOSIS — E0865 Diabetes mellitus due to underlying condition with hyperglycemia: Secondary | ICD-10-CM | POA: Diagnosis not present

## 2018-10-19 LAB — GLUCOSE, CAPILLARY
Glucose-Capillary: 97 mg/dL (ref 70–99)
Glucose-Capillary: 110 mg/dL — ABNORMAL HIGH (ref 70–99)
Glucose-Capillary: 145 mg/dL — ABNORMAL HIGH (ref 70–99)
Glucose-Capillary: 123 mg/dL — ABNORMAL HIGH (ref 70–99)

## 2018-10-19 LAB — COMPREHENSIVE METABOLIC PANEL
ALT: 52 U/L — ABNORMAL HIGH (ref 0–44)
AST: 21 U/L (ref 15–41)
Albumin: 2.7 g/dL — ABNORMAL LOW (ref 3.5–5.0)
Alkaline Phosphatase: 53 U/L (ref 38–126)
Anion gap: 10 (ref 5–15)
BUN: 32 mg/dL — ABNORMAL HIGH (ref 6–20)
CO2: 32 mmol/L (ref 22–32)
Calcium: 8.3 mg/dL — ABNORMAL LOW (ref 8.9–10.3)
Chloride: 93 mmol/L — ABNORMAL LOW (ref 98–111)
Creatinine, Ser: 0.91 mg/dL (ref 0.61–1.24)
GFR calc Af Amer: >60 mL/min (ref >60)
GFR calc non Af Amer: >60 mL/min (ref >60)
Glucose, Bld: 107 mg/dL — ABNORMAL HIGH (ref 70–99)
Potassium: 3.5 mmol/L (ref 3.5–5.1)
Sodium: 135 mmol/L (ref 135–145)
Total Bilirubin: 0.8 mg/dL (ref 0.3–1.2)
Total Protein: 7 g/dL (ref 6.5–8.1)

## 2018-10-19 LAB — C-REACTIVE PROTEIN: CRP: 3 mg/dL — ABNORMAL HIGH (ref <1.0)

## 2018-10-19 LAB — CBC
HCT: 46 % (ref 39.0–52.0)
Hemoglobin: 14.5 g/dL (ref 13.0–17.0)
MCH: 29.1 pg (ref 26.0–34.0)
MCHC: 31.5 g/dL (ref 30.0–36.0)
MCV: 92.2 fL (ref 80.0–100.0)
Platelets: 209 10*3/uL (ref 150–400)
RBC: 4.99 MIL/uL (ref 4.22–5.81)
RDW: 12.5 % (ref 11.5–15.5)
WBC: 10.8 10*3/uL — ABNORMAL HIGH (ref 4.0–10.5)
nRBC: 0 % (ref 0.0–0.2)

## 2018-10-19 LAB — FERRITIN: Ferritin: 1495 ng/mL — ABNORMAL HIGH (ref 24–336)

## 2018-10-19 MED ORDER — LIDOCAINE VISCOUS HCL 2 % MT SOLN
15.0000 mL | OROMUCOSAL | Status: DC | PRN
  Administered 2018-10-19 – 2018-10-20 (×3): 15 mL via OROMUCOSAL
  Filled 2018-10-19 (×5): qty 15

## 2018-10-19 NOTE — Progress Notes (Signed)
RT NOTE:  Pt has been awake all night and refuses BIPAP.

## 2018-10-19 NOTE — Progress Notes (Signed)
Occupational Therapy Treatment Patient Details Name: Roy Andrews MRN: [name] DOB: 1970/06/01 Today's Date: 10/19/2018    History of present illness 48 y.o. M with obesity who presented with malaise, lethargy and weakness, progressive over several days. +COVID Intubated and septic shock; extubated 10/16/18   OT comments  Spanish interpreter Melisa [phone] used throughout session. Pt with excellent progress towards OT goals this session. Able to complete bathing at supervision level (sponge bath) and reviewed shower safety and energy conservation - Pt does have a built in shower chair in his walk-in shower. Pt was able to get dressed with set up and min guard for BLE donning socks. Pt was able to perform rear peri care in standing with supervision. He was mod I for transfers and supervision for short ambulation on RA where his Saturations remained >90 with one small dip down to 88% which was quickly corrected without rest break. HR and RR WFL. Pt will benefit from continued OT in the acute setting to focus on EC.   Follow Up Recommendations  No OT follow up;Supervision/Assistance - 24 hour    Equipment Recommendations  3 in 1 bedside commode(will require WIDE/BARIATRIC)    Recommendations for Other Services      Precautions / Restrictions Precautions Precautions: Fall Restrictions Weight Bearing Restrictions: No       Mobility Bed Mobility               General bed mobility comments: OOB in recliner at beginning and end of session  Transfers Overall transfer level: Modified independent Equipment used: None Transfers: Sit to/from Stand Sit to Stand: Modified independent (Device/Increase time)              Balance Overall balance assessment: Mild deficits observed, not formally tested                                         ADL either performed or assessed with clinical judgement   ADL Overall ADL's : Needs assistance/impaired      Grooming: Modified independent   Upper Body Bathing: Set up;Standing Upper Body Bathing Details (indicate cue type and reason): completing bathing upon entering room Lower Body Bathing: Set up;Sit to/from stand Lower Body Bathing Details (indicate cue type and reason): able to get feet and rear Upper Body Dressing : Set up;Sitting Upper Body Dressing Details (indicate cue type and reason): to don clean gown Lower Body Dressing: Min guard;Sit to/from stand Lower Body Dressing Details (indicate cue type and reason): able to don socks without assist Toilet Transfer: Supervision/safety;Ambulation Toilet Transfer Details (indicate cue type and reason): RA, no DME Toileting- Clothing Manipulation and Hygiene: Supervision/safety;Sit to/from stand Toileting - Clothing Manipulation Details (indicate cue type and reason): for front and rear peri care Tub/ Shower Transfer: Supervision/safety;Ambulation;Walk-in Copywriter, advertisingshower Tub/Shower Transfer Details (indicate cue type and reason): built in seat Functional mobility during ADLs: Supervision/safety       Vision       Perception     Praxis      Cognition Arousal/Alertness: Awake/alert Behavior During Therapy: WFL for tasks assessed/performed Overall Cognitive Status: Within Functional Limits for tasks assessed                                          Exercises  Shoulder Instructions       General Comments      Pertinent Vitals/ Pain       Pain Assessment: No/denies pain Faces Pain Scale: Hurts even more Pain Location: ulcerations on tongue with certain PO intake Pain Descriptors / Indicators: Burning Pain Intervention(s): Monitored during session  Home Living                                          Prior Functioning/Environment              Frequency  Min 3X/week        Progress Toward Goals  OT Goals(current goals can now be found in the care plan section)  Progress towards  OT goals: Progressing toward goals  Acute Rehab OT Goals Patient Stated Goal: "to go home to see my kids" OT Goal Formulation: With patient Time For Goal Achievement: 10/31/18 Potential to Achieve Goals: Good  Plan Frequency remains appropriate;Discharge plan needs to be updated    Co-evaluation                 AM-PAC OT "6 Clicks" Daily Activity     Outcome Measure   Help from another person eating meals?: None Help from another person taking care of personal grooming?: None Help from another person toileting, which includes using toliet, bedpan, or urinal?: None Help from another person bathing (including washing, rinsing, drying)?: A Little Help from another person to put on and taking off regular upper body clothing?: A Little Help from another person to put on and taking off regular lower body clothing?: A Little 6 Click Score: 21    End of Session    OT Visit Diagnosis: Unsteadiness on feet (R26.81);Other abnormalities of gait and mobility (R26.89);Muscle weakness (generalized) (M62.81)   Activity Tolerance Patient tolerated treatment well   Patient Left in chair;with call bell/phone within reach;with nursing/sitter in room   Nurse Communication Mobility status        Time: 1545-1610 OT Time Calculation (min): 25 min  Charges: OT General Charges $OT Visit: 1 Visit OT Treatments $Self Care/Home Management : 23-37 mins  Hulda Humphrey OTR/L Acute Rehabilitation Services Pager: [phone] Office: [place] 10/19/2018, 6:09 PM

## 2018-10-19 NOTE — Progress Notes (Signed)
PROGRESS NOTE  Roy FusJuan Antonio Gold BarParedes Andrews  ZOX:[id number] DOB: 1970-08-02 DOA: 10/09/2018 PCP: Patient, No Pcp Per   Brief Narrative: Roy Andrews is a 48 y.o. male with a history of obesity and OSA who presented with progressive weakness. EMS reported SpO2 in the 30%'s on their arrival, improved with CPAP en route. CXR showed bilateral infiltrates and covid-19 testing was positive. The patient was intubated, given cefepime, steroids, and convalescent plasma. LFTs and creatinine were elevated on admission, so remdesivir was not given initially. He was extubated 6/9 and remdesivir is now started with improvement in LFTs and elevation in inflammatory markers.  Since then, patient has continued to improve.  Initially kept in ICU for nightly BiPAP.  Patient refusing BiPAP because of claustrophobic facemask.  He normally uses nasal high flow.  No complaints.   Assessment & Plan: Active Problems:   COVID-19   Acute respiratory failure with hypoxemia (HCC)   Acute respiratory distress syndrome (ARDS) due to COVID-19 virus   Pressure injury of skin  Septic shock and acute hypoxic and hypercarbic respiratory failure due to covid-19 pneumonia: - Continue airborne, contact precautions. PPE including surgical gown, gloves, face shield, cap, shoe covers, and N-95 used during this encounter in a negative pressure room.  - Check daily labs: CBC w/diff, CMP, d-dimer, fibrinogen, ferritin, LDH, CRP - Maintain euvolemia/net negative.  - Avoid NSAIDs - Continue prone/incentive spirometry. - Started remdesivir, planning 5 day course (6/10 - 6/14).   - Tracheal aspirate grew OPF, MRSA PCR negative, stop abx Patient since stabilized.  Transferred to medical floor.  Continue in hospital until 6/14 at which time, final dose of Remdisivir will be given and he can be discharged home if no further episodes  LFT elevation: Likely from hypoxic injury, now normalized - Hepatitis serology, HIV, and INR  all negative/normal.   Troponin elevation: Mild, flat trend  AKI: Now resolved, with IV fluids and p.o. intake - Avoid nephrotoxins  Hypernatremia: Now resolved - Allow to drink water ad lib, monitor.  Morbid obesity: BMI 44.  - Optimize nutritional status  T2DM: HbA1c 10%.  - Continue insulin.  CBGs remained stable.  He is starting to eat more  Stage II sacral pressure injury POA: This is documented previously.  Mavik he has since stabilized, will increase ambulation  DVT prophylaxis: Lovenox Code Status: Full Family Communication: Left message for family Disposition Plan: Transfer out of ICU.  Home on 6/14  Consultants:   PCCM  Procedures:   ETT 6/3 >  Arterial line 6/3 >  Antimicrobials:  Vancomycin 6/2 >> 6/3  Cefepime 6/2 >> 6/7   Objective: Vitals:   10/19/18 0845 10/19/18 0900 10/19/18 0915 10/19/18 1600  BP:  119/76    Pulse:  81 80   Resp: 19  (!) 28   Temp:    99.6 F (37.6 C)  TempSrc:    Axillary  SpO2: 92%  96%   Weight:      Height:        Intake/Output Summary (Last 24 hours) at 10/19/2018 1708 Last data filed at 10/19/2018 0400 Gross per 24 hour  Intake 710 ml  Output 725 ml  Net -15 ml   Filed Weights   10/15/18 0130 10/16/18 0500 10/17/18 0500  Weight: (!) 151 kg (!) 148 kg (!) 144.6 kg   Gen: 48 y.o. obese male in no distress HEENT: Normocephalic, atraumatic.  Neck is thick, narrow airway Pulm: Moderate inspiratory effort, otherwise clear to auscultation bilaterally CV: Regular  rate and rhythm.  GI: Abdomen soft, non-tender, non-distended, with normoactive bowel sounds.  Ext: No clubbing or cyanosis, trace pitting edema Skin: No skin breaks, tears or lesions Neuro: No focal deficits Psych: Appropriate, no evidence of psychoses  Data Reviewed: I have personally reviewed following labs and imaging studies  CBC: Recent Labs  Lab 10/15/18 0205 10/16/18 0423 10/17/18 0205 10/18/18 0610 10/19/18 0530  WBC 11.2* 11.5*  13.8* 12.4* 10.8*  HGB 14.6 15.1 15.8 14.5 14.5  HCT 48.3 53.8* 52.7* 46.5 46.0  MCV 98.0 102.5* 99.4 96.3 92.2  PLT 275 262 253 220 209   Basic Metabolic Panel: Recent Labs  Lab 10/12/18 1815  10/15/18 0205 10/16/18 0423 10/17/18 0205 10/18/18 0610 10/19/18 0530  NA  --    < > 147* 151* 155* 139 135  K  --    < > 4.3 4.7 3.8 3.8 3.5  CL  --    < > 109 108 109 99 93*  CO2  --    < > 30 37* 35* 30 32  GLUCOSE  --    < > 297* 203* 108* 260* 107*  BUN  --    < > 84* 62* 47* 45* 32*  CREATININE  --    < > 1.41* 1.11 1.28* 1.02 0.91  CALCIUM  --    < > 8.8* 9.2 9.3 8.2* 8.3*  MG 3.0*  --   --   --   --   --   --   PHOS 3.8  --   --   --   --   --   --    < > = values in this interval not displayed.   GFR: Estimated Creatinine Clearance: 147.8 mL/min (by C-G formula based on SCr of 0.91 mg/dL). Liver Function Tests: Recent Labs  Lab 10/15/18 0205 10/16/18 0423 10/17/18 0205 10/18/18 0610 10/19/18 0530  AST 30 22 61* 30 21  ALT 147* 104* 91* 69* 52*  ALKPHOS 48 54 59 54 53  BILITOT 0.6 0.4 1.1 0.9 0.8  PROT 6.8 7.4 7.7 7.0 7.0  ALBUMIN 2.5* 2.7* 2.8* 2.6* 2.7*   No results for input(s): LIPASE, AMYLASE in the last 168 hours. No results for input(s): AMMONIA in the last 168 hours. Coagulation Profile: Recent Labs  Lab 10/13/18 0310 10/14/18 0400 10/15/18 0205  INR 1.2 1.2 1.2   Cardiac Enzymes: No results for input(s): CKTOTAL, CKMB, CKMBINDEX, TROPONINI in the last 168 hours. BNP (last 3 results) No results for input(s): PROBNP in the last 8760 hours. HbA1C: No results for input(s): HGBA1C in the last 72 hours. CBG: Recent Labs  Lab 10/18/18 1719 10/18/18 2114 10/19/18 0804 10/19/18 1205 10/19/18 1637  GLUCAP 170* 172* 97 110* 145*   Lipid Profile: No results for input(s): CHOL, HDL, LDLCALC, TRIG, CHOLHDL, LDLDIRECT in the last 72 hours. Thyroid Function Tests: No results for input(s): TSH, T4TOTAL, FREET4, T3FREE, THYROIDAB in the last 72  hours. Anemia Panel: Recent Labs    10/18/18 0610 10/19/18 0530  FERRITIN 2,068* 1,495*   Urine analysis: No results found for: COLORURINE, APPEARANCEUR, LABSPEC, PHURINE, GLUCOSEU, HGBUR, BILIRUBINUR, KETONESUR, PROTEINUR, UROBILINOGEN, NITRITE, LEUKOCYTESUR Recent Results (from the past 240 hour(s))  SARS Coronavirus 2 (CEPHEID- Performed in [facility] hospital lab), Hosp Order     Status: Abnormal   Collection Time: 10/09/18  5:30 PM   Specimen: Nasopharyngeal Swab  Result Value Ref Range Status   SARS Coronavirus 2 POSITIVE (A) NEGATIVE Final    Comment:  RESULT CALLED TO, READ BACK BY AND VERIFIED WITH: R HARDY RN [phone] 10/09/18 A BROWNING (NOTE) If result is NEGATIVE SARS-CoV-2 target nucleic acids are NOT DETECTED. The SARS-CoV-2 RNA is generally detectable in upper and lower  respiratory specimens during the acute phase of infection. The lowest  concentration of SARS-CoV-2 viral copies this assay can detect is 250  copies / mL. A negative result does not preclude SARS-CoV-2 infection  and should not be used as the sole basis for treatment or other  patient management decisions.  A negative result may occur with  improper specimen collection / handling, submission of specimen other  than nasopharyngeal swab, presence of viral mutation(s) within the  areas targeted by this assay, and inadequate number of viral copies  (<250 copies / mL). A negative result must be combined with clinical  observations, patient history, and epidemiological information. If result is POSITIVE SARS-CoV-2 target nucleic acids are DETECTED. The  SARS-CoV-2 RNA is generally detectable in upper and lower  respiratory specimens during the acute phase of infection.  Positive  results are indicative of active infection with SARS-CoV-2.  Clinical  correlation with patient history and other diagnostic information is  necessary to determine patient infection status.  Positive results do  not rule out  bacterial infection or co-infection with other viruses. If result is PRESUMPTIVE POSTIVE SARS-CoV-2 nucleic acids MAY BE PRESENT.   A presumptive positive result was obtained on the submitted specimen  and confirmed on repeat testing.  While 2019 novel coronavirus  (SARS-CoV-2) nucleic acids may be present in the submitted sample  additional confirmatory testing may be necessary for epidemiological  and / or clinical management purposes  to differentiate between  SARS-CoV-2 and other Sarbecovirus currently known to infect humans.  If clinically indicated additional testing with an alternate test  methodology [phone]([id number]) is a dvised. The SARS-CoV-2 RNA is generally  detectable in upper and lower respiratory specimens during the acute  phase of infection. The expected result is Negative. Fact Sheet for Patients:  BoilerBrush.com.cyhttps://www.fda.gov/media/136312/download Fact Sheet for Healthcare Providers: https://pope.com/https://www.fda.gov/media/136313/download This test is not yet approved or cleared by the [place] FDA and has been authorized for detection and/or diagnosis of SARS-CoV-2 by FDA under an Emergency Use Authorization (EUA).  This EUA will remain in effect (meaning this test can be used) for the duration of the COVID-19 declaration under Section 564(b)(1) of the Act, 21 U.S.C. section 360bbb-3(b)(1), unless the authorization is terminated or revoked sooner. Performed at [facility], [address] [place], [place] [address]   Blood Culture (routine x 2)     Status: None   Collection Time: 10/09/18  6:09 PM   Specimen: BLOOD  Result Value Ref Range Status   Specimen Description BLOOD RIGHT ANTECUBITAL  Final   Special Requests   Final    BOTTLES DRAWN AEROBIC AND ANAEROBIC Blood Culture adequate volume   Culture   Final    NO GROWTH 5 DAYS Performed at [facility], [address] [place], [place] [address]    Report Status 10/14/2018 FINAL  Final  Culture, respiratory  (non-expectorated)     Status: None   Collection Time: 10/10/18  9:04 AM   Specimen: Tracheal Aspirate; Respiratory  Result Value Ref Range Status   Specimen Description   Final    TRACHEAL ASPIRATE Performed at [facility], [address] [place], [place] [address]    Special Requests   Final    Normal Performed at  [facility], [place] [address], [place], [place] [address]    Gram Stain NO WBC SEEN NO ORGANISMS SEEN   Final   Culture   Final    RARE Consistent with normal respiratory flora. Performed at [facility], [place] [address], [place], [place] [address]    Report Status 10/12/2018 FINAL  Final  MRSA PCR Screening     Status: None   Collection Time: 10/11/18 12:31 PM   Specimen: Nasal Mucosa; Nasopharyngeal  Result Value Ref Range Status   MRSA by PCR NEGATIVE NEGATIVE Final    Comment:        The GeneXpert MRSA Assay (FDA approved for NASAL specimens only), is one component of a comprehensive MRSA colonization surveillance program. It is not intended to diagnose MRSA infection nor to guide or monitor treatment for MRSA infections. Performed at [facility], [place] [address] [place], [place] [address]       Radiology Studies: No results found.  Scheduled Meds:  Chlorhexidine Gluconate Cloth  6 each Topical Q0600   enoxaparin (LOVENOX) injection  75 mg Subcutaneous Q12H   feeding supplement (ENSURE ENLIVE)  237 mL Oral BID BM   insulin aspart  0-15 Units Subcutaneous TID AC & HS   insulin detemir  30 Units Subcutaneous Q12H   nutrition supplement (JUVEN)  1 packet Per Tube BID BM   pantoprazole sodium  40 mg Per Tube QHS   Continuous Infusions:  sodium chloride     dextrose     remdesivir 100 mg in NS 250 mL 100 mg (10/18/18 1626)     LOS: 10 days    Annita Brod, MD [facility] [email or web address] Password [facility] 10/19/2018, 5:08 PM

## 2018-10-19 NOTE — Care Management (Signed)
Case manager will continue to monitor patient for appropriate disposition as he medically improves. May God Bless him to do so.    Ricki Miller, RN BSN Case Manager [phone]

## 2018-10-19 NOTE — Progress Notes (Signed)
Patient now going to sleep. He refuses BiPAP at this time. Patient on room air. Will continue to closely monitor.

## 2018-10-19 NOTE — Progress Notes (Signed)
Patient able to reach 2000 mL using the incentive spirometer.

## 2018-10-19 NOTE — Progress Notes (Signed)
  Speech Language Pathology Treatment: Dysphagia  Patient Details Name: Roy Andrews MRN: [id number] DOB: January 14, 1971 Today's Date: 10/19/2018 Time: 1219-7588 SLP Time Calculation (min) (ACUTE ONLY): 23 min  Assessment / Plan / Recommendation Clinical Impression  Pt was seen with use of [employer] interpreter, although he does communicate in some English as well. He voices no concerns about swallowing function, and neither does his RN. He does however show SLP the ulcerations that are present on his tongue, which cause him pain and "burning" while trying to eat and drink certain things. He says that harder consistencies are more challenging; acidic drinks as well. His oropharyngeal swallow appears functional otherwise, and he is cognitively able to distinguish between what he can and cannot try comfortably. Recommend continuing regular solids and thin liquids so that pt may choose what works for him. SLP to sign off acutely.    HPI HPI: 48 y.o. Spanish-speaking male with a history of obesity and OSA who presented with progressive weakness 10/09/18. EMS reported SpO2 in the 30%s on their arrival, improved with CPAP en route. CXR showed bilateral infiltrates and covid-19 testing was positive. The patient was intubated, given cefepime, steroids, and convalescent plasma. He was extubated 6/9.  Dx acute hypoxic and hypercarbic respiratory failure due to COVID-19 pna, septic shock.       SLP Plan  All goals met       Recommendations  Diet recommendations: Regular;Thin liquid Liquids provided via: Cup;Straw Medication Administration: Whole meds with liquid Supervision: Patient able to self feed;Intermittent supervision to cue for compensatory strategies Compensations: Slow rate;Small sips/bites Postural Changes and/or Swallow Maneuvers: Seated upright 90 degrees                Oral Care Recommendations: Oral care BID Follow up Recommendations: None SLP Visit Diagnosis: Dysphagia,  unspecified (R13.10) Plan: All goals met       GO                Venita Sheffield Neriah Brott 10/19/2018, 4:30 PM  Pollyann Glen, M.A. [place] Acute Environmental education officer [phone] Office [phone]

## 2018-10-20 DIAGNOSIS — E1169 Type 2 diabetes mellitus with other specified complication: Secondary | ICD-10-CM

## 2018-10-20 DIAGNOSIS — J988 Other specified respiratory disorders: Secondary | ICD-10-CM

## 2018-10-20 DIAGNOSIS — J9601 Acute respiratory failure with hypoxia: Secondary | ICD-10-CM

## 2018-10-20 DIAGNOSIS — U071 COVID-19: Secondary | ICD-10-CM | POA: Diagnosis not present

## 2018-10-20 LAB — COMPREHENSIVE METABOLIC PANEL
ALT: 53 U/L — ABNORMAL HIGH (ref 0–44)
AST: 41 U/L (ref 15–41)
Albumin: 2.7 g/dL — ABNORMAL LOW (ref 3.5–5.0)
Alkaline Phosphatase: 49 U/L (ref 38–126)
Anion gap: 7 (ref 5–15)
BUN: 23 mg/dL — ABNORMAL HIGH (ref 6–20)
CO2: 32 mmol/L (ref 22–32)
Calcium: 8.4 mg/dL — ABNORMAL LOW (ref 8.9–10.3)
Chloride: 99 mmol/L (ref 98–111)
Creatinine, Ser: 0.98 mg/dL (ref 0.61–1.24)
GFR calc Af Amer: >60 mL/min (ref >60)
GFR calc non Af Amer: >60 mL/min (ref >60)
Glucose, Bld: 84 mg/dL (ref 70–99)
Potassium: 3.7 mmol/L (ref 3.5–5.1)
Sodium: 138 mmol/L (ref 135–145)
Total Bilirubin: 0.9 mg/dL (ref 0.3–1.2)
Total Protein: 6.7 g/dL (ref 6.5–8.1)

## 2018-10-20 LAB — CBC
HCT: 43.4 % (ref 39.0–52.0)
Hemoglobin: 14.1 g/dL (ref 13.0–17.0)
MCH: 29.7 pg (ref 26.0–34.0)
MCHC: 32.5 g/dL (ref 30.0–36.0)
MCV: 91.6 fL (ref 80.0–100.0)
Platelets: 193 10*3/uL (ref 150–400)
RBC: 4.74 MIL/uL (ref 4.22–5.81)
RDW: 12.5 % (ref 11.5–15.5)
WBC: 9.5 10*3/uL (ref 4.0–10.5)
nRBC: 0 % (ref 0.0–0.2)

## 2018-10-20 LAB — GLUCOSE, CAPILLARY
Glucose-Capillary: 84 mg/dL (ref 70–99)
Glucose-Capillary: 104 mg/dL — ABNORMAL HIGH (ref 70–99)
Glucose-Capillary: 113 mg/dL — ABNORMAL HIGH (ref 70–99)
Glucose-Capillary: 106 mg/dL — ABNORMAL HIGH (ref 70–99)

## 2018-10-20 LAB — BRAIN NATRIURETIC PEPTIDE: B Natriuretic Peptide: 38.7 pg/mL (ref 0.0–100.0)

## 2018-10-20 MED ORDER — INSULIN ASPART PROT & ASPART (70-30 MIX) 100 UNIT/ML ~~LOC~~ SUSP
15.0000 [IU] | Freq: Two times a day (BID) | SUBCUTANEOUS | Status: DC
  Administered 2018-10-20 – 2018-10-22 (×4): 15 [IU] via SUBCUTANEOUS
  Filled 2018-10-20: qty 10

## 2018-10-20 MED ORDER — GUAIFENESIN ER 600 MG PO TB12
600.0000 mg | ORAL_TABLET | Freq: Two times a day (BID) | ORAL | Status: DC
  Administered 2018-10-20 – 2018-10-22 (×4): 600 mg via ORAL
  Filled 2018-10-20 (×4): qty 1

## 2018-10-20 NOTE — Progress Notes (Addendum)
Physical Therapy Treatment Patient Details Name: Roy Andrews MRN: [id number] DOB: [birth year] Today's Date: 10/20/2018    History of Present Illness 48 y.o. M with obesity who presented with malaise, lethargy and weakness, progressive over several days. +COVID Intubated and septic shock; extubated 10/16/18    PT Comments    Pt making good progress. Amb in hall on RA with SpO2 89%. SpO2 may drop with sleep. Currently will recommend HHPT but may progress past this quickly.    Follow Up Recommendations  Home health PT     Equipment Recommendations  None recommended by PT    Recommendations for Other Services       Precautions / Restrictions Precautions Precautions: Fall Restrictions Weight Bearing Restrictions: No    Mobility  Bed Mobility Overal bed mobility: Needs Assistance Bed Mobility: Supine to Sit     Supine to sit: Min assist     General bed mobility comments: assist to elevate trunk into sittin  Transfers Overall transfer level: Modified independent Equipment used: None Transfers: Sit to/from Stand Sit to Stand: Modified independent (Device/Increase time)            Ambulation/Gait Ambulation/Gait assistance: Supervision Gait Distance (Feet): 300 Feet Assistive device: None Gait Pattern/deviations: Step-through pattern;Decreased stride length Gait velocity: decr Gait velocity interpretation: 1.31 - 2.62 ft/sec, indicative of limited community ambulator General Gait Details: slightly unsteady but no loss of balance. Pt  amb on RA with SpO2 89%.    Stairs             Wheelchair Mobility    Modified Rankin (Stroke Patients Only)       Balance Overall balance assessment: Mild deficits observed, not formally tested                                          Cognition Arousal/Alertness: Awake/alert Behavior During Therapy: WFL for tasks assessed/performed Overall Cognitive Status: Within Functional Limits  for tasks assessed                                        Exercises      General Comments General comments (skin integrity, edema, etc.): attempted to use video translator [phone] [facility] but screen kept freezing. Tried a second time and again freezing.       Pertinent Vitals/Pain Pain Assessment: No/denies pain    Home Living                      Prior Function            PT Goals (current goals can now be found in the care plan section) Acute Rehab PT Goals Patient Stated Goal: go home PT Goal Formulation: With patient Time For Goal Achievement: 10/31/18 Potential to Achieve Goals: Good Progress towards PT goals: Goals met and updated - see care plan    Frequency    Min 3X/week      PT Plan Discharge plan needs to be updated    Co-evaluation              AM-PAC PT "6 Clicks" Mobility   Outcome Measure  Help needed turning from your back to your side while in a flat bed without using bedrails?: None Help needed moving from lying on your back to  sitting on the side of a flat bed without using bedrails?: A Little Help needed moving to and from a bed to a chair (including a wheelchair)?: A Little Help needed standing up from a chair using your arms (e.g., wheelchair or bedside chair)?: None Help needed to walk in hospital room?: A Little Help needed climbing 3-5 steps with a railing? : A Little 6 Click Score: 20    End of Session   Activity Tolerance: Patient tolerated treatment well Patient left: in chair   PT Visit Diagnosis: Unsteadiness on feet (R26.81);Muscle weakness (generalized) (M62.81)     Time: 4835-0757 PT Time Calculation (min) (ACUTE ONLY): 19 min  Charges:  $Gait Training: 8-22 mins                     [place] Pager [phone] Office [place] 10/20/2018, 1:14 PM

## 2018-10-20 NOTE — Progress Notes (Signed)
Inpatient Diabetes Program Recommendations  AACE/ADA: New Consensus Statement on Inpatient Glycemic Control (2015)  Target Ranges:  Prepandial:   less than 140 mg/dL      Peak postprandial:   less than 180 mg/dL (1-2 hours)      Critically ill patients:  140 - 180 mg/dL   Lab Results  Component Value Date   GLUCAP 113 (H) 10/20/2018   HGBA1C 10.0 (H) 10/11/2018    Review of Glycemic Control Results for Roy Andrews, Roy Andrews (MRN [id number]) as of 10/20/2018 16:27  Ref. Range 10/19/2018 19:44 10/20/2018 08:20 10/20/2018 11:23 10/20/2018 16:00  Glucose-Capillary Latest Ref Range: 70 - 99 mg/dL 123 (H) 84 104 (H) 113 (H)   Diabetes history: DM 2 Outpatient Diabetes medications: None Current orders for Inpatient glycemic control:  Novolog moderate tid with meals and HS Novolog 70/30 15 units bid  Inpatient Diabetes Program Recommendations:    Referral received.  Will ask bedside RN to teach insulin administration to patient. Agree with 70/30 due to cost (can be purchased for 25$ at [facility] per vial).  Will follow.   Thanks,  Adah Perl, RN, BC-ADM Inpatient Diabetes Coordinator Pager [phone] (8a-5p)

## 2018-10-20 NOTE — Progress Notes (Addendum)
PROGRESS NOTE                                                                                                                                                                                                             Patient Demographics:    Roy Andrews, is a 48 y.o. male, DOB - 1971/01/07, ZOX:[id number]  Outpatient Primary MD for the patient is Patient, No Pcp Per    LOS - 11  Chief Complaint  Patient presents with  . Shortness of Breath       Brief Narrative: Patient is a 48 y.o. male with PMHx of obesity, OSA, DM-presented with progressive weakness and shortness of breath, he was found to have acute hypoxic respiratory failure in the setting of COVID-19 pneumonia, he was intubated and admitted to the intensive care unit.  With supportive care-patient improved-and was extubated on 6/9.  He was transferred to my service on 6/13.  Significant events: 6/2>>6/9:ETT 6/2>> 6/10: L IJ CVL    Subjective:   Feels better-no cough-denies any shortness of breath.  No major events overnight.Voice is hoarse   Assessment  & Plan :    Acute Hypoxic Resp Failure due to Covid 19 Viral pneumonia: Improved-intubated on admission-extubated on 6/9.  Plan is to continue with Remdesivir for total of 5 days-attempt to titrate off oxygen today.  May need to assess for home O2 requirement prior to discharge.  COVID-19 Labs:  Recent Labs    10/18/18 0610 10/19/18 0530  FERRITIN 2,068* 1,495*  CRP 5.6* 3.0*    Lab Results  Component Value Date   SARSCOV2NAA POSITIVE (A) 10/09/2018     COVID-19 Medications: 6/10>> Remdesivir 6/3>> convalescent plasma  Mild transaminitis: Resolving-secondary to COVID-19  DM-2  (A1c 10.0): CBG stable-continue Levemir 30 units and SSI.Will need to initiate Insulin/Metformin on discharge (not on any antidiabetic medications prior to this hospital stay)  Addendum: Spoke with case  management-recommendations we will switch to a cheaper form of insulin-hence will switch to insulin 70/30 15 units twice daily.  OSA: Probably will require sleep study and CPAP trial in the outpatient setting (per spouse-has been intolerant to CPAP in the past).  Morbid Obesity  ABG:    Component Value Date/Time   PHART 7.466 (H) 10/14/2018 0554   PCO2ART 40.9 10/14/2018 0554   PO2ART 69.0 (L) 10/14/2018 [id number]  HCO3 29.4 (H) 10/14/2018 0554   TCO2 31 10/14/2018 0554   ACIDBASEDEF 1.0 10/11/2018 1134   O2SAT 94.0 10/14/2018 0554    Condition - Stable  Family Communication  :  Spouse updated over the phone  Code Status :  Full Code  Diet :  Diet Order            Diet Carb Modified Fluid consistency: Thin; Room service appropriate? Yes  Diet effective now               Disposition Plan  :  Remain inpatient  Consults  :  PCCM  Procedures  :   6/2>>6/9: Intubation 6/2>> 6/10: L IJ CVL   GI prophylaxis: PPI  DVT Prophylaxis  :  Lovenox  Lab Results  Component Value Date   PLT 193 10/20/2018    Inpatient Medications  Scheduled Meds: . Chlorhexidine Gluconate Cloth  6 each Topical Q0600  . enoxaparin (LOVENOX) injection  75 mg Subcutaneous Q12H  . feeding supplement (ENSURE ENLIVE)  237 mL Oral BID BM  . insulin aspart  0-15 Units Subcutaneous TID AC & HS  . insulin detemir  30 Units Subcutaneous Q12H  . nutrition supplement (JUVEN)  1 packet Per Tube BID BM  . pantoprazole sodium  40 mg Per Tube QHS   Continuous Infusions: . sodium chloride    . remdesivir 100 mg in NS 250 mL 100 mg (10/19/18 1849)   PRN Meds:.Place/Maintain arterial line **AND** sodium chloride, acetaminophen (TYLENOL) oral liquid 160 mg/5 mL, haloperidol lactate, hydrALAZINE, lidocaine, metoprolol tartrate, midazolam  Antibiotics  :    Anti-infectives (From admission, onward)   Start     Dose/Rate Route Frequency Ordered Stop   10/18/18 1600  remdesivir 100 mg in sodium chloride 0.9  % 230 mL IVPB     100 mg 500 mL/hr over 30 Minutes Intravenous Every 24 hours 10/17/18 1403 10/22/18 1559   10/17/18 1600  remdesivir 200 mg in sodium chloride 0.9 % 210 mL IVPB     200 mg 500 mL/hr over 30 Minutes Intravenous Once 10/17/18 1403 10/17/18 1542   10/10/18 2000  vancomycin (VANCOCIN) 1,750 mg in sodium chloride 0.9 % 500 mL IVPB  Status:  Discontinued     1,750 mg 250 mL/hr over 120 Minutes Intravenous Every 24 hours 10/09/18 2218 10/11/18 1631   10/10/18 0200  ceFEPIme (MAXIPIME) 2 g in sodium chloride 0.9 % 100 mL IVPB     2 g 200 mL/hr over 30 Minutes Intravenous Every 8 hours 10/09/18 2218 10/13/18 1753   10/09/18 1745  ceFEPIme (MAXIPIME) 2 g in sodium chloride 0.9 % 100 mL IVPB     2 g 200 mL/hr over 30 Minutes Intravenous  Once 10/09/18 1740 10/09/18 1946   10/09/18 1745  vancomycin (VANCOCIN) 1,500 mg in sodium chloride 0.9 % 500 mL IVPB     1,500 mg 250 mL/hr over 120 Minutes Intravenous  Once 10/09/18 1740 10/09/18 2201       Time Spent in minutes  25   Jeoffrey MassedShanker Ghimire M.D on 10/20/2018 at 9:54 AM  To page go to [email or web address] - use universal password  [facility] -  Office  [phone]  Admit date - 10/09/2018    11    Objective:   Vitals:   10/20/18 0300 10/20/18 0400 10/20/18 0423 10/20/18 0500  BP:   (!) 100/57   Pulse: 76 82 78 81  Resp: (!) 25 (!) 31 18 (!) 0  Temp:   99  F (37.2 C)   TempSrc:   Oral   SpO2: (!) 83% (!) 88% 100% 98%  Weight:    (!) 152.3 kg  Height:        Wt Readings from Last 3 Encounters:  10/20/18 (!) 152.3 kg     Intake/Output Summary (Last 24 hours) at 10/20/2018 0954 Last data filed at 10/20/2018 0600 Gross per 24 hour  Intake 870 ml  Output 0 ml  Net 870 ml     Physical Exam Gen Exam:Alert awake-not in any distress HEENT:atraumatic, normocephalic Chest: B/L clear to auscultation anteriorly CVS:S1S2 regular Abdomen:soft non tender, non distended Extremities:no edema Neurology: Non focal  Skin: no rash   Data Review:    CBC Recent Labs  Lab 10/16/18 0423 10/17/18 0205 10/18/18 0610 10/19/18 0530 10/20/18 0400  WBC 11.5* 13.8* 12.4* 10.8* 9.5  HGB 15.1 15.8 14.5 14.5 14.1  HCT 53.8* 52.7* 46.5 46.0 43.4  PLT 262 253 220 209 193  MCV 102.5* 99.4 96.3 92.2 91.6  MCH 28.8 29.8 30.0 29.1 29.7  MCHC 28.1* 30.0 31.2 31.5 32.5  RDW 13.6 13.3 13.2 12.5 12.5    Chemistries  Recent Labs  Lab 10/16/18 0423 10/17/18 0205 10/18/18 0610 10/19/18 0530 10/20/18 0400  NA 151* 155* 139 135 138  K 4.7 3.8 3.8 3.5 3.7  CL 108 109 99 93* 99  CO2 37* 35* 30 32 32  GLUCOSE 203* 108* 260* 107* 84  BUN 62* 47* 45* 32* 23*  CREATININE 1.11 1.28* 1.02 0.91 0.98  CALCIUM 9.2 9.3 8.2* 8.3* 8.4*  AST 22 61* 30 21 41  ALT 104* 91* 69* 52* 53*  ALKPHOS 54 59 54 53 49  BILITOT 0.4 1.1 0.9 0.8 0.9   ------------------------------------------------------------------------------------------------------------------ No results for input(s): CHOL, HDL, LDLCALC, TRIG, CHOLHDL, LDLDIRECT in the last 72 hours.  Lab Results  Component Value Date   HGBA1C 10.0 (H) 10/11/2018   ------------------------------------------------------------------------------------------------------------------ No results for input(s): TSH, T4TOTAL, T3FREE, THYROIDAB in the last 72 hours.  Invalid input(s): FREET3 ------------------------------------------------------------------------------------------------------------------ Recent Labs    10/18/18 0610 10/19/18 0530  FERRITIN 2,068* 1,495*    Coagulation profile Recent Labs  Lab 10/14/18 0400 10/15/18 0205  INR 1.2 1.2    No results for input(s): DDIMER in the last 72 hours.  Cardiac Enzymes No results for input(s): CKMB, TROPONINI, MYOGLOBIN in the last 168 hours.  Invalid input(s): CK ------------------------------------------------------------------------------------------------------------------    Component Value Date/Time   BNP  38.7 10/20/2018 0400    Micro Results Recent Results (from the past 240 hour(s))  MRSA PCR Screening     Status: None   Collection Time: 10/11/18 12:31 PM   Specimen: Nasal Mucosa; Nasopharyngeal  Result Value Ref Range Status   MRSA by PCR NEGATIVE NEGATIVE Final    Comment:        The GeneXpert MRSA Assay (FDA approved for NASAL specimens only), is one component of a comprehensive MRSA colonization surveillance program. It is not intended to diagnose MRSA infection nor to guide or monitor treatment for MRSA infections. Performed at [facility], [address] [place], [place] [address]     Radiology Reports Dg Abd 1 View  Result Date: 10/10/2018 CLINICAL DATA:  OG tube. EXAM: ABDOMEN - 1 VIEW COMPARISON:  None. FINDINGS: The OG tube projects over the gastric body. The tip is pointed distally. The bowel gas pattern is nonspecific. IMPRESSION: OG tube projects over the gastric body. Electronically Signed   By: Beryle Quanthristopher  Green M.D.  On: 10/10/2018 03:29   Dg Chest Port 1 View  Result Date: 10/14/2018 CLINICAL DATA:  Acute respiratory failure. EXAM: PORTABLE CHEST 1 VIEW COMPARISON:  Chest radiograph 10/10/2018 FINDINGS: ET tube mid trachea. Enteric tube courses inferior to the diaphragm. Central venous catheter tip projects over the superior vena cava. Stable cardiomegaly. Slight interval improvement in overall diffuse bilateral heterogeneous opacities with more focal worsening retrocardiac consolidation. Small left pleural effusion. IMPRESSION: Worsening retrocardiac consolidation with small left pleural effusion. Otherwise improving diffuse bilateral heterogeneous opacities. Electronically Signed   By: Lovey Newcomer M.D.   On: 10/14/2018 06:04   Dg Chest Port 1 View  Result Date: 10/10/2018 CLINICAL DATA:  Pneumonia EXAM: PORTABLE CHEST 1 VIEW COMPARISON:  10/09/2018 FINDINGS: The endotracheal tube terminates above the carina by approximately 3.3 cm. The  left-sided central venous catheter is well positioned with the tip terminating near the SVC. That will it has is there is no evidence of a pneumothorax. Multifocal airspace opacities are again noted with some slight improved aeration in the upper lung fields. The heart size remains enlarged. The enteric tube extends below the left hemidiaphragm. IMPRESSION: 1. Well-positioned left-sided central venous catheter without evidence of a pneumothorax. Remaining lines and tubes as above. 2. Multifocal airspace opacities are again noted with some improved aeration in the upper lung zones. Electronically Signed   By: Constance Holster M.D.   On: 10/10/2018 03:28   Dg Chest Port 1 View  Result Date: 10/09/2018 CLINICAL DATA:  Respiratory failure. EXAM: PORTABLE CHEST 1 VIEW COMPARISON:  Chest x-ray 05/12/2017 FINDINGS: The endotracheal tube is approximately 4.6 cm above the carina. The heart is mildly enlarged in the mediastinal and hilar contours are prominent. Some of this is due to the AP projection and the supine position of the patient. Significant bilateral airspace process, likely diffuse pneumonia. No definite pleural effusions. The bony thorax is grossly intact. IMPRESSION: 1. The ET tube is 4.6 cm above the carina. 2. Significant bilateral airspace process, right slightly greater than left. Electronically Signed   By: Marijo Sanes M.D.   On: 10/09/2018 18:45

## 2018-10-20 NOTE — Progress Notes (Signed)
Through [place], patient expressed that I did not need to call his wife with an update on his condition.  He had multiple questions regarding his Levimir and Humalog.  He was not on insulin at home.  I explained the difference between the two.  I also explained that his A1C was elevated.  He will need Diabetes education before discharge if he goes home on insulin. Earleen Reaper RN

## 2018-10-21 DIAGNOSIS — U071 COVID-19: Secondary | ICD-10-CM | POA: Diagnosis not present

## 2018-10-21 DIAGNOSIS — J9601 Acute respiratory failure with hypoxia: Secondary | ICD-10-CM | POA: Diagnosis not present

## 2018-10-21 DIAGNOSIS — E1169 Type 2 diabetes mellitus with other specified complication: Secondary | ICD-10-CM | POA: Diagnosis not present

## 2018-10-21 DIAGNOSIS — J988 Other specified respiratory disorders: Secondary | ICD-10-CM | POA: Diagnosis not present

## 2018-10-21 LAB — COMPREHENSIVE METABOLIC PANEL
ALT: 62 U/L — ABNORMAL HIGH (ref 0–44)
AST: 39 U/L (ref 15–41)
Albumin: 3 g/dL — ABNORMAL LOW (ref 3.5–5.0)
Alkaline Phosphatase: 49 U/L (ref 38–126)
Anion gap: 7 (ref 5–15)
BUN: 19 mg/dL (ref 6–20)
CO2: 30 mmol/L (ref 22–32)
Calcium: 8.2 mg/dL — ABNORMAL LOW (ref 8.9–10.3)
Chloride: 101 mmol/L (ref 98–111)
Creatinine, Ser: 0.86 mg/dL (ref 0.61–1.24)
GFR calc Af Amer: >60 mL/min (ref >60)
GFR calc non Af Amer: >60 mL/min (ref >60)
Glucose, Bld: 72 mg/dL (ref 70–99)
Potassium: 3.2 mmol/L — ABNORMAL LOW (ref 3.5–5.1)
Sodium: 138 mmol/L (ref 135–145)
Total Bilirubin: 0.9 mg/dL (ref 0.3–1.2)
Total Protein: 6.7 g/dL (ref 6.5–8.1)

## 2018-10-21 LAB — GLUCOSE, CAPILLARY
Glucose-Capillary: 87 mg/dL (ref 70–99)
Glucose-Capillary: 99 mg/dL (ref 70–99)
Glucose-Capillary: 102 mg/dL — ABNORMAL HIGH (ref 70–99)
Glucose-Capillary: 100 mg/dL — ABNORMAL HIGH (ref 70–99)

## 2018-10-21 MED ORDER — MAGIC MOUTHWASH W/LIDOCAINE
10.0000 mL | Freq: Four times a day (QID) | ORAL | Status: DC | PRN
  Administered 2018-10-21: 10 mL via ORAL
  Filled 2018-10-21 (×2): qty 10

## 2018-10-21 MED ORDER — POTASSIUM CHLORIDE CRYS ER 20 MEQ PO TBCR
40.0000 meq | EXTENDED_RELEASE_TABLET | Freq: Once | ORAL | Status: AC
  Administered 2018-10-21: 40 meq via ORAL
  Filled 2018-10-21: qty 2

## 2018-10-21 MED ORDER — NYSTATIN 100000 UNIT/ML MT SUSP
5.0000 mL | Freq: Four times a day (QID) | OROMUCOSAL | Status: DC
  Administered 2018-10-21 – 2018-10-22 (×2): 500000 [IU] via ORAL
  Filled 2018-10-21 (×10): qty 5

## 2018-10-21 NOTE — Progress Notes (Signed)
Occupational Therapy Treatment Patient Details Name: Roy Andrews MRN: [id number] DOB: [birth year] Today's Date: 10/21/2018    History of present illness 48 y.o. M with obesity who presented with malaise, lethargy and weakness, progressive over several days. +COVID Intubated and septic shock; extubated 10/16/18   OT comments  Pt progressing towards OT goals, presents supine in bed pleasant and willing to engage in therapy session. Pt requesting to perform mobility into hallway during session; completing mobility at hallway level without AD and overall minguard assist. Pt on RA with activity with lowest O2 sat noted 87%, returning to >90% within approx 30 sec and seated/standing rest breaks. Further educated pt re: energy conservation techniques and activity progression after return home while completing ADL/iADL tasks. Pt with energy conservation handout in room and verbalizes understanding of information provided. Feel POC remains appropriate at this time. Will continue to follow while he remains in acute setting.   Follow Up Recommendations  No OT follow up;Supervision/Assistance - 24 hour    Equipment Recommendations  3 in 1 bedside commode(will require bariatric)          Precautions / Restrictions Precautions Precautions: Fall Restrictions Weight Bearing Restrictions: No       Mobility Bed Mobility Overal bed mobility: Needs Assistance Bed Mobility: Supine to Sit     Supine to sit: Supervision     General bed mobility comments: for general safety, HOB slightly elevated  Transfers Overall transfer level: Modified independent Equipment used: None Transfers: Sit to/from Stand Sit to Stand: Modified independent (Device/Increase time)              Balance Overall balance assessment: Mild deficits observed, not formally tested                          ADL either performed or assessed with clinical judgement   ADL Overall ADL's : Needs  assistance/impaired                              Functional mobility during ADLs: Min guard;Supervision/safety General ADL Comments: pt performing room and hallway mobility this session on RA; further reviewed and educated on activity progress and energy conservation techniques for ADL/iADL tasks after return home. pt with EC handout in room and reviewed with pt. pt also with questions about where to obtain pulse ox for home use - provided resources and educated on maintaining O2 sats >90% with activity                        Cognition Arousal/Alertness: Awake/alert Behavior During Therapy: WFL for tasks assessed/performed Overall Cognitive Status: Within Functional Limits for tasks assessed                           General Comments: attempted to use stratus and ipad freezing, pt with preference not to attempt further                     General Comments Pt on RA with lowest O2 sat noted 87% with hallway level mobility    Pertinent Vitals/ Pain       Pain Assessment: No/denies pain  Prior Functioning/Environment              Frequency  Min 3X/week        Progress Toward Goals  OT Goals(current goals can now be found in the care plan section)  Progress towards OT goals: Progressing toward goals  Acute Rehab OT Goals Patient Stated Goal: go home OT Goal Formulation: With patient Time For Goal Achievement: 10/31/18 Potential to Achieve Goals: Good ADL Goals Pt Will Perform Grooming: with min guard assist;standing Pt Will Perform Upper Body Dressing: with set-up;with supervision;sitting Pt Will Perform Lower Body Dressing: with min guard assist;sit to/from stand Pt Will Transfer to Toilet: with min guard assist;ambulating;regular height toilet Pt Will Perform Toileting - Clothing Manipulation and hygiene: with min guard assist;sitting/lateral leans;sit to/from stand   Plan Discharge plan remains appropriate    Co-evaluation                 AM-PAC OT "6 Clicks" Daily Activity     Outcome Measure   Help from another person eating meals?: None Help from another person taking care of personal grooming?: None Help from another person toileting, which includes using toliet, bedpan, or urinal?: None Help from another person bathing (including washing, rinsing, drying)?: A Little Help from another person to put on and taking off regular upper body clothing?: A Little Help from another person to put on and taking off regular lower body clothing?: A Little 6 Click Score: 21    End of Session    OT Visit Diagnosis: Unsteadiness on feet (R26.81);Other abnormalities of gait and mobility (R26.89);Muscle weakness (generalized) (M62.81)   Activity Tolerance Patient tolerated treatment well   Patient Left in bed;with call bell/phone within reach   Nurse Communication Mobility status        Time: [id number] OT Time Calculation (min): 26 min  Charges: OT General Charges $OT Visit: 1 Visit OT Treatments $Self Care/Home Management : 8-22 mins $Therapeutic Activity: 8-22 mins  Marcy SirenBreanna Issa Luster, OT [facility] Pager [phone] Office [phone]   Orlando PennerBreanna L Felicha Frayne 10/21/2018, 4:14 PM

## 2018-10-21 NOTE — Progress Notes (Signed)
Extensive teaching given insulin administration. Patient instructed how to draw-up and administer insulin. Sq sites taught/ patient verbalized understanding. RN encouraged patient to voice and additional questions he may have in regards to diease and management

## 2018-10-21 NOTE — Progress Notes (Signed)
Spoke with pts wife on phone. Updates her on pt. Informed her the current plan is for him to go home tomorrow, and that we will call her tomorrow with an update on a timeframe. No further questions or concerns at this time.   Rufina Falco, RN BSN 10/21/2018 3:53 PM

## 2018-10-21 NOTE — Progress Notes (Signed)
PROGRESS NOTE                                                                                                                                                                                                             Patient Demographics:    Roy Andrews, is a 48 y.o. male, DOB - 01-Jul-1970, WUJ:[id number]  Outpatient Primary MD for the patient is Patient, No Pcp Per    LOS - 12  Chief Complaint  Patient presents with   Shortness of Breath       Brief Narrative: Patient is a 48 y.o. male with PMHx of obesity, OSA, DM-presented with progressive weakness and shortness of breath, he was found to have acute hypoxic respiratory failure in the setting of COVID-19 pneumonia, he was intubated and admitted to the intensive care unit.  With supportive care-patient improved-and was extubated on 6/9.  He was transferred to my service on 6/13.  Significant events: 6/2>>6/9:ETT 6/2>> 6/10: L IJ CVL    Subjective:   Anxious to go home-hoarseness of voice is better.  Complains of some sores in his oral cavity.   Assessment  & Plan :    Acute Hypoxic Resp Failure due to Covid 19 Viral pneumonia: Improved-on room air this morning.  Last day of Remdesivir is today.  Continue supportive care-assess for home O2 requirement prior to discharge.  Suspect if clinical improvement continues-tentatively will discharge on 6/15.    COVID-19 Labs:  Recent Labs    10/19/18 0530  FERRITIN 1,495*  CRP 3.0*    Lab Results  Component Value Date   SARSCOV2NAA POSITIVE (A) 10/09/2018     COVID-19 Medications: 6/10>> Remdesivir 6/3>> convalescent plasma  Mild transaminitis: Improving-secondary to COVID-19  DM-2  (A1c 10.0): CBGs stable-continue insulin 70/30 15 units twice daily.  Will resume metformin on discharge.  Insulin teaching-diabetic education in progress per nursing staff.   OSA: Probably will require sleep study and  CPAP trial in the outpatient setting (per spouse-has been intolerant to CPAP in the past).  Morbid Obesity  ABG:    Component Value Date/Time   PHART 7.466 (H) 10/14/2018 0554   PCO2ART 40.9 10/14/2018 0554   PO2ART 69.0 (L) 10/14/2018 0554   HCO3 29.4 (H) 10/14/2018 0554   TCO2 31 10/14/2018 0554   ACIDBASEDEF 1.0 10/11/2018 1134   O2SAT 94.0  10/14/2018 0554    Condition - Stable  Family Communication  :  Spouse updated over the phone on 6/13-we will update again tomorrow prior to discharge.  Code Status :  Full Code  Diet :  Diet Order            Diet Carb Modified Fluid consistency: Thin; Room service appropriate? Yes  Diet effective now               Disposition Plan  :  Remain inpatient-tentative discharge for 6/15  Consults  :  PCCM  Procedures  :   6/2>>6/9: Intubation 6/2>> 6/10: L IJ CVL   GI prophylaxis: PPI  DVT Prophylaxis  :  Lovenox  Lab Results  Component Value Date   PLT 193 10/20/2018    Inpatient Medications  Scheduled Meds:  Chlorhexidine Gluconate Cloth  6 each Topical Q0600   enoxaparin (LOVENOX) injection  75 mg Subcutaneous Q12H   feeding supplement (ENSURE ENLIVE)  237 mL Oral BID BM   guaiFENesin  600 mg Oral BID   insulin aspart  0-15 Units Subcutaneous TID AC & HS   insulin aspart protamine- aspart  15 Units Subcutaneous BID WC   nutrition supplement (JUVEN)  1 packet Per Tube BID BM   nystatin  5 mL Oral QID   pantoprazole sodium  40 mg Per Tube QHS   Continuous Infusions:  remdesivir 100 mg in NS 250 mL 100 mg (10/20/18 1531)   PRN Meds:.acetaminophen (TYLENOL) oral liquid 160 mg/5 mL, haloperidol lactate, hydrALAZINE, magic mouthwash w/lidocaine  Antibiotics  :    Anti-infectives (From admission, onward)   Start     Dose/Rate Route Frequency Ordered Stop   10/18/18 1600  remdesivir 100 mg in sodium chloride 0.9 % 230 mL IVPB     100 mg 500 mL/hr over 30 Minutes Intravenous Every 24 hours 10/17/18 1403  10/22/18 1559   10/17/18 1600  remdesivir 200 mg in sodium chloride 0.9 % 210 mL IVPB     200 mg 500 mL/hr over 30 Minutes Intravenous Once 10/17/18 1403 10/17/18 1542   10/10/18 2000  vancomycin (VANCOCIN) 1,750 mg in sodium chloride 0.9 % 500 mL IVPB  Status:  Discontinued     1,750 mg 250 mL/hr over 120 Minutes Intravenous Every 24 hours 10/09/18 2218 10/11/18 1631   10/10/18 0200  ceFEPIme (MAXIPIME) 2 g in sodium chloride 0.9 % 100 mL IVPB     2 g 200 mL/hr over 30 Minutes Intravenous Every 8 hours 10/09/18 2218 10/13/18 1753   10/09/18 1745  ceFEPIme (MAXIPIME) 2 g in sodium chloride 0.9 % 100 mL IVPB     2 g 200 mL/hr over 30 Minutes Intravenous  Once 10/09/18 1740 10/09/18 1946   10/09/18 1745  vancomycin (VANCOCIN) 1,500 mg in sodium chloride 0.9 % 500 mL IVPB     1,500 mg 250 mL/hr over 120 Minutes Intravenous  Once 10/09/18 1740 10/09/18 2201       Time Spent in minutes  25   Jeoffrey MassedShanker Essie Gehret M.D on 10/21/2018 at 10:54 AM  To page go to [email or web address] - use universal password  [facility] -  Office  [phone]  Admit date - 10/09/2018    12    Objective:   Vitals:   10/20/18 1947 10/20/18 2357 10/21/18 0325 10/21/18 0740  BP: 118/68 113/69 118/76 (!) 106/59  Pulse: 82 85 86 85  Resp: (!) 27 (!) 23 (!) 29 (!) 31  Temp: 99.3 F (37.4 C) 99  F (37.2 C) 98.4 F (36.9 C) 98.5 F (36.9 C)  TempSrc: Oral Oral Oral   SpO2: 95% 96% 94% 96%  Weight:   (!) 151.8 kg   Height:        Wt Readings from Last 3 Encounters:  10/21/18 (!) 151.8 kg     Intake/Output Summary (Last 24 hours) at 10/21/2018 1054 Last data filed at 10/20/2018 1330 Gross per 24 hour  Intake 0 ml  Output --  Net 0 ml     Physical Exam General appearance:Awake, alert, not in any distress.  Eyes:no scleral icterus. HEENT: Atraumatic and Normocephalic Neck: supple, no JVD. Resp:Good air entry bilaterally,no rales or rhonchi CVS: S1 S2 regular, no murmurs.  GI: Bowel sounds  present, Non tender and not distended with no gaurding, rigidity or rebound. Extremities: B/L Lower Ext shows no edema, both legs are warm to touch Neurology:  Non focal Psychiatric: Normal judgment and insight. Normal mood. Musculoskeletal:No digital cyanosis Skin:No Rash, warm and dry Wounds:N/A   Data Review:    CBC Recent Labs  Lab 10/16/18 0423 10/17/18 0205 10/18/18 0610 10/19/18 0530 10/20/18 0400  WBC 11.5* 13.8* 12.4* 10.8* 9.5  HGB 15.1 15.8 14.5 14.5 14.1  HCT 53.8* 52.7* 46.5 46.0 43.4  PLT 262 253 220 209 193  MCV 102.5* 99.4 96.3 92.2 91.6  MCH 28.8 29.8 30.0 29.1 29.7  MCHC 28.1* 30.0 31.2 31.5 32.5  RDW 13.6 13.3 13.2 12.5 12.5    Chemistries  Recent Labs  Lab 10/17/18 0205 10/18/18 0610 10/19/18 0530 10/20/18 0400 10/21/18 0445  NA 155* 139 135 138 138  K 3.8 3.8 3.5 3.7 3.2*  CL 109 99 93* 99 101  CO2 35* 30 32 32 30  GLUCOSE 108* 260* 107* 84 72  BUN 47* 45* 32* 23* 19  CREATININE 1.28* 1.02 0.91 0.98 0.86  CALCIUM 9.3 8.2* 8.3* 8.4* 8.2*  AST 61* 30 21 41 39  ALT 91* 69* 52* 53* 62*  ALKPHOS 59 54 53 49 49  BILITOT 1.1 0.9 0.8 0.9 0.9   ------------------------------------------------------------------------------------------------------------------ No results for input(s): CHOL, HDL, LDLCALC, TRIG, CHOLHDL, LDLDIRECT in the last 72 hours.  Lab Results  Component Value Date   HGBA1C 10.0 (H) 10/11/2018   ------------------------------------------------------------------------------------------------------------------ No results for input(s): TSH, T4TOTAL, T3FREE, THYROIDAB in the last 72 hours.  Invalid input(s): FREET3 ------------------------------------------------------------------------------------------------------------------ Recent Labs    10/19/18 0530  FERRITIN 1,495*    Coagulation profile Recent Labs  Lab 10/15/18 0205  INR 1.2    No results for input(s): DDIMER in the last 72 hours.  Cardiac Enzymes No  results for input(s): CKMB, TROPONINI, MYOGLOBIN in the last 168 hours.  Invalid input(s): CK ------------------------------------------------------------------------------------------------------------------    Component Value Date/Time   BNP 38.7 10/20/2018 0400    Micro Results Recent Results (from the past 240 hour(s))  MRSA PCR Screening     Status: None   Collection Time: 10/11/18 12:31 PM   Specimen: Nasal Mucosa; Nasopharyngeal  Result Value Ref Range Status   MRSA by PCR NEGATIVE NEGATIVE Final    Comment:        The GeneXpert MRSA Assay (FDA approved for NASAL specimens only), is one component of a comprehensive MRSA colonization surveillance program. It is not intended to diagnose MRSA infection nor to guide or monitor treatment for MRSA infections. Performed at [facility], [address] [place], [place] [address]     Radiology Reports Dg Abd 1 View  Result Date: 10/10/2018 CLINICAL  DATA:  OG tube. EXAM: ABDOMEN - 1 VIEW COMPARISON:  None. FINDINGS: The OG tube projects over the gastric body. The tip is pointed distally. The bowel gas pattern is nonspecific. IMPRESSION: OG tube projects over the gastric body. Electronically Signed   By: Constance Holster M.D.   On: 10/10/2018 03:29   Dg Chest Port 1 View  Result Date: 10/14/2018 CLINICAL DATA:  Acute respiratory failure. EXAM: PORTABLE CHEST 1 VIEW COMPARISON:  Chest radiograph 10/10/2018 FINDINGS: ET tube mid trachea. Enteric tube courses inferior to the diaphragm. Central venous catheter tip projects over the superior vena cava. Stable cardiomegaly. Slight interval improvement in overall diffuse bilateral heterogeneous opacities with more focal worsening retrocardiac consolidation. Small left pleural effusion. IMPRESSION: Worsening retrocardiac consolidation with small left pleural effusion. Otherwise improving diffuse bilateral heterogeneous opacities. Electronically Signed   By: Lovey Newcomer  M.D.   On: 10/14/2018 06:04   Dg Chest Port 1 View  Result Date: 10/10/2018 CLINICAL DATA:  Pneumonia EXAM: PORTABLE CHEST 1 VIEW COMPARISON:  10/09/2018 FINDINGS: The endotracheal tube terminates above the carina by approximately 3.3 cm. The left-sided central venous catheter is well positioned with the tip terminating near the SVC. That will it has is there is no evidence of a pneumothorax. Multifocal airspace opacities are again noted with some slight improved aeration in the upper lung fields. The heart size remains enlarged. The enteric tube extends below the left hemidiaphragm. IMPRESSION: 1. Well-positioned left-sided central venous catheter without evidence of a pneumothorax. Remaining lines and tubes as above. 2. Multifocal airspace opacities are again noted with some improved aeration in the upper lung zones. Electronically Signed   By: Constance Holster M.D.   On: 10/10/2018 03:28   Dg Chest Port 1 View  Result Date: 10/09/2018 CLINICAL DATA:  Respiratory failure. EXAM: PORTABLE CHEST 1 VIEW COMPARISON:  Chest x-ray 05/12/2017 FINDINGS: The endotracheal tube is approximately 4.6 cm above the carina. The heart is mildly enlarged in the mediastinal and hilar contours are prominent. Some of this is due to the AP projection and the supine position of the patient. Significant bilateral airspace process, likely diffuse pneumonia. No definite pleural effusions. The bony thorax is grossly intact. IMPRESSION: 1. The ET tube is 4.6 cm above the carina. 2. Significant bilateral airspace process, right slightly greater than left. Electronically Signed   By: Marijo Sanes M.D.   On: 10/09/2018 18:45

## 2018-10-21 NOTE — Progress Notes (Signed)
SATURATION QUALIFICATIONS: (This note is used to comply with regulatory documentation for home oxygen)  Patient Saturations on Room Air at Rest = 95%  Patient Saturations on Room Air while Ambulating = 87%  Patient Saturations on 2 Liters of oxygen while Ambulating = 93%  Please briefly explain why patient needs home oxygen: yes pt will need oygen at home. His sats drop while on room air during ambulation.

## 2018-10-21 NOTE — Progress Notes (Signed)
Pt demonstrated self administration of insulin injection. Will continue to practice with pt.   Rufina Falco, RN BSN 10/21/2018 10:00 AM

## 2018-10-21 NOTE — Progress Notes (Signed)
RN offered to call patients family for clinical update. Patient declined, states he has spoken with his family about his care. He did not agree to additional call at this time

## 2018-10-22 DIAGNOSIS — U071 COVID-19: Secondary | ICD-10-CM | POA: Diagnosis not present

## 2018-10-22 DIAGNOSIS — E1169 Type 2 diabetes mellitus with other specified complication: Secondary | ICD-10-CM

## 2018-10-22 DIAGNOSIS — J988 Other specified respiratory disorders: Secondary | ICD-10-CM | POA: Diagnosis not present

## 2018-10-22 DIAGNOSIS — J9601 Acute respiratory failure with hypoxia: Secondary | ICD-10-CM | POA: Diagnosis not present

## 2018-10-22 LAB — COMPREHENSIVE METABOLIC PANEL
ALT: 54 U/L — ABNORMAL HIGH (ref 0–44)
AST: 28 U/L (ref 15–41)
Albumin: 2.7 g/dL — ABNORMAL LOW (ref 3.5–5.0)
Alkaline Phosphatase: 45 U/L (ref 38–126)
Anion gap: 9 (ref 5–15)
BUN: 14 mg/dL (ref 6–20)
CO2: 26 mmol/L (ref 22–32)
Calcium: 8.1 mg/dL — ABNORMAL LOW (ref 8.9–10.3)
Chloride: 101 mmol/L (ref 98–111)
Creatinine, Ser: 0.81 mg/dL (ref 0.61–1.24)
GFR calc Af Amer: >60 mL/min (ref >60)
GFR calc non Af Amer: >60 mL/min (ref >60)
Glucose, Bld: 78 mg/dL (ref 70–99)
Potassium: 3.6 mmol/L (ref 3.5–5.1)
Sodium: 136 mmol/L (ref 135–145)
Total Bilirubin: 0.5 mg/dL (ref 0.3–1.2)
Total Protein: 6.5 g/dL (ref 6.5–8.1)

## 2018-10-22 LAB — GLUCOSE, CAPILLARY
Glucose-Capillary: 93 mg/dL (ref 70–99)
Glucose-Capillary: 89 mg/dL (ref 70–99)

## 2018-10-22 LAB — MAGNESIUM: Magnesium: 2.1 mg/dL (ref 1.7–2.4)

## 2018-10-22 MED ORDER — METFORMIN HCL 500 MG PO TABS: 500.0000 mg | ORAL_TABLET | Freq: Two times a day (BID) | ORAL | 2 refills | Status: DC

## 2018-10-22 MED ORDER — NYSTATIN 100000 UNIT/ML MT SUSP: 5.0000 mL | Freq: Four times a day (QID) | OROMUCOSAL | 0 refills | Status: AC

## 2018-10-22 MED ORDER — ENSURE ENLIVE PO LIQD: 237.0000 mL | Freq: Two times a day (BID) | ORAL | 12 refills | Status: DC

## 2018-10-22 MED ORDER — BLOOD GLUCOSE METER KIT: PACK | 0 refills | Status: AC

## 2018-10-22 MED ORDER — ASPIRIN EC 325 MG PO TBEC: 325.0000 mg | DELAYED_RELEASE_TABLET | Freq: Every day | ORAL | 0 refills | Status: AC

## 2018-10-22 MED ORDER — INSULIN ASPART PROT & ASPART (70-30 MIX) 100 UNIT/ML ~~LOC~~ SUSP: 15.0000 [IU] | Freq: Two times a day (BID) | SUBCUTANEOUS | 3 refills | Status: DC

## 2018-10-22 MED ORDER — ALBUTEROL SULFATE HFA 108 (90 BASE) MCG/ACT IN AERS: 2.0000 | INHALATION_SPRAY | Freq: Four times a day (QID) | RESPIRATORY_TRACT | 0 refills | Status: DC | PRN

## 2018-10-22 MED ORDER — BENZONATATE 100 MG PO CAPS: 100.0000 mg | ORAL_CAPSULE | Freq: Four times a day (QID) | ORAL | 0 refills | Status: AC | PRN

## 2018-10-22 MED FILL — FREESTYLE LITE TEST STRIP: 25 days supply | Qty: 100 | Fill #0

## 2018-10-22 MED FILL — NOVOLIN 70/30 100 UNITS/ML: (70-30) 100 | 30 days supply | Qty: 10 | Fill #0

## 2018-10-22 MED FILL — FREESTYLE LITE METER: 25 days supply | Qty: 1 | Fill #0

## 2018-10-22 MED FILL — ASPIRIN EC 325 MG TABLET: 325 | 14 days supply | Qty: 14 | Fill #0

## 2018-10-22 MED FILL — NYSTATIN 100,000 UNITS/ML S: 100000 | 4 days supply | Qty: 80 | Fill #0

## 2018-10-22 MED FILL — BENZONATATE 100 MG CAP: 100 | 8 days supply | Qty: 30 | Fill #0

## 2018-10-22 MED FILL — metFORMIN HCL 500 MG TABS: 500 | 30 days supply | Qty: 60 | Fill #0

## 2018-10-22 MED FILL — VENTOLIN HFA 90 MCG INHALER: 108 (90 BAS | 25 days supply | Qty: 18 | Fill #0

## 2018-10-22 MED FILL — FREESTYLE LANCETS: 25 days supply | Qty: 100 | Fill #0

## 2018-10-22 MED FILL — ULTICARE INS 0.3 ML 30GX1/2: 30G X 1/2" | 30 days supply | Qty: 60 | Fill #0

## 2018-10-22 NOTE — Discharge Instructions (Signed)
Person Under Monitoring Name: Roy Andrews  Location: [address] [place] [address]   Infection Prevention Recommendations for Individuals Confirmed to have, or Being Evaluated for, 2019 Novel Coronavirus (COVID-19) Infection Who Receive Care at Home  Individuals who are confirmed to have, or are being evaluated for, COVID-19 should follow the prevention steps below until a healthcare provider or local or state health department says they can return to normal activities.  Stay home except to get medical care You should restrict activities outside your home, except for getting medical care. Do not go to work, school, or public areas, and do not use public transportation or taxis.  Call ahead before visiting your doctor Before your medical appointment, call the healthcare provider and tell them that you have, or are being evaluated for, COVID-19 infection. This will help the healthcare providers office take steps to keep other people from getting infected. Ask your healthcare provider to call the local or state health department.  Monitor your symptoms Seek prompt medical attention if your illness is worsening (e.g., difficulty breathing). Before going to your medical appointment, call the healthcare provider and tell them that you have, or are being evaluated for, COVID-19 infection. Ask your healthcare provider to call the local or state health department.  Wear a facemask You should wear a facemask that covers your nose and mouth when you are in the same room with other people and when you visit a healthcare provider. People who live with or visit you should also wear a facemask while they are in the same room with you.  Separate yourself from other people in your home As much as possible, you should stay in a different room from other people in your home. Also, you should use a separate bathroom, if available.  Avoid sharing household items You  should not share dishes, drinking glasses, cups, eating utensils, towels, bedding, or other items with other people in your home. After using these items, you should wash them thoroughly with soap and water.  Cover your coughs and sneezes Cover your mouth and nose with a tissue when you cough or sneeze, or you can cough or sneeze into your sleeve. Throw used tissues in a lined trash can, and immediately wash your hands with soap and water for at least 20 seconds or use an alcohol-based hand rub.  Wash your [employer]hands Wash your hands often and thoroughly with soap and water for at least 20 seconds. You can use an alcohol-based hand sanitizer if soap and water are not available and if your hands are not visibly dirty. Avoid touching your eyes, nose, and mouth with unwashed hands.   Prevention Steps for Caregivers and Household Members of Individuals Confirmed to have, or Being Evaluated for, COVID-19 Infection Being Cared for in the Home  If you live with, or provide care at home for, a person confirmed to have, or being evaluated for, COVID-19 infection please follow these guidelines to prevent infection:  Follow healthcare providers instructions Make sure that you understand and can help the patient follow any healthcare provider instructions for all care.  Provide for the patients basic needs You should help the patient with basic needs in the home and provide support for getting groceries, prescriptions, and other personal needs.  Monitor the patients symptoms If they are getting sicker, call his or her medical provider and tell them that the patient has, or is being evaluated for, COVID-19 infection. This will help the healthcare providers  office take steps to keep other people from getting infected. Ask the healthcare provider to call the local or state health department.  Limit the number of people who have contact with the patient  If possible, have only one caregiver for the  patient.  Other household members should stay in another home or place of residence. If this is not possible, they should stay  in another room, or be separated from the patient as much as possible. Use a separate bathroom, if available.  Restrict visitors who do not have an essential need to be in the home.  Keep older adults, very young children, and other sick people away from the patient Keep older adults, very young children, and those who have compromised immune systems or chronic health conditions away from the patient. This includes people with chronic heart, lung, or kidney conditions, diabetes, and cancer.  Ensure good ventilation Make sure that shared spaces in the home have good air flow, such as from an air conditioner or an opened window, weather permitting.  Wash your hands often  Wash your hands often and thoroughly with soap and water for at least 20 seconds. You can use an alcohol based hand sanitizer if soap and water are not available and if your hands are not visibly dirty.  Avoid touching your eyes, nose, and mouth with unwashed hands.  Use disposable paper towels to dry your hands. If not available, use dedicated cloth towels and replace them when they become wet.  Wear a facemask and gloves  Wear a disposable facemask at all times in the room and gloves when you touch or have contact with the patients blood, body fluids, and/or secretions or excretions, such as sweat, saliva, sputum, nasal mucus, vomit, urine, or feces.  Ensure the mask fits over your nose and mouth tightly, and do not touch it during use.  Throw out disposable facemasks and gloves after using them. Do not reuse.  Wash your hands immediately after removing your facemask and gloves.  If your personal clothing becomes contaminated, carefully remove clothing and launder. Wash your hands after handling contaminated clothing.  Place all used disposable facemasks, gloves, and other waste in a lined  container before disposing them with other household waste.  Remove gloves and wash your hands immediately after handling these items.  Do not share dishes, glasses, or other household items with the patient  Avoid sharing household items. You should not share dishes, drinking glasses, cups, eating utensils, towels, bedding, or other items with a patient who is confirmed to have, or being evaluated for, COVID-19 infection.  After the person uses these items, you should wash them thoroughly with soap and water.  Wash laundry thoroughly  Immediately remove and wash clothes or bedding that have blood, body fluids, and/or secretions or excretions, such as sweat, saliva, sputum, nasal mucus, vomit, urine, or feces, on them.  Wear gloves when handling laundry from the patient.  Read and follow directions on labels of laundry or clothing items and detergent. In general, wash and dry with the warmest temperatures recommended on the label.  Clean all areas the individual has used often  Clean all touchable surfaces, such as counters, tabletops, doorknobs, bathroom fixtures, toilets, phones, keyboards, tablets, and bedside tables, every day. Also, clean any surfaces that may have blood, body fluids, and/or secretions or excretions on them.  Wear gloves when cleaning surfaces the patient has come in contact with.  Use a diluted bleach solution (e.g., dilute bleach with 1  part bleach and 10 parts water) or a household disinfectant with a label that says EPA-registered for coronaviruses. To make a bleach solution at home, add 1 tablespoon of bleach to 1 quart (4 cups) of water. For a larger supply, add  cup of bleach to 1 gallon (16 cups) of water.  Read labels of cleaning products and follow recommendations provided on product labels. Labels contain instructions for safe and effective use of the cleaning product including precautions you should take when applying the product, such as wearing gloves or  eye protection and making sure you have good ventilation during use of the product.  Remove gloves and wash hands immediately after cleaning.  Monitor yourself for signs and symptoms of illness Caregivers and household members are considered close contacts, should monitor their health, and will be asked to limit movement outside of the home to the extent possible. Follow the monitoring steps for close contacts listed on the symptom monitoring form.   ? If you have additional questions, contact your local health department or call the epidemiologist on call at [phone] (available 24/7). ? This guidance is subject to change. For the most up-to-date guidance from [facility], please refer to their website: YouBlogs.pl      Person Under Monitoring Name: [facility]  Location: [place] [address]   CORONAVIRUS DISEASE 2019 (COVID-19) Guidance for Persons Under Investigation You are being tested for the virus that causes coronavirus disease 2019 (COVID-19). Public health actions are necessary to ensure protection of your health and the health of others, and to prevent further spread of infection. COVID-19 is caused by a virus that can cause symptoms, such as fever, cough, and shortness of breath. The primary transmission from person to person is by coughing or sneezing. On June 07, 2018, the [place] announced a [employer] Emergency of International Concern and on June 08, 2018 the U.S. Department of Health and Human Services declared a public health emergency. If the virus that causesCOVID-19 spreads in the community, it could have severe public health consequences.  As a person under investigation for COVID-19, the [place] advises you to adhere to the following guidance until your test results are  reported to you. If your test result is positive, you will receive additional information from your provider and your local health department at that time.   Remain at home until you are cleared by your health provider or public health authorities.   Keep a log of visitors to your home using the form provided. Any visitors to your home must be aware of your isolation status.  If you plan to move to a new address or leave the county, notify the local health department in your county.  Call a doctor or seek care if you have an urgent medical need. Before seeking medical care, call ahead and get instructions from the provider before arriving at the medical office, clinic or hospital. Notify them that you are being tested for the virus that causes COVID-19 so arrangements can be made, as necessary, to prevent transmission to others in the healthcare setting. Next, notify the local health department in your county.  If a medical emergency arises and you need to call 911, inform the first responders that you are being tested for the virus that causes COVID-19. Next, notify the local health department in your county.  Adhere to all guidance set forth by the [place] for [place] of patients that is based on guidance from the Center for Disease Control and Prevention with suspected or confirmed COVID-19. It is provided with this guidance for Persons Under Investigation.  Your health and the health of our community are our top priorities. Public Health officials remain available to provide assistance and counseling to you about COVID-19 and compliance with this guidance.  Provider: ____________________________________________________________ Date: ______/_____/_________  By signing below, you acknowledge that you have read and agree to comply with this Guidance for Persons Under Investigation. ______________________________________________________________ Date:  ______/_____/_________  WHO DO I CALL? You can find a list of local health departments here: [email or web address] Health Department: ____________________________________________________________________ Contact Name: ________________________________________________________________________ Telephone: ___________________________________________________________________________  Marice Potter, [place], Communicable Disease Branch COVID-19 Guidance for Persons Under Investigation July 14, 2018

## 2018-10-22 NOTE — Progress Notes (Signed)
Discharge instructions given to pt and all questions were answered. Roy Andrews [phone] used.

## 2018-10-22 NOTE — Discharge Summary (Signed)
PATIENT DETAILS Name: Roy Andrews Age: 48 y.o. Sex: male Date of Birth: [birth year] MRN: [id number]. Admitting Physician: Marshell Garfinkel, MD [id number], No Pcp Per  Admit Date: 10/09/2018 Discharge date: 10/22/2018  Recommendations for Outpatient Follow-up:  1. Follow up with PCP in 1-2 weeks 2. Please obtain LFT's/BMP/CBC in one week  Admitted From:  Home  Disposition: [place]: No  Equipment/Devices: Oxygen 2L  Discharge Condition: Stable  CODE STATUS: FULL CODE  Diet recommendation:  Heart Healthy / Carb Modified  Brief Summary: Patient is a 48 y.o. male with PMHx of obesity, OSA, DM-presented with progressive weakness and shortness of breath, he was found to have acute hypoxic respiratory failure in the setting of COVID-19 pneumonia, he was intubated and admitted to the intensive care unit.  With supportive care-patient improved-and was extubated on 6/9.  He was transferred to my service on 6/13.  Significant events: 6/2>>6/9:ETT 6/2>>6/10: L IJ CVL   Brief Hospital Course: Acute Hypoxic Resp Failure due to Covid 19 Viral pneumonia: Improved-on room air this morning but desaturates with ambulation-probably will require home O2 which has been ordered.  Has completed 5-day course of Remdesivir, when he was in the ICU he also got convalescent plasma.  Since clinically improved-stable for discharge home today.  Case management consult ordered prior to discharge for home O2, follow-up appointments and medication assistance  Mild transaminitis: Improving-secondary to COVID-19  DM-2  (A1c 10.0): CBGs stable-continue insulin 70/30 15 units twice daily.    Start metformin on discharge.  Further optimization deferred to the outpatient setting  OSA: Probably will require sleep study and CPAP trial in the outpatient setting (per spouse-has been intolerant to CPAP in the past).  Morbid Obesity   Procedures/Studies: 6/2>>6/9:ETT  6/2>>6/10: L IJ CVL   Discharge Diagnoses:  Active Problems:   COVID-19   Acute respiratory failure with hypoxemia (HCC)   Acute respiratory distress syndrome (ARDS) due to COVID-19 virus   Pressure injury of skin   Discharge Instructions:  Activity:  As tolerated    Discharge Instructions    Diet - low sodium heart healthy   Complete by: As directed    Diet Carb Modified   Complete by: As directed    Discharge instructions   Complete by: As directed    Follow with Primary MD in 1 week  Please ask your primary care MD to refer you to pulmonology for a sleep study  Please get a complete blood count and chemistry panel checked by your Primary MD at your next visit, and again as instructed by your Primary MD.  Get Medicines reviewed and adjusted: Please take all your medications with you for your next visit with your Primary MD  Laboratory/radiological data: Please request your Primary MD to go over all hospital tests and procedure/radiological results at the follow up, please ask your Primary MD to get all Hospital records sent to his/her office.  In some cases, they will be blood work, cultures and biopsy results pending at the time of your discharge. Please request that your primary care M.D. follows up on these results.  Also Note the following: If you experience worsening of your admission symptoms, develop shortness of breath, life threatening emergency, suicidal or homicidal thoughts you must seek medical attention immediately by calling 911 or calling your MD immediately  if symptoms less severe.  You must read complete instructions/literature along with all the possible adverse reactions/side effects for all the Medicines you take and that  have been prescribed to you. Take any new Medicines after you have completely understood and accpet all the possible adverse reactions/side effects.   Do not drive when taking Pain medications or sleeping medications  (Benzodaizepines)  Do not take more than prescribed Pain, Sleep and Anxiety Medications. It is not advisable to combine anxiety,sleep and pain medications without talking with your primary care practitioner  Special Instructions: If you have smoked or chewed Tobacco  in the last 2 yrs please stop smoking, stop any regular Alcohol  and or any Recreational drug use.  Wear Seat belts while driving.  Please note: You were cared for by a hospitalist during your hospital stay. Once you are discharged, your primary care physician will handle any further medical issues. Please note that NO REFILLS for any discharge medications will be authorized once you are discharged, as it is imperative that you return to your primary care physician (or establish a relationship with a primary care physician if you do not have one) for your post hospital discharge needs so that they can reassess your need for medications and monitor your lab values.  ?   Person Under Monitoring Name: Zurich Carreno  Location: [place] [address]   Infection Prevention Recommendations for Individuals Confirmed to have, or Being Evaluated for, 2019 Novel Coronavirus (COVID-19) Infection Who Receive Care at Home  Individuals who are confirmed to have, or are being evaluated for, COVID-19 should follow the prevention steps below until a healthcare provider or local or state health department says they can return to normal activities.  Stay home except to get medical care You should restrict activities outside your home, except for getting medical care. Do not go to work, school, or public areas, and do not use public transportation or taxis.  Call ahead before visiting your doctor Before your medical appointment, call the healthcare provider and tell them that you have, or are being evaluated for, COVID-19 infection. This will help the healthcare provider's office take steps to keep other  people from getting infected. Ask your healthcare provider to call the local or state health department.  Monitor your symptoms Seek prompt medical attention if your illness is worsening (e.g., difficulty breathing). Before going to your medical appointment, call the healthcare provider and tell them that you have, or are being evaluated for, COVID-19 infection. Ask your healthcare provider to call the local or state health department.  Wear a facemask You should wear a facemask that covers your nose and mouth when you are in the same room with other people and when you visit a healthcare provider. People who live with or visit you should also wear a facemask while they are in the same room with you.  Separate yourself from other people in your home As much as possible, you should stay in a different room from other people in your home. Also, you should use a separate bathroom, if available.  Avoid sharing household items You should not share dishes, drinking glasses, cups, eating utensils, towels, bedding, or other items with other people in your home. After using these items, you should wash them thoroughly with soap and water.  Cover your coughs and sneezes Cover your mouth and nose with a tissue when you cough or sneeze, or you can cough or sneeze into your sleeve. Throw used tissues in a lined trash can, and immediately wash your hands with soap and water for at least 20 seconds or use an alcohol-based hand rub.  Wash your [facility] your hands often and thoroughly with soap and water for at least 20 seconds. You can use an alcohol-based hand sanitizer if soap and water are not available and if your hands are not visibly dirty. Avoid touching your eyes, nose, and mouth with unwashed hands.   Prevention Steps for Caregivers and Household Members of Individuals Confirmed to have, or Being Evaluated for, COVID-19 Infection Being Cared for in the Home  If you live with, or provide  care at home for, a person confirmed to have, or being evaluated for, COVID-19 infection please follow these guidelines to prevent infection:  Follow healthcare provider's instructions Make sure that you understand and can help the patient follow any healthcare provider instructions for all care.  Provide for the patient's basic needs You should help the patient with basic needs in the home and provide support for getting groceries, prescriptions, and other personal needs.  Monitor the patient's symptoms If they are getting sicker, call his or her medical provider and tell them that the patient has, or is being evaluated for, COVID-19 infection. This will help the healthcare provider's office take steps to keep other people from getting infected. Ask the healthcare provider to call the local or state health department.  Limit the number of people who have contact with the patient If possible, have only one caregiver for the patient. Other household members should stay in another home or place of residence. If this is not possible, they should stay in another room, or be separated from the patient as much as possible. Use a separate bathroom, if available. Restrict visitors who do not have an essential need to be in the home.  Keep older adults, very young children, and other sick people away from the patient Keep older adults, very young children, and those who have compromised immune systems or chronic health conditions away from the patient. This includes people with chronic heart, lung, or kidney conditions, diabetes, and cancer.  Ensure good ventilation Make sure that shared spaces in the home have good air flow, such as from an air conditioner or an opened window, weather permitting.  Wash your hands often Wash your hands often and thoroughly with soap and water for at least 20 seconds. You can use an alcohol based hand sanitizer if soap and water are not available and if your hands  are not visibly dirty. Avoid touching your eyes, nose, and mouth with unwashed hands. Use disposable paper towels to dry your hands. If not available, use dedicated cloth towels and replace them when they become wet.  Wear a facemask and gloves Wear a disposable facemask at all times in the room and gloves when you touch or have contact with the patient's blood, body fluids, and/or secretions or excretions, such as sweat, saliva, sputum, nasal mucus, vomit, urine, or feces.  Ensure the mask fits over your nose and mouth tightly, and do not touch it during use. Throw out disposable facemasks and gloves after using them. Do not reuse. Wash your hands immediately after removing your facemask and gloves. If your personal clothing becomes contaminated, carefully remove clothing and launder. Wash your hands after handling contaminated clothing. Place all used disposable facemasks, gloves, and other waste in a lined container before disposing them with other household waste. Remove gloves and wash your hands immediately after handling these items.  Do not share dishes, glasses, or other household items with the patient Avoid sharing household items. You should not share dishes, drinking  glasses, cups, eating utensils, towels, bedding, or other items with a patient who is confirmed to have, or being evaluated for, COVID-19 infection. After the person uses these items, you should wash them thoroughly with soap and water.  Wash laundry thoroughly Immediately remove and wash clothes or bedding that have blood, body fluids, and/or secretions or excretions, such as sweat, saliva, sputum, nasal mucus, vomit, urine, or feces, on them. Wear gloves when handling laundry from the patient. Read and follow directions on labels of laundry or clothing items and detergent. In general, wash and dry with the warmest temperatures recommended on the label.  Clean all areas the individual has used often Clean all  touchable surfaces, such as counters, tabletops, doorknobs, bathroom fixtures, toilets, phones, keyboards, tablets, and bedside tables, every day. Also, clean any surfaces that may have blood, body fluids, and/or secretions or excretions on them. Wear gloves when cleaning surfaces the patient has come in contact with. Use a diluted bleach solution (e.g., dilute bleach with 1 part bleach and 10 parts water) or a household disinfectant with a label that says EPA-registered for coronaviruses. To make a bleach solution at home, add 1 tablespoon of bleach to 1 quart (4 cups) of water. For a larger supply, add  cup of bleach to 1 gallon (16 cups) of water. Read labels of cleaning products and follow recommendations provided on product labels. Labels contain instructions for safe and effective use of the cleaning product including precautions you should take when applying the product, such as wearing gloves or eye protection and making sure you have good ventilation during use of the product. Remove gloves and wash hands immediately after cleaning.  Monitor yourself for signs and symptoms of illness Caregivers and household members are considered close contacts, should monitor their health, and will be asked to limit movement outside of the home to the extent possible. Follow the monitoring steps for close contacts listed on the symptom monitoring form.   ? If you have additional questions, contact your local health department or call the epidemiologist on call at [phone] (available 24/7). ? This guidance is subject to change. For the most up-to-date guidance from [facility], please refer to their website: [email or web address]   Increase activity slowly   Complete by: As directed      Allergies as of 10/22/2018   No Known Allergies     Medication List    STOP taking these medications   naproxen 500 MG tablet Commonly known as: NAPROSYN      TAKE these medications   albuterol 108 (90 Base) MCG/ACT inhaler Commonly known as: VENTOLIN HFA Inhale 2 puffs into the lungs every 6 (six) hours as needed for wheezing or shortness of breath.   aspirin EC 325 MG tablet Take 1 tablet (325 mg total) by mouth daily for 14 days.   benzonatate 100 MG capsule Commonly known as: Tessalon Perles Take 1 capsule (100 mg total) by mouth every 6 (six) hours as needed for cough.   blood glucose meter kit and supplies Dispense based on patient and insurance preference. Use up to four times daily as directed. (FOR ICD-10 E10.9, E11.9).   feeding supplement (ENSURE ENLIVE) Liqd Take 237 mLs by mouth 2 (two) times daily between meals.   insulin aspart protamine- aspart (70-30) 100 UNIT/ML injection Commonly known as: NOVOLOG MIX 70/30 Inject 0.15 mLs (15 Units total) into the skin 2 (two) times daily with a meal.   metFORMIN 500 MG tablet Commonly known as: Glucophage Take 1  tablet (500 mg total) by mouth 2 (two) times daily with a meal.   nystatin 100000 UNIT/ML suspension Commonly known as: MYCOSTATIN Take 5 mLs (500,000 Units total) by mouth 4 (four) times daily for 4 days.            Durable Medical Equipment  (From admission, onward)         Start     Ordered   10/22/18 0840  For home use only DME 3 n 1  Once     10/22/18 0839   10/22/18 0714  For home use only DME oxygen  Once    Question Answer Comment  Length of Need 6 Months   Mode or (Route) Nasal cannula   Liters per Minute 2   Frequency Continuous (stationary and portable oxygen unit needed)   Oxygen conserving device Yes   Oxygen delivery system Gas      10/22/18 0714          No Known Allergies  Consultations:   pulmonary/intensive care  Other Procedures/Studies: Dg Abd 1 View  Result Date: 10/10/2018 CLINICAL DATA:  OG tube. EXAM: ABDOMEN - 1 VIEW COMPARISON:  None. FINDINGS: The OG tube projects over the gastric body. The tip is pointed distally.  The bowel gas pattern is nonspecific. IMPRESSION: OG tube projects over the gastric body. Electronically Signed   By: Constance Holster M.D.   On: 10/10/2018 03:29   Dg Chest Port 1 View  Result Date: 10/14/2018 CLINICAL DATA:  Acute respiratory failure. EXAM: PORTABLE CHEST 1 VIEW COMPARISON:  Chest radiograph 10/10/2018 FINDINGS: ET tube mid trachea. Enteric tube courses inferior to the diaphragm. Central venous catheter tip projects over the superior vena cava. Stable cardiomegaly. Slight interval improvement in overall diffuse bilateral heterogeneous opacities with more focal worsening retrocardiac consolidation. Small left pleural effusion. IMPRESSION: Worsening retrocardiac consolidation with small left pleural effusion. Otherwise improving diffuse bilateral heterogeneous opacities. Electronically Signed   By: Lovey Newcomer M.D.   On: 10/14/2018 06:04   Dg Chest Port 1 View  Result Date: 10/10/2018 CLINICAL DATA:  Pneumonia EXAM: PORTABLE CHEST 1 VIEW COMPARISON:  10/09/2018 FINDINGS: The endotracheal tube terminates above the carina by approximately 3.3 cm. The left-sided central venous catheter is well positioned with the tip terminating near the SVC. That will it has is there is no evidence of a pneumothorax. Multifocal airspace opacities are again noted with some slight improved aeration in the upper lung fields. The heart size remains enlarged. The enteric tube extends below the left hemidiaphragm. IMPRESSION: 1. Well-positioned left-sided central venous catheter without evidence of a pneumothorax. Remaining lines and tubes as above. 2. Multifocal airspace opacities are again noted with some improved aeration in the upper lung zones. Electronically Signed   By: Constance Holster M.D.   On: 10/10/2018 03:28   Dg Chest Port 1 View  Result Date: 10/09/2018 CLINICAL DATA:  Respiratory failure. EXAM: PORTABLE CHEST 1 VIEW COMPARISON:  Chest x-ray 05/12/2017 FINDINGS: The endotracheal tube is  approximately 4.6 cm above the carina. The heart is mildly enlarged in the mediastinal and hilar contours are prominent. Some of this is due to the AP projection and the supine position of the patient. Significant bilateral airspace process, likely diffuse pneumonia. No definite pleural effusions. The bony thorax is grossly intact. IMPRESSION: 1. The ET tube is 4.6 cm above the carina. 2. Significant bilateral airspace process, right slightly greater than left. Electronically Signed   By: Marijo Sanes M.D.   On: 10/09/2018 18:45  TODAY-DAY OF DISCHARGE:  Subjective:   Norm Wray today has no headache,no chest abdominal pain,no new weakness tingling or numbness, feels much better wants to go home today.   Objective:   Blood pressure 132/66, pulse 80, temperature 98 F (36.7 C), temperature source Oral, resp. rate (!) 31, height _0  (1.803 m), weight (!) 149.9 kg, SpO2 97 %. No intake or output data in the 24 hours ending 10/22/18 0853 Filed Weights   10/21/18 0325 10/21/18 1617 10/22/18 0500  Weight: (!) 151.8 kg (!) 150.3 kg (!) 149.9 kg    Exam: Awake Alert, Oriented *3, No new F.N deficits, Normal affect [place].AT,PERRAL Supple Neck,No JVD, No cervical lymphadenopathy appriciated.  Symmetrical Chest wall movement, Good air movement bilaterally, CTAB RRR,No Gallops,Rubs or new Murmurs, No Parasternal Heave +ve B.Sounds, Abd Soft, Non tender, No organomegaly appriciated, No rebound -guarding or rigidity. No Cyanosis, Clubbing or edema, No new Rash or bruise   PERTINENT RADIOLOGIC STUDIES: Dg Abd 1 View  Result Date: 10/10/2018 CLINICAL DATA:  OG tube. EXAM: ABDOMEN - 1 VIEW COMPARISON:  None. FINDINGS: The OG tube projects over the gastric body. The tip is pointed distally. The bowel gas pattern is nonspecific. IMPRESSION: OG tube projects over the gastric body. Electronically Signed   By: Constance Holster M.D.   On: 10/10/2018 03:29   Dg Chest Port 1 View  Result  Date: 10/14/2018 CLINICAL DATA:  Acute respiratory failure. EXAM: PORTABLE CHEST 1 VIEW COMPARISON:  Chest radiograph 10/10/2018 FINDINGS: ET tube mid trachea. Enteric tube courses inferior to the diaphragm. Central venous catheter tip projects over the superior vena cava. Stable cardiomegaly. Slight interval improvement in overall diffuse bilateral heterogeneous opacities with more focal worsening retrocardiac consolidation. Small left pleural effusion. IMPRESSION: Worsening retrocardiac consolidation with small left pleural effusion. Otherwise improving diffuse bilateral heterogeneous opacities. Electronically Signed   By: Lovey Newcomer M.D.   On: 10/14/2018 06:04   Dg Chest Port 1 View  Result Date: 10/10/2018 CLINICAL DATA:  Pneumonia EXAM: PORTABLE CHEST 1 VIEW COMPARISON:  10/09/2018 FINDINGS: The endotracheal tube terminates above the carina by approximately 3.3 cm. The left-sided central venous catheter is well positioned with the tip terminating near the SVC. That will it has is there is no evidence of a pneumothorax. Multifocal airspace opacities are again noted with some slight improved aeration in the upper lung fields. The heart size remains enlarged. The enteric tube extends below the left hemidiaphragm. IMPRESSION: 1. Well-positioned left-sided central venous catheter without evidence of a pneumothorax. Remaining lines and tubes as above. 2. Multifocal airspace opacities are again noted with some improved aeration in the upper lung zones. Electronically Signed   By: Constance Holster M.D.   On: 10/10/2018 03:28   Dg Chest Port 1 View  Result Date: 10/09/2018 CLINICAL DATA:  Respiratory failure. EXAM: PORTABLE CHEST 1 VIEW COMPARISON:  Chest x-ray 05/12/2017 FINDINGS: The endotracheal tube is approximately 4.6 cm above the carina. The heart is mildly enlarged in the mediastinal and hilar contours are prominent. Some of this is due to the AP projection and the supine position of the patient.  Significant bilateral airspace process, likely diffuse pneumonia. No definite pleural effusions. The bony thorax is grossly intact. IMPRESSION: 1. The ET tube is 4.6 cm above the carina. 2. Significant bilateral airspace process, right slightly greater than left. Electronically Signed   By: Marijo Sanes M.D.   On: 10/09/2018 18:45     PERTINENT LAB RESULTS: CBC: Recent Labs  10/20/18 0400  WBC 9.5  HGB 14.1  HCT 43.4  PLT 193   CMET CMP     Component Value Date/Time   NA 136 10/22/2018 0343   K 3.6 10/22/2018 0343   CL 101 10/22/2018 0343   CO2 26 10/22/2018 0343   GLUCOSE 78 10/22/2018 0343   BUN 14 10/22/2018 0343   CREATININE 0.81 10/22/2018 0343   CALCIUM 8.1 (L) 10/22/2018 0343   PROT 6.5 10/22/2018 0343   ALBUMIN 2.7 (L) 10/22/2018 0343   AST 28 10/22/2018 0343   ALT 54 (H) 10/22/2018 0343   ALKPHOS 45 10/22/2018 0343   BILITOT 0.5 10/22/2018 0343   GFRNONAA >60 10/22/2018 0343   GFRAA >60 10/22/2018 0343    GFR Estimated Creatinine Clearance: 169.4 mL/min (by C-G formula based on SCr of 0.81 mg/dL). No results for input(s): LIPASE, AMYLASE in the last 72 hours. No results for input(s): CKTOTAL, CKMB, CKMBINDEX, TROPONINI in the last 72 hours. Invalid input(s): POCBNP No results for input(s): DDIMER in the last 72 hours. No results for input(s): HGBA1C in the last 72 hours. No results for input(s): CHOL, HDL, LDLCALC, TRIG, CHOLHDL, LDLDIRECT in the last 72 hours. No results for input(s): TSH, T4TOTAL, T3FREE, THYROIDAB in the last 72 hours.  Invalid input(s): FREET3 No results for input(s): VITAMINB12, FOLATE, FERRITIN, TIBC, IRON, RETICCTPCT in the last 72 hours. Coags: No results for input(s): INR in the last 72 hours.  Invalid input(s): PT Microbiology: No results found for this or any previous visit (from the past 240 hour(s)).  FURTHER DISCHARGE INSTRUCTIONS:  Get Medicines reviewed and adjusted: Please take all your medications with you for  your next visit with your Primary MD  Laboratory/radiological data: Please request your Primary MD to go over all hospital tests and procedure/radiological results at the follow up, please ask your Primary MD to get all Hospital records sent to his/her office.  In some cases, they will be blood work, cultures and biopsy results pending at the time of your discharge. Please request that your primary care M.D. goes through all the records of your hospital data and follows up on these results.  Also Note the following: If you experience worsening of your admission symptoms, develop shortness of breath, life threatening emergency, suicidal or homicidal thoughts you must seek medical attention immediately by calling 911 or calling your MD immediately  if symptoms less severe.  You must read complete instructions/literature along with all the possible adverse reactions/side effects for all the Medicines you take and that have been prescribed to you. Take any new Medicines after you have completely understood and accpet all the possible adverse reactions/side effects.   Do not drive when taking Pain medications or sleeping medications (Benzodaizepines)  Do not take more than prescribed Pain, Sleep and Anxiety Medications. It is not advisable to combine anxiety,sleep and pain medications without talking with your primary care practitioner  Special Instructions: If you have smoked or chewed Tobacco  in the last 2 yrs please stop smoking, stop any regular Alcohol  and or any Recreational drug use.  Wear Seat belts while driving.  Please note: You were cared for by a hospitalist during your hospital stay. Once you are discharged, your primary care physician will handle any further medical issues. Please note that NO REFILLS for any discharge medications will be authorized once you are discharged, as it is imperative that you return to your primary care physician (or establish a relationship with a primary  care physician  if you do not have one) for your post hospital discharge needs so that they can reassess your need for medications and monitor your lab values.  Total Time spent coordinating discharge including counseling, education and face to face time equals 35 minutes.  SignedOren Binet 10/22/2018 8:53 AM

## 2018-10-22 NOTE — Discharge Summary (Signed)
EDCM placed override on MATCH card to provide $0 co-pay for Rx.

## 2018-10-22 NOTE — TOC Transition Note (Addendum)
Transition of Care [facility]) - CM/SW Discharge Note   Patient Details  Name: Roy Andrews MRN: [id number] Date of Birth: 1971/04/30  [facility] of [facility]) CM/SW Contact:  Ninfa Meeker, RN Phone Number: [phone] ( working remotely) 10/22/2018, 10:47 AM   Clinical Narrative:   48 yr old gentleman that has been admitted and treated for COVID 19. Thankfully he has progressed and will be able to discharge home. Patient's scripts have been sent to [facility] to be filled, MATCH assistance provided.Medications will be delivered to [facility] pharmacist by courier from [facility]. CM informed bedside RN that patient not to discharge until meds received and oxygen concentrator delivered to patient's home.  CM called [facility] Liaison, [place] with request for Home Health PT. CM informed that they will likely not provide service because he has no SS#, and per therapy notes he is progressing well. Case Manager updated Dr. Nigel Bridgeman. CM contacted hospital [facility], [place] and requested oxygen tank and 3in1, pulse ox and thermometer be taken to patient's room for discharge after everything is in place. Televisit  follow up appointment has been scheduled for Friday, October 31, 2018 at 10:50am with Freeman Caldron, NP with [facility]. Appt is on the AVS.    Final next level of care: Home/Self Care Barriers to Discharge: No Barriers Identified   Patient Goals and CMS Choice        Discharge Placement                       Discharge Plan and Services   Discharge Planning Services: CM Consult, Medication Assistance, [place] Program, Follow-up appt scheduled Post Acute Care Choice: Durable Medical Equipment          DME Arranged: 3-N-1, Oxygen DME Agency: [place] Date DME Agency Contacted: 10/22/18 Time DME Agency Contacted: [id number] Representative spoke with at DME Agency: Learta Codding [facility] Arranged: NA          Social Determinants of  Health ([place]) Interventions     Readmission Risk Interventions No flowsheet data found.

## 2018-10-22 NOTE — Care Management (Signed)
   MATCH Medication Assistance Card Valrie HartLen Azeez Mayfield  ID [phone]  BIN: [id number] RX Group: [id number]  Toma Copier de alta: 06/23/18  Toma Copier de expiracion: 06/29/18     Usted ha sido aprobado para tener sus recetas llenas de descarga a travs de nuestro partido (Asistencia de Medicamentos Cono Salud a travs) del Therapist, occupational. Este programa permite una sola vez (sin Higher education careers adviser) suministro de [address] de medicamentos seleccionados por un monto bajo copago.  El copago es de $ 3.00 por receta. Por ejemplo, si usted tiene una receta, usted pagar $ 3,00; [employer], usted paga $ 6,00; para tres recetas, usted paga $ 9.00; etctera.  Override entered for co-pay  Slo ciertas farmacias estn participando en este programa con [employer]. Usted tendr [employer] elegir una de las farmacias de la lista adjunta y tomar sus medicamentos, esta carta y su identidad con fotografa a una de las [employer].  Estamos muy contentos de que usted es capaz de Risk manager el programa del partido para obtener sus medicamentos. Estas recetas se deben surtir Microbiologist de 7 das del alta hospitalaria o que ya no sern vlidos para el programa de partido. [place] de su equipo de gestin de [place] en [phone] Si tiene algn problema con sus recetas por favor pngase en contacto.  [place],   [place]

## 2018-10-22 NOTE — Progress Notes (Signed)
Physical Therapy Treatment Patient Details Name: Elmon Shader MRN: [id number] DOB: Oct 07, 1970 Today's Date: 10/22/2018          PT Comments    Received call re: ? Need for HHPT on discharge. Patient has progressed to the point that HHPT is no longer indicated.    Follow Up Recommendations  No PT follow up     Equipment Recommendations  None recommended by PT     PT Plan Discharge plan needs to be updated                                                                                  Rexanne Mano, PT 10/22/2018, 11:56 AM

## 2018-10-31 ENCOUNTER — Other Ambulatory Visit: Payer: Self-pay

## 2018-10-31 ENCOUNTER — Ambulatory Visit: Payer: HRSA Program | Attending: Family Medicine | Admitting: Physician Assistant

## 2018-10-31 DIAGNOSIS — U071 COVID-19: Secondary | ICD-10-CM | POA: Diagnosis not present

## 2018-10-31 DIAGNOSIS — Z09 Encounter for follow-up examination after completed treatment for conditions other than malignant neoplasm: Secondary | ICD-10-CM

## 2018-10-31 DIAGNOSIS — E1165 Type 2 diabetes mellitus with hyperglycemia: Secondary | ICD-10-CM | POA: Diagnosis not present

## 2018-10-31 DIAGNOSIS — J988 Other specified respiratory disorders: Secondary | ICD-10-CM | POA: Diagnosis not present

## 2018-10-31 NOTE — Progress Notes (Signed)
Virtual Visit via Telephone Note  I connected with Roy Andrews on 10/31/18 at 10:50 AM EDT by telephone and verified that I am speaking with the correct person using two identifiers.   I discussed the limitations, risks, security and privacy concerns of performing an evaluation and management service by telephone and the availability of in person appointments. I also discussed with the patient that there may be a patient responsible charge related to this service. The patient expressed understanding and agreed to proceed.  Patient location: home My Location:  [facility] office Persons on the call: myself, the patient, and the interpreter   History of Present Illness: After hospitalization 6/2-6/15 for Covid-19.  He is feeling much better.  Denies SOB.  Only using oxygen occasionally. Cough is much improved.  No fever.    Blood sugars running 100-140  He obtained all of his medications and is compliant with taking them.    From discharge summary: Brief Hospital Course: Acute Hypoxic Resp Failure due to Covid 19 Viral pneumonia:Improved-on room air this morning but desaturates with ambulation-probably will require home O2 which has been ordered.  Has completed 5-day course of Remdesivir, when he was in the ICU he also got convalescent plasma.  Since clinically improved-stable for discharge home today.  Case management consult ordered prior to discharge for home O2, follow-up appointments and medication assistance  Mild transaminitis:Improving-secondary to COVID-19  DM-2 (A1c 10.0): CBGs stable-continue insulin 70/30 15 units twice daily.   Start metformin on discharge.  Further optimization deferred to the outpatient setting  OSA: Probably will require sleep study and CPAP trial in the outpatient setting (per spouse-has been intolerant to CPAP in the past).  Morbid Obesity   Observations/Objective:  A&Ox3.  Sounds out of breath at times but denies SOB.    Assessment  and Plan: 1. COVID-19 Continue quarantine, fluids, rest, O2 as needed  2. Hospital discharge follow-up Improving.  RTW Monday 6/29.  3. Type 2 diabetes mellitus with hyperglycemia, unspecified whether long term insulin use ([place]) Improving control.      Follow Up Instructions: appt in mid July   I discussed the assessment and treatment plan with the patient. The patient was provided an opportunity to ask questions and all were answered. The patient agreed with the plan and demonstrated an understanding of the instructions.   The patient was advised to call back or seek an in-person evaluation if the symptoms worsen or if the condition fails to improve as anticipated.  I provided 21 minutes of non-face-to-face time during this encounter.   Freeman Caldron, PA-C  Patient ID: Roy Andrews, male   DOB: [birth year], 48 y.o.   MRN: [id number]

## 2018-10-31 NOTE — Progress Notes (Signed)
Cont'd cough with some phlegm Has not needed to use Albuterol inhaler

## 2018-11-28 ENCOUNTER — Encounter: Payer: Self-pay | Admitting: Family Medicine

## 2018-11-28 ENCOUNTER — Ambulatory Visit: Payer: HRSA Program | Attending: Family Medicine | Admitting: Family Medicine

## 2018-11-28 ENCOUNTER — Other Ambulatory Visit: Payer: Self-pay

## 2018-11-28 DIAGNOSIS — R51 Headache: Secondary | ICD-10-CM

## 2018-11-28 DIAGNOSIS — R519 Headache, unspecified: Secondary | ICD-10-CM

## 2018-11-28 DIAGNOSIS — J1282 Pneumonia due to coronavirus disease 2019: Secondary | ICD-10-CM

## 2018-11-28 DIAGNOSIS — U071 COVID-19: Secondary | ICD-10-CM

## 2018-11-28 DIAGNOSIS — E1165 Type 2 diabetes mellitus with hyperglycemia: Secondary | ICD-10-CM | POA: Diagnosis not present

## 2018-11-28 DIAGNOSIS — J029 Acute pharyngitis, unspecified: Secondary | ICD-10-CM

## 2018-11-28 DIAGNOSIS — J1289 Other viral pneumonia: Secondary | ICD-10-CM

## 2018-11-28 MED ORDER — PREDNISONE 20 MG PO TABS: ORAL_TABLET | ORAL | 0 refills | Status: DC

## 2018-11-28 MED ORDER — MAGIC MOUTHWASH: 10.0000 mL | Freq: Four times a day (QID) | ORAL | 1 refills | Status: DC

## 2018-11-28 NOTE — Progress Notes (Signed)
Patient is establishing care for primary care. Pt is also following up on COVID.  Pt. Stated he has been difficulty breathing due to something stripping his throat and cannot catch his breath well.

## 2018-11-28 NOTE — Progress Notes (Signed)
Virtual Visit via Telephone Note  I connected with Roy Andrews on 11/28/18 at  2:50 PM EDT by telephone and verified that I am speaking with the correct person using two identifiers.   I discussed the limitations, risks, security and privacy concerns of performing an evaluation and management service by telephone and the availability of in person appointments. I also discussed with the patient that there may be a patient responsible charge related to this service. The patient expressed understanding and agreed to proceed.  Patient Location: Home Provider Location: [facility] office  Others participating in call:  Call initiated by Vidal SchwalbeBien Yy. CMA who contacted [employer] to obtain a Spanish speaking interpreter for the patient due to a language barrier   History of Present Illness:      48 yo male who is s/p hospitalization from 10/09/2018 through 10/22/2018 secondary to acute hypoxic respiratory failure due to COVID-19 pneumonia.  Patient was intubated on 10/09/2018 and was able to be extubated on 10/16/2018.  Patient was treated with a 5-day course of Remdesivir as well as convalescent plasma while in the ICU.  Patient continued to desaturate with ambulation and was therefore discharged on home oxygen.  He is also diabetic and his hemoglobin A1c during hospitalization was 10.0 and he was discharged on 70/30 insulin 15 units twice daily along with prescription to start metformin on discharge.  He also had an elevation in liver enzymes related to his illness.  Mention was also made in discharge summary that patient with obesity and past diagnosis of sleep apnea but his wife reported that patient with inability to tolerate a sleep study in the past.  He had 10/31/2018 telemedicine visit to establish care at this office with another provider.      Today he reports that he has a continued sore throat since his hospitalization.  He feels as if he has burning in his mouth and throat when drinking water.  During  the phone call, patient's wife looked into his mouth/throat and there was no evidence of exudate or white discharge.  Patient reports that his blood sugars are fairly well controlled with fasting blood sugars in the 140/1 50s but he admits that he is not checking his blood sugars regularly.  He does feel that his appetite has improved status post hospitalization.  He denies any current issues with fever.  He does not believe that he has had a fever since his hospitalization.  He occasionally has a dry, nonproductive cough.  He has some mild shortness of breath with activity.  He usually does not need his oxygen when sitting still as his oxygen saturations are staying 95% or greater when he is at rest but he does wear his oxygen at night or if he notices that his saturation has dropped below 95 when he is walking within his home.  He does have some sensation of chest tightness/wheezing with ambulation.  He denies left-sided chest pain.  He has had no abdominal pain-no nausea/vomiting or diarrhea.  He has noticed that he is giving a recurrent headache that is dull and generalized.  Headache is about a 3-4 on a 0-to-10 scale.  He does not feel as if he has any nasal congestion.  He does not feel as if the headache occurs after awakening.   Past Medical History:  Diagnosis Date  . Diabetes mellitus without complication (HCC)   . Obese     History reviewed. No pertinent surgical history.  Family History  Problem Relation  Age of Onset  . Diabetes Mother     Social History   Tobacco Use  . Smoking status: Never Smoker  . Smokeless tobacco: Never Used  Substance Use Topics  . Alcohol use: No    Frequency: Never  . Drug use: No     No Known Allergies     Observations/Objective: No vital signs or physical exam conducted as visit was done via telephone.  Patient was noted to have a hoarse, diminished speaking voice  Assessment and Plan: 1. Pneumonia due to COVID-19 virus Patient still with some  use of oxygen when he feels fatigued or oxygen saturations drop to 92-94% on room air-use of oxygen brings oxygen saturation back to 95%. Still with cough and some occasional wheezing.  Patient is to have chest x-ray in the next 1 to 2 weeks, sooner if increased shortness of breath, decreased oxygen saturation or any concerns.  If he has any acute worsening of his breathing then he should go to the emergency department for further evaluation. - predniSONE (DELTASONE) 20 MG tablet; Take 2 pills once per day for 5 days; take after eating  Dispense: 10 tablet; Refill: 0 - DG Chest 2 View; Future  2. Pharyngitis, unspecified etiology Patient with complaint of a sore throat since hospitalization.  Discussed with the patient that part of his sore throat was likely related to his intubation.  Patient did have his spouse look into his throat during the visit to see if there is any evidence of exudate/white plaques and wife reported no exudate or plaques.  Patient is also diabetic and concern is that he may have some thrush.  Prescription sent to patient's pharmacy for Magic mouthwash to use 4 times daily.  He is aware that he may swish and spit/gargle the medication 4 times daily or can swallow the medication.  He denies any issues with throat or tongue swelling and no difficulty swallowing. - magic mouthwash SOLN; Take 10 mLs by mouth 4 (four) times daily for 10 days.  Dispense: 400 mL; Refill: 1  3. Daily headache Patient with complaint of daily headache.  I am unsure of the etiology of his headache.  He is encouraged to take Tylenol in case it is related to COVID.  Discussed possibility of sleep apnea causing or contributing to his headaches.  Patient is not interested in sleep study.  - predniSONE (DELTASONE) 20 MG tablet; Take 2 pills once per day for 5 days; take after eating  Dispense: 10 tablet; Refill: 0  4.  Type 2 diabetes, uncontrolled with use of insulin Patient made aware that the prednisone that  has been prescribed may temporarily increase his blood sugars and he may need to increase his 70/30 insulin to help with hyperglycemia and should call the office if his blood sugars are staying greater than 200.  He has been asked to make a follow-up appointment in 1 week for telemedicine visit but go to the ED if he has any acute worsening of any of his symptoms or any concerns.  Follow Up Instructions:Return in about 1 week (around 12/05/2018).    I discussed the assessment and treatment plan with the patient. The patient was provided an opportunity to ask questions and all were answered. The patient agreed with the plan and demonstrated an understanding of the instructions.   The patient was advised to call back or seek an in-person evaluation if the symptoms worsen or if the condition fails to improve as anticipated.  I provided  29 minutes of non-face-to-face time during this encounter.   Cain Saupeammie Tomekia Helton, MD

## 2018-12-02 ENCOUNTER — Encounter: Payer: Self-pay | Admitting: Family Medicine

## 2018-12-03 ENCOUNTER — Telehealth: Payer: Self-pay | Admitting: *Deleted

## 2018-12-03 ENCOUNTER — Telehealth: Payer: Self-pay | Admitting: General Practice

## 2018-12-03 NOTE — Telephone Encounter (Signed)
[place] [address] and spoke with pharmacist Katrina and informed her with verbal order per provider. Called patient and informed him with information and he verbalized understanding.

## 2018-12-03 NOTE — Telephone Encounter (Signed)
Patient called stating he had questions as to why he have to take the prednisone. Per pt he is still not feeling well and would like to go over what was talked about during his last visit. Per pt, he is still not feeling well and his throat is still hurting. Per pt he is still having headaches. Per pt he thought the doctor told him to use something for him to switch in his mouth and his throat and spit it out. Per pt he's been taking this medication for 3 days now and he still do not feel better. Staff informed patient that provider gived him the prednisone to help with his throat and to try to give the medication a little more time to work in his system. Informed patient that the provider gived him magic mouthwash to also help with his throat and told him to take Tylenol for the headache he is having. Per pt he did not receive the mouth wash when he went to the pharmacy. But he will continue taking the other medications as instructed by the provider. Informed patient that message about his mouth was will be sent to his provider and staff will update him as to what happened. Patient agreed and verbalized understanding.

## 2018-12-03 NOTE — Telephone Encounter (Signed)
The prednisone was for his chest tightness and wheezing. The signed/printed RX for the magic mouthwash needed to be faxed to the pharmacy but can be called in as a verbal order as I am working at another clinic today

## 2018-12-03 NOTE — Telephone Encounter (Signed)
Patient stated he did not get his mouth wash that was prescribed on 11-28-18. Patient would like that sent to [facility] off [address].

## 2018-12-03 NOTE — Telephone Encounter (Signed)
Patient called wanting to get more information on what the pednisone is for. Please follow up.

## 2018-12-06 ENCOUNTER — Other Ambulatory Visit: Payer: Self-pay

## 2018-12-06 ENCOUNTER — Ambulatory Visit: Payer: Self-pay | Attending: Family Medicine | Admitting: Physician Assistant

## 2018-12-06 DIAGNOSIS — R51 Headache: Secondary | ICD-10-CM

## 2018-12-06 DIAGNOSIS — Z789 Other specified health status: Secondary | ICD-10-CM

## 2018-12-06 DIAGNOSIS — R519 Headache, unspecified: Secondary | ICD-10-CM

## 2018-12-06 DIAGNOSIS — J029 Acute pharyngitis, unspecified: Secondary | ICD-10-CM

## 2018-12-06 DIAGNOSIS — R1310 Dysphagia, unspecified: Secondary | ICD-10-CM

## 2018-12-06 MED ORDER — IBUPROFEN 600 MG PO TABS: 600.0000 mg | ORAL_TABLET | Freq: Three times a day (TID) | ORAL | 0 refills | Status: DC | PRN

## 2018-12-06 MED ORDER — MAGIC MOUTHWASH: 10.0000 mL | Freq: Four times a day (QID) | ORAL | 1 refills | Status: AC

## 2018-12-06 MED ORDER — METHOCARBAMOL 500 MG PO TABS: 500.0000 mg | ORAL_TABLET | Freq: Three times a day (TID) | ORAL | 0 refills | Status: DC | PRN

## 2018-12-06 MED FILL — METHOCARBAMOL 500 MG TABS: 500 | 30 days supply | Qty: 90 | Fill #0

## 2018-12-06 MED FILL — IBUPROFEN 600 MG TABLET: 600 | 10 days supply | Qty: 30 | Fill #0

## 2018-12-06 NOTE — Progress Notes (Signed)
Virtual Visit via Telephone Note  I connected with [name] on 12/06/18 at  9:50 AM EDT by telephone and verified that I am speaking with the correct person using two identifiers.   I discussed the limitations, risks, security and privacy concerns of performing an evaluation and management service by telephone and the availability of in person appointments. I also discussed with the patient that there may be a patient responsible charge related to this service. The patient expressed understanding and agreed to proceed.  Patient location:  home My Location:  [place] office Persons on the call:  Me, the patient, and Sharlet Salina the interpreter  History of Present Illness: Patient c/o sore throat and pain with swallowing food or drink.  This started about 1 week after hospitalization.  He has also been having HA.  Throat hurts all the time.  Magic mouthwash not helping. Prednisone didn't help.  He was intubated in June when he was hospitalized for Covid infection.    Pain in head starts at back of head and neck.  Occurs once every 2-3 days.  No vision changes.  No N/V.     Observations/Objective:  A&Ox3.  Voice sounds a little hoarse.     Assessment and Plan: 1. Dysphagia, unspecified type -recent intubation.   - Ambulatory referral to ENT  2. Pharyngitis, unspecified etiology - magic mouthwash SOLN; Take 10 mLs by mouth 4 (four) times daily for 10 days.  Dispense: 400 mL; Refill: 1 - Mononucleosis Test, Qual W/ Reflex; Future - Ambulatory referral to ENT  3. Nonintractable headache, unspecified chronicity pattern, unspecified headache type - TSH; Future - CBC with Differential/Platelet; Future - Comprehensive metabolic panel; Future - ibuprofen (ADVIL) 600 MG tablet; Take 1 tablet (600 mg total) by mouth every 8 (eight) hours as needed.  Dispense: 30 tablet; Refill: 0 - methocarbamol (ROBAXIN) 500 MG tablet; Take 1 tablet (500 mg total) by mouth every 8 (eight) hours as  needed for muscle spasms. Or headache  Dispense: 90 tablet; Refill: 0  4. Language barrier [employer] interpre[employer] used and additional time performing visit was required.   Follow Up Instructions: 2 months with PCP   I discussed the assessment and treatment plan with the patient. The patient was provided an opportunity to ask questions and all were answered. The patient agreed with the plan and demonstrated an understanding of the instructions.   The patient was advised to call back or seek an in-person evaluation if the symptoms worsen or if the condition fails to improve as anticipated.  I provided 18 minutes of non-face-to-face time during this encounter.   Freeman Caldron, PA-C  Patient ID: Roy Andrews, male   DOB: Oct 02, 1970, 48 y.o.   MRN: [id number]

## 2018-12-06 NOTE — Progress Notes (Signed)
Pt. Stated when he swallow it hurts. Feels like cut in his throat.

## 2018-12-10 ENCOUNTER — Ambulatory Visit: Payer: Self-pay | Attending: Family Medicine

## 2018-12-10 ENCOUNTER — Other Ambulatory Visit: Payer: Self-pay

## 2018-12-10 DIAGNOSIS — J029 Acute pharyngitis, unspecified: Secondary | ICD-10-CM

## 2018-12-10 DIAGNOSIS — R519 Headache, unspecified: Secondary | ICD-10-CM

## 2018-12-11 LAB — CBC WITH DIFFERENTIAL/PLATELET
Basophils Absolute: 0.1 10*3/uL (ref 0.0–0.2)
Basos: 1 %
EOS (ABSOLUTE): 0.3 10*3/uL (ref 0.0–0.4)
Eos: 2 %
Hematocrit: 43.8 % (ref 37.5–51.0)
Hemoglobin: 13.9 g/dL (ref 13.0–17.7)
Immature Grans (Abs): 0.1 10*3/uL (ref 0.0–0.1)
Immature Granulocytes: 1 %
Lymphocytes Absolute: 3.4 10*3/uL — ABNORMAL HIGH (ref 0.7–3.1)
Lymphs: 30 %
MCH: 28.9 pg (ref 26.6–33.0)
MCHC: 31.7 g/dL (ref 31.5–35.7)
MCV: 91 fL (ref 79–97)
Monocytes Absolute: 1 10*3/uL — ABNORMAL HIGH (ref 0.1–0.9)
Monocytes: 9 %
Neutrophils Absolute: 6.7 10*3/uL (ref 1.4–7.0)
Neutrophils: 57 %
Platelets: 243 10*3/uL (ref 150–450)
RBC: 4.81 x10E6/uL (ref 4.14–5.80)
RDW: 13.7 % (ref 11.6–15.4)
WBC: 11.5 10*3/uL — ABNORMAL HIGH (ref 3.4–10.8)

## 2018-12-11 LAB — COMPREHENSIVE METABOLIC PANEL
ALT: 10 IU/L (ref 0–44)
AST: 7 IU/L (ref 0–40)
Albumin/Globulin Ratio: 1.3 (ref 1.2–2.2)
Albumin: 4.1 g/dL (ref 4.0–5.0)
Alkaline Phosphatase: 53 IU/L (ref 39–117)
BUN/Creatinine Ratio: 17 (ref 9–20)
BUN: 15 mg/dL (ref 6–24)
Bilirubin Total: 0.3 mg/dL (ref 0.0–1.2)
CO2: 26 mmol/L (ref 20–29)
Calcium: 9.5 mg/dL (ref 8.7–10.2)
Chloride: 97 mmol/L (ref 96–106)
Creatinine, Ser: 0.88 mg/dL (ref 0.76–1.27)
GFR calc Af Amer: 119 mL/min/{1.73_m2} (ref >59)
GFR calc non Af Amer: 103 mL/min/{1.73_m2} (ref >59)
Globulin, Total: 3.1 g/dL (ref 1.5–4.5)
Glucose: 160 mg/dL — ABNORMAL HIGH (ref 65–99)
Potassium: 4.8 mmol/L (ref 3.5–5.2)
Sodium: 137 mmol/L (ref 134–144)
Total Protein: 7.2 g/dL (ref 6.0–8.5)

## 2018-12-11 LAB — TSH: TSH: 1.92 u[IU]/mL (ref 0.450–4.500)

## 2018-12-11 LAB — MONO QUAL W/RFLX QN: Mono Qual W/Rflx Qn: NEGATIVE

## 2018-12-13 ENCOUNTER — Other Ambulatory Visit: Payer: Self-pay | Admitting: Physician Assistant

## 2018-12-13 ENCOUNTER — Telehealth: Payer: Self-pay | Admitting: *Deleted

## 2018-12-13 DIAGNOSIS — R519 Headache, unspecified: Secondary | ICD-10-CM

## 2018-12-13 MED ORDER — IBUPROFEN 600 MG PO TABS: 600.0000 mg | ORAL_TABLET | Freq: Three times a day (TID) | ORAL | 0 refills | Status: DC | PRN

## 2018-12-13 NOTE — Telephone Encounter (Signed)
Called patient regarding lab test.  He request a refill on medications for headaches. The ibuprofen is almost finished and headaches are still there.    Informed patient that he still sounds like he is winded when talking. Encouraged to go to have XR tomorrow rather Monday. Recommended if breathing worsens to go to the ED.    ______________________________________________  Notes recorded by Carilyn Goodpasture, RN on 12/13/2018 at 4:18 PM EDT  Due to language barrier, an interpreter was used. Interpreter name or [employer] [place] # [place], [place].  Pt name and DOB verified. Patient aware of results and result note per Freeman Caldron, PA-C  Reminded to get his CXR  ------   Notes recorded by Argentina Donovan, PA-C on 12/11/2018 at 11:50 AM EDT  Please call patient. Thyroid testing, blood count, and throat infection test are all normal/negative. Blood sugar was elevated as expected. Eliminate sugars/white carbohydrates and take meds as directed. Follow-up as planned. Thanks, Freeman Caldron, PA-C

## 2018-12-13 NOTE — Telephone Encounter (Signed)
Ibuprofen RF.   Thanks, Freeman Caldron, PA-C

## 2018-12-14 ENCOUNTER — Ambulatory Visit (HOSPITAL_COMMUNITY)
Admission: RE | Admit: 2018-12-14 | Discharge: 2018-12-14 | Disposition: A | Discharge status: Home / Self Care | Payer: HRSA Program | Source: Ambulatory Visit | Attending: Family Medicine | Admitting: Family Medicine

## 2018-12-14 ENCOUNTER — Other Ambulatory Visit: Payer: Self-pay

## 2018-12-14 DIAGNOSIS — U071 COVID-19: Secondary | ICD-10-CM | POA: Insufficient documentation

## 2018-12-14 DIAGNOSIS — J1289 Other viral pneumonia: Secondary | ICD-10-CM | POA: Diagnosis present

## 2019-01-11 ENCOUNTER — Ambulatory Visit: Payer: Self-pay | Admitting: Family Medicine

## 2019-01-17 ENCOUNTER — Ambulatory Visit: Payer: Self-pay | Attending: Family Medicine | Admitting: Family Medicine

## 2019-01-17 ENCOUNTER — Encounter: Payer: Self-pay | Admitting: Family Medicine

## 2019-01-17 DIAGNOSIS — R51 Headache: Secondary | ICD-10-CM

## 2019-01-17 DIAGNOSIS — E1165 Type 2 diabetes mellitus with hyperglycemia: Secondary | ICD-10-CM

## 2019-01-17 DIAGNOSIS — Z789 Other specified health status: Secondary | ICD-10-CM

## 2019-01-17 DIAGNOSIS — R1310 Dysphagia, unspecified: Secondary | ICD-10-CM

## 2019-01-17 DIAGNOSIS — R519 Headache, unspecified: Secondary | ICD-10-CM

## 2019-01-17 DIAGNOSIS — Z79899 Other long term (current) drug therapy: Secondary | ICD-10-CM

## 2019-01-17 DIAGNOSIS — R499 Unspecified voice and resonance disorder: Secondary | ICD-10-CM

## 2019-01-17 DIAGNOSIS — R202 Paresthesia of skin: Secondary | ICD-10-CM

## 2019-01-17 DIAGNOSIS — Z8619 Personal history of other infectious and parasitic diseases: Secondary | ICD-10-CM

## 2019-01-17 DIAGNOSIS — Z603 Acculturation difficulty: Secondary | ICD-10-CM

## 2019-01-17 DIAGNOSIS — Z8616 Personal history of COVID-19: Secondary | ICD-10-CM

## 2019-01-17 DIAGNOSIS — Z758 Other problems related to medical facilities and other health care: Secondary | ICD-10-CM

## 2019-01-17 MED ORDER — IBUPROFEN 600 MG PO TABS: 600.0000 mg | ORAL_TABLET | Freq: Three times a day (TID) | ORAL | 1 refills | Status: DC | PRN

## 2019-01-17 MED ORDER — METFORMIN HCL 500 MG PO TABS: 500.0000 mg | ORAL_TABLET | Freq: Two times a day (BID) | ORAL | 5 refills | Status: DC

## 2019-01-17 MED ORDER — METHOCARBAMOL 500 MG PO TABS: 500.0000 mg | ORAL_TABLET | Freq: Three times a day (TID) | ORAL | 2 refills | Status: DC | PRN

## 2019-01-17 MED ORDER — OMEPRAZOLE 40 MG PO CPDR: 40.0000 mg | DELAYED_RELEASE_CAPSULE | Freq: Every day | ORAL | 3 refills | Status: DC

## 2019-01-17 NOTE — Progress Notes (Signed)
Virtual Visit via Telephone Note  I connected with@ on 02/24/19 at  2:50 PM EDT by telephone and verified that I am speaking with the correct person using two identifiers.   I discussed the limitations, risks, security and privacy concerns of performing an evaluation and management service by telephone and the availability of in person appointments. I also discussed with the patient that there may be a patient responsible charge related to this service. The patient expressed understanding and agreed to proceed.  Patient Location: Home Provider Location: [facility] office Others participating in call: call initiated by Doloris Hall, CMA who obtained a Spanish speaking interpreter through [employer] and then transferred the call to me   History of Present Illness:        48 yo male who is s/p visit on 12/06/2018 with another provider. He reports that his headaches are improved. Headaches were every day but now about 2 per week with use of medications prescribed at his last visit and he would like refills of these medications.  Headaches occur at the right back of the scalp/neck.  Headaches are dull, aching sensation.  Headaches are now about a 4 on a 0-to-10 scale with 10 being the worst imaginable pain.  His headaches are less intense than before his last visit. He now has no issues with swallowing but has some burning in his throat when his throat is dry.  With further questioning, patient admits that he feels that solid foods go down slowly when he tries to swallow.  He also reports that he has had 3 months of hoarseness/change in the quality of his voice.  He does have a history of respiratory failure due to COVID-19 which required intubation and is sore throat, change in voice happened after intubation.  He denies any acid reflux symptoms.  No burping/belching or backwash of fluid into the throat.  Patient does not feel as if he needs a medication for acid reflux.        Patient  reports that his morning blood sugars are in the 140s to 150s.  He is taking his medications daily.  He denies any issues with the medication.  He has had sensation of numbness in the bottom of his left foot, in the left leg below the knee as well as on the left side of the scalp.  He denies any increased thirst or blurred vision.  No increased thirst but he tends to drink a lot of water throughout the day.  He may need a refill of metformin.  Otherwise, he is feeling well, plus fatigue.  No chest pain or palpitations, no focal weakness, no abdominal pain-no nausea/vomiting/diarrhea or constipation.  No blood in the stool or dark stools.  No swelling of extremities.  Past Medical History:  Diagnosis Date  . Diabetes mellitus without complication ([place])   . Obese     Past Surgical History:  Procedure Laterality Date  . NO PAST SURGERIES      Family History  Problem Relation Age of Onset  . Diabetes Mother     Social History   Tobacco Use  . Smoking status: Never Smoker  . Smokeless tobacco: Never Used  Substance Use Topics  . Alcohol use: No    Frequency: Never  . Drug use: No     No Known Allergies     Observations/Objective: No vital signs or physical exam conducted as visit was done via telephone  Assessment and Plan: 1. Dysphagia, unspecified type; history  of COVID-19 Patient reports improvement in dysphagia status post hospitalization but continues to have abnormal sensation in his throat.  Patient will be referred to GI for further evaluation and prescription given for omeprazole 40 mg in case symptoms are related to acid reflux as patient also with hoarseness.  Patient was also advised to avoid late night eating and to avoid spicy/greasy food. - Ambulatory referral to Gastroenterology - omeprazole (PRILOSEC) 40 MG capsule; Take 1 capsule (40 mg total) by mouth daily. To reduce stomach acid  Dispense: 30 capsule; Refill: 3  2. Uncontrolled type 2 diabetes mellitus with  hyperglycemia (HCC) Patient's hemoglobin A1c during his hospitalization was elevated at 10.0 on October 11, 2018.  Discussed the need for improvement in control of blood sugars with A1c goal of 7 or less as well as goal of blood sugars between 90 and 120 fasting and blood sugars below 160 within 2 hours of a meal and eventually below 140 within 2 hours of a meal.  Discussed adjustment of patient's current 70/30 insulin and continuation of metformin to help better control blood sugars.  Patient is asked to schedule lab visit to have repeat hemoglobin A1c, as well as comprehensive metabolic panel.  We will also check vitamin B12 as patient is on metformin which can lower B12 levels and patient has complaint of scalp paresthesias.  We will also check TSH as patient with complaint of hoarseness/change in the quality of his voice but patient is also status post hospitalization due to COVID-19 which also required intubation. - Comprehensive metabolic panel; Future - Hemoglobin A1c; Future - Vitamin B12 - TSH - metFORMIN (GLUCOPHAGE) 500 MG tablet; Take 1 tablet (500 mg total) by mouth 2 (two) times daily with a meal.  Dispense: 60 tablet; Refill: 5  3. Hoarseness or changing voice Patient with complaint of hoarseness and possible symptoms consistent with acid reflux.  Patient is also status post COVID-19 infection earlier in the year for which he required intubation secondary to acute respiratory failure from COVID-19 pneumonia.  Intubation may also have affected patient vocal quality.  Patient will be referred to ENT for further evaluation and treatment.  He has also been placed on omeprazole 40 mg and Colace acid reflux is also contributing to his hoarseness.  We will also check TSH to see if thyroid disorder may be affecting his vocal quality. - omeprazole (PRILOSEC) 40 MG capsule; Take 1 capsule (40 mg total) by mouth daily. To reduce stomach acid  Dispense: 30 capsule; Refill: 3 - TSH -Ambulatory referral to  ENT  4. Paresthesia Patient with complaint of scalp paresthesias.  Will check vitamin B12 level. - Vitamin B12  5. Nonintractable headache, unspecified chronicity pattern, unspecified headache type Patient reports improvement in headaches and patient also seems to have some posterior neck muscle spasm/tension type headaches as well.  Refills provided of ibuprofen and new prescription for Robaxin to help with muscle spasm/headache. - ibuprofen (ADVIL) 600 MG tablet; Take 1 tablet (600 mg total) by mouth every 8 (eight) hours as needed.  Dispense: 60 tablet; Refill: 1 - methocarbamol (ROBAXIN) 500 MG tablet; Take 1 tablet (500 mg total) by mouth every 8 (eight) hours as needed for muscle spasms. Or headache  Dispense: 90 tablet; Refill: 2  6. Encounter for long-term (current) use of medications Patient will have comprehensive metabolic panel as well as vitamin B12 level in follow-up of use of medications for the treatment of diabetes - Comprehensive metabolic panel; Future - Vitamin B12  7. Language  barrier [place] interpretation systems used at today's visit due to language barrier  Return in about 6 weeks (around 02/28/2019) for DM/chronic issues-next week; 6 weeks in office.   I discussed the assessment and treatment plan with the patient. The patient was provided an opportunity to ask questions and all were answered. The patient agreed with the plan and demonstrated an understanding of the instructions.   The patient was advised to call back or seek an in-person evaluation if the symptoms worsen or if the condition fails to improve as anticipated.  I provided 28 minutes of non-face-to-face time during this encounter.   Cain Saupe, MD

## 2019-01-17 NOTE — Progress Notes (Signed)
Patient has been called and DOB has been verified. Patient has been screened and transferred to PCP to start phone visit.  Patient needs refill on medications. 

## 2021-03-15 ENCOUNTER — Other Ambulatory Visit: Payer: Self-pay

## 2021-03-15 ENCOUNTER — Emergency Department (HOSPITAL_COMMUNITY): Payer: Self-pay

## 2021-03-15 ENCOUNTER — Emergency Department (HOSPITAL_COMMUNITY)
Admission: EM | Admit: 2021-03-15 | Discharge: 2021-03-16 | Disposition: A | Discharge status: Home / Self Care | Payer: Self-pay | Attending: Emergency Medicine | Admitting: Emergency Medicine

## 2021-03-15 DIAGNOSIS — Z8639 Personal history of other endocrine, nutritional and metabolic disease: Secondary | ICD-10-CM

## 2021-03-15 DIAGNOSIS — Z8616 Personal history of COVID-19: Secondary | ICD-10-CM | POA: Insufficient documentation

## 2021-03-15 DIAGNOSIS — Z794 Long term (current) use of insulin: Secondary | ICD-10-CM | POA: Insufficient documentation

## 2021-03-15 DIAGNOSIS — E119 Type 2 diabetes mellitus without complications: Secondary | ICD-10-CM | POA: Insufficient documentation

## 2021-03-15 DIAGNOSIS — L03115 Cellulitis of right lower limb: Secondary | ICD-10-CM

## 2021-03-15 DIAGNOSIS — L02611 Cutaneous abscess of right foot: Secondary | ICD-10-CM | POA: Insufficient documentation

## 2021-03-15 DIAGNOSIS — Z7984 Long term (current) use of oral hypoglycemic drugs: Secondary | ICD-10-CM | POA: Insufficient documentation

## 2021-03-15 LAB — BASIC METABOLIC PANEL
Anion gap: 10 (ref 5–15)
BUN: 11 mg/dL (ref 6–20)
CO2: 24 mmol/L (ref 22–32)
Calcium: 9.3 mg/dL (ref 8.9–10.3)
Chloride: 99 mmol/L (ref 98–111)
Creatinine, Ser: 0.95 mg/dL (ref 0.61–1.24)
GFR, Estimated: >60 mL/min (ref >60)
Glucose, Bld: 171 mg/dL — ABNORMAL HIGH (ref 70–99)
Potassium: 5 mmol/L (ref 3.5–5.1)
Sodium: 133 mmol/L — ABNORMAL LOW (ref 135–145)

## 2021-03-15 LAB — CBC WITH DIFFERENTIAL/PLATELET
Abs Immature Granulocytes: 0.08 10*3/uL — ABNORMAL HIGH (ref 0.00–0.07)
Basophils Absolute: 0.1 10*3/uL (ref 0.0–0.1)
Basophils Relative: 1 %
Eosinophils Absolute: 0.3 10*3/uL (ref 0.0–0.5)
Eosinophils Relative: 2 %
HCT: 46.9 % (ref 39.0–52.0)
Hemoglobin: 16.4 g/dL (ref 13.0–17.0)
Immature Granulocytes: 1 %
Lymphocytes Relative: 31 %
Lymphs Abs: 3.3 10*3/uL (ref 0.7–4.0)
MCH: 30.3 pg (ref 26.0–34.0)
MCHC: 35 g/dL (ref 30.0–36.0)
MCV: 86.5 fL (ref 80.0–100.0)
Monocytes Absolute: 0.7 10*3/uL (ref 0.1–1.0)
Monocytes Relative: 7 %
Neutro Abs: 6.3 10*3/uL (ref 1.7–7.7)
Neutrophils Relative %: 58 %
Platelets: 241 10*3/uL (ref 150–400)
RBC: 5.42 MIL/uL (ref 4.22–5.81)
RDW: 12 % (ref 11.5–15.5)
WBC: 10.7 10*3/uL — ABNORMAL HIGH (ref 4.0–10.5)
nRBC: 0 % (ref 0.0–0.2)

## 2021-03-15 MED ORDER — PIPERACILLIN-TAZOBACTAM 3.375 G IVPB 30 MIN
3.3750 g | Freq: Once | INTRAVENOUS | Status: AC
  Administered 2021-03-16: 3.375 g via INTRAVENOUS
  Filled 2021-03-15: qty 50

## 2021-03-15 MED ORDER — CEFDINIR 300 MG PO CAPS: 300.0000 mg | ORAL_CAPSULE | Freq: Two times a day (BID) | ORAL | 0 refills | Status: DC

## 2021-03-15 NOTE — ED Provider Notes (Signed)
[facility] EMERGENCY DEPARTMENT Provider Note   CSN: [id number] Arrival date & time: 03/15/21  1241     History Chief Complaint  Patient presents with   Roy Andrews is a 50 y.o. male.  Pt c/o redness and pain to dorsum right foot/ symptoms present in past two weeks. States may have scraped area while driving, but denies significant trauma to foot. Hx iddm, but not compliant w meds for past few months. States symptoms acute onset, moderate, constant, dull, worsening. No hx same. No fever, chills or sweats. No nausea or vomiting, systemically does not feel sick or ill. States went to clinic and got abx shots for a few days, but not resolved. No oral abx use. States was given rx for metformin and lisinopril, and naproxen. Denies lower leg pain or swelling. No bite/sting to area.  No hx gout or finlammatory arthritis.   The history is provided by a relative, the patient and medical records. Language interpreter used: AV interpreter line used, although pts English also is relatively good.  Foot Pain Pertinent negatives include no chest pain, no abdominal pain, no headaches and no shortness of breath.      Past Medical History:  Diagnosis Date   Diabetes mellitus without complication ([place])    Obese     Patient Active Problem List   Diagnosis Date Noted   Pressure injury of skin 10/10/2018   COVID-19 10/09/2018   Acute respiratory distress syndrome (ARDS) due to COVID-19 virus ([place]) 10/09/2018   Acute respiratory failure with hypoxemia [facility])     Past Surgical History:  Procedure Laterality Date   NO PAST SURGERIES         Family History  Problem Relation Age of Onset   Diabetes Mother     Social History   Tobacco Use   Smoking status: Never   Smokeless tobacco: Never  Vaping Use   Vaping Use: Never used  Substance Use Topics   Alcohol use: No   Drug use: No    Home Medications Prior to Admission medications    Medication Sig Start Date End Date Taking? Authorizing Provider  albuterol (VENTOLIN HFA) 108 (90 Base) MCG/ACT inhaler Inhale 2 puffs into the lungs every 6 (six) hours as needed for wheezing or shortness of breath. 10/22/18   Ghimire, Henreitta Leber, MD  blood glucose meter kit and supplies Dispense based on patient and insurance preference. Use up to four times daily as directed. (FOR ICD-10 E10.9, E11.9). 10/22/18   Ghimire, Henreitta Leber, MD  feeding supplement, ENSURE ENLIVE, (ENSURE ENLIVE) LIQD Take 237 mLs by mouth 2 (two) times daily between meals. Patient not taking: Reported on 10/31/2018 10/22/18   Jonetta Osgood, MD  ibuprofen (ADVIL) 600 MG tablet Take 1 tablet (600 mg total) by mouth every 8 (eight) hours as needed. 01/17/19   Fulp, Cammie, MD  insulin aspart protamine- aspart (NOVOLOG MIX 70/30) (70-30) 100 UNIT/ML injection Inject 0.15 mLs (15 Units total) into the skin 2 (two) times daily with a meal. 10/22/18   Ghimire, Henreitta Leber, MD  metFORMIN (GLUCOPHAGE) 500 MG tablet Take 1 tablet (500 mg total) by mouth 2 (two) times daily with a meal. 01/17/19 01/17/20  Fulp, Cammie, MD  methocarbamol (ROBAXIN) 500 MG tablet Take 1 tablet (500 mg total) by mouth every 8 (eight) hours as needed for muscle spasms. Or headache 01/17/19   Fulp, Cammie, MD  omeprazole (PRILOSEC) 40 MG capsule Take 1 capsule (  40 mg total) by mouth daily. To reduce stomach acid 01/17/19   Fulp, Cammie, MD  predniSONE (DELTASONE) 20 MG tablet Take 2 pills once per day for 5 days; take after eating Patient not taking: Reported on 01/17/2019 11/28/18   Antony Blackbird, MD    Allergies    Patient has no known allergies.  Review of Systems   Review of Systems  Constitutional:  Negative for chills, diaphoresis and fever.  HENT:  Negative for sore throat.   Eyes:  Negative for redness.  Respiratory:  Negative for shortness of breath.   Cardiovascular:  Negative for chest pain.  Gastrointestinal:  Negative for abdominal pain, nausea  and vomiting.  Genitourinary:  Negative for flank pain.  Musculoskeletal:  Negative for back pain.  Skin:  Negative for rash.  Neurological:  Negative for headaches.  Hematological:  Does not bruise/bleed easily.  Psychiatric/Behavioral:  Negative for confusion.    Physical Exam Updated Vital Signs BP 119/81 (BP Location: Right Arm)   Pulse 71   Temp 98.4 F (36.9 C)   Resp 16   SpO2 97%   Physical Exam Vitals and nursing note reviewed.  Constitutional:      Appearance: Normal appearance. He is well-developed.  HENT:     Head: Atraumatic.     Nose: Nose normal.     Mouth/Throat:     Mouth: Mucous membranes are moist.  Eyes:     General: No scleral icterus.    Conjunctiva/sclera: Conjunctivae normal.  Neck:     Trachea: No tracheal deviation.  Cardiovascular:     Rate and Rhythm: Normal rate and regular rhythm.     Pulses: Normal pulses.     Heart sounds: Normal heart sounds. No murmur heard.   No friction rub. No gallop.  Pulmonary:     Effort: Pulmonary effort is normal. No accessory muscle usage or respiratory distress.     Breath sounds: Normal breath sounds.  Abdominal:     General: There is no distension.     Palpations: Abdomen is soft.     Tenderness: There is no abdominal tenderness.  Genitourinary:    Comments: No cva tenderness. Musculoskeletal:     Cervical back: Normal range of motion and neck supple. No rigidity.     Comments: Mild sts and tenderness dorsum right foot with mild increased warmth, +erythema. Distal pulses palp. Normal cap refill in toes. No crepitus. No necrotic or devitalized tissue. +dry cracked skin on foot. No fluctuance or abscess noted. No cellulitis or lymphangitis up lower leg. No lower leg pain, erythema or tenderness. No pain w rom at ankle.   Skin:    General: Skin is warm and dry.     Findings: No rash.  Neurological:     Mental Status: He is alert.     Comments: Alert, speech clear. Right foot nvi.   Psychiatric:         Mood and Affect: Mood normal.    ED Results / Procedures / Treatments   Labs (all labs ordered are listed, but only abnormal results are displayed) Results for orders placed or performed during the hospital encounter of 03/15/21  CBC with Differential  Result Value Ref Range   WBC 10.7 (H) 4.0 - 10.5 K/uL   RBC 5.42 4.22 - 5.81 MIL/uL   Hemoglobin 16.4 13.0 - 17.0 g/dL   HCT 46.9 39.0 - 52.0 %   MCV 86.5 80.0 - 100.0 fL   MCH 30.3 26.0 - 34.0 pg  MCHC 35.0 30.0 - 36.0 g/dL   RDW 12.0 11.5 - 15.5 %   Platelets 241 150 - 400 K/uL   nRBC 0.0 0.0 - 0.2 %   Neutrophils Relative % 58 %   Neutro Abs 6.3 1.7 - 7.7 K/uL   Lymphocytes Relative 31 %   Lymphs Abs 3.3 0.7 - 4.0 K/uL   Monocytes Relative 7 %   Monocytes Absolute 0.7 0.1 - 1.0 K/uL   Eosinophils Relative 2 %   Eosinophils Absolute 0.3 0.0 - 0.5 K/uL   Basophils Relative 1 %   Basophils Absolute 0.1 0.0 - 0.1 K/uL   Immature Granulocytes 1 %   Abs Immature Granulocytes 0.08 (H) 0.00 - 0.07 K/uL  Basic metabolic panel  Result Value Ref Range   Sodium 133 (L) 135 - 145 mmol/L   Potassium 5.0 3.5 - 5.1 mmol/L   Chloride 99 98 - 111 mmol/L   CO2 24 22 - 32 mmol/L   Glucose, Bld 171 (H) 70 - 99 mg/dL   BUN 11 6 - 20 mg/dL   Creatinine, Ser 0.95 0.61 - 1.24 mg/dL   Calcium 9.3 8.9 - 10.3 mg/dL   GFR, Estimated >60 >60 mL/min   Anion gap 10 5 - 15   DG Foot Complete Right  Result Date: 03/15/2021 CLINICAL DATA:  Pain and swelling for 2 weeks. Redness. Evaluate for abscess. EXAM: RIGHT FOOT COMPLETE - 3+ VIEW COMPARISON:  None. FINDINGS: Diffuse soft tissue swelling. Hammertoe deformities. No radiopaque foreign object. No soft tissue gas. No osseous destruction. IMPRESSION: Soft tissue swelling, without acute osseous abnormality. Electronically Signed   By: Abigail Miyamoto M.D.   On: 03/15/2021 15:02     EKG None  Radiology DG Foot Complete Right  Result Date: 03/15/2021 CLINICAL DATA:  Pain and swelling for 2 weeks.  Redness. Evaluate for abscess. EXAM: RIGHT FOOT COMPLETE - 3+ VIEW COMPARISON:  None. FINDINGS: Diffuse soft tissue swelling. Hammertoe deformities. No radiopaque foreign object. No soft tissue gas. No osseous destruction. IMPRESSION: Soft tissue swelling, without acute osseous abnormality. Electronically Signed   By: Abigail Miyamoto M.D.   On: 03/15/2021 15:02    Procedures Procedures   Medications Ordered in ED Medications - No data to display  ED Course  I have reviewed the triage vital signs and the nursing notes.  Pertinent labs & imaging results that were available during my care of the patient were reviewed by me and considered in my medical decision making (see chart for details).    MDM Rules/Calculators/A&P                          Iv ns. Labs.   Reviewed nursing notes and prior charts for additional history.   Labs reviewed/interpreted by me - wbc sl elevated.   Xrays reviewed/interpreted by me - no fx.   Zosyn iv.   As not resolved despite prior outpt tx, will get mri r/o deeper space infection or osteo.   2315,MRI pending - signed out to Dr Leonette Monarch - feel that if MRI neg for osteo/deep space infection or abscess, probable d/c to home with close outpatient follow up.    Final Clinical Impression(s) / ED Diagnoses Final diagnoses:  None    Rx / DC Orders ED Discharge Orders     None        Lajean Saver, MD 03/15/21 2315

## 2021-03-15 NOTE — ED Triage Notes (Signed)
Pt reports pain, swelling, and redness to R foot x 1.5 weeks. Denies injury to foot, fevers, or chills.

## 2021-03-15 NOTE — Discharge Instructions (Addendum)
It was our pleasure to provide your ER care today - we hope that you feel better.  Take antibiotic as prescribed. Take acetaminophen as need. Take diabetes meds as prescribed, and follow diabetes eating plan. Return in 3-5 days for a wound recheck. Follow up closely with primary care doctor in the next 2-3 days for diabetes management. Also follow up closely with wound care clinic.   Elevate foot to help with swelling, keep area very clean. For cracked/dry skin, consider use of vaseline, or other non-perfumed, moisturizer.   Return to ER if worse, new symptoms, fevers, spreading redness, worsening or severe pain, or other concern.

## 2021-03-15 NOTE — ED Provider Notes (Signed)
Emergency Medicine Provider Triage Evaluation Note  [facility] Roy Andrews , a 50 y.o. male  was evaluated in triage.  Pt complains of right foot pain over the last week.  No trauma or injury.  Reports associated swelling redness and bump towards the fourth metatarsal.  Was prescribed antibiotics which has improved his swelling and redness however still having pain which presents the ED today.  Review of Systems  Positive:  Negative: Fever, chills   Physical Exam  BP 127/84 (BP Location: Left Arm)   Pulse 86   Temp 98.4 F (36.9 C)   Resp 17   SpO2 95%  Gen:   Awake, no distress   Resp:  Normal effort  MSK:   Moves extremities without difficulty  Other:  2+ pitting edema over the right foot.  2+ dorsalis pedis pulse felt with Doppler.  There is moderate erythema on the lateral dorsal aspect of the foot.  There is 4 cm area of induration and possible fluctuance towards the right metatarsal.  Medical Decision Making  Medically screening exam initiated at 2:26 PM.  Appropriate orders placed.  [facility] Chales Salmon was informed that the remainder of the evaluation will be completed by another provider, this initial triage assessment does not replace that evaluation, and the importance of remaining in the ED until their evaluation is complete.     Honor Loh [place], PA-C 03/15/21 1427    Mancel Bale, MD 03/15/21 [phone]

## 2021-03-16 ENCOUNTER — Telehealth: Payer: Self-pay | Admitting: *Deleted

## 2021-03-16 ENCOUNTER — Emergency Department (HOSPITAL_COMMUNITY): Payer: Self-pay

## 2021-03-16 MED ORDER — GADOBUTROL 1 MMOL/ML IV SOLN
10.0000 mL | Freq: Once | INTRAVENOUS | Status: AC | PRN
  Administered 2021-03-16: 10 mL via INTRAVENOUS

## 2021-03-16 MED ORDER — LIDOCAINE-EPINEPHRINE (PF) 2 %-1:200000 IJ SOLN
10.0000 mL | Freq: Once | INTRAMUSCULAR | Status: AC
  Administered 2021-03-16: 10 mL via INTRADERMAL
  Filled 2021-03-16: qty 20

## 2021-03-16 MED ORDER — SULFAMETHOXAZOLE-TRIMETHOPRIM 800-160 MG PO TABS: 1.0000 | ORAL_TABLET | Freq: Two times a day (BID) | ORAL | 0 refills | Status: AC

## 2021-03-16 NOTE — Telephone Encounter (Signed)
Pt daughter, Joni Reining called regarding pt having received Rx for  sulfamethoxazole-trimethoprim (BACTRIM DS) 800-160 MG tablet  2 times daily         and     cefdinir (OMNICEF) 300 MG capsule  2 times daily,   Status:  Discontinued           RNCM advised that Omincef was discontinued and he only needs to take Bactrim DS.

## 2021-03-16 NOTE — ED Notes (Addendum)
20 G IV removed in RAC. Catheter intact, Clean/dry/ site assessment

## 2021-03-16 NOTE — ED Provider Notes (Signed)
I assumed care of this patient.  Please see previous provider note for further details of Hx, PE.  Briefly patient is a 50 y.o. male who presented foot infection currently pending MRI to assess for deep tissue infection versus osteomyelitis.  MRI notable for cellulitis and developing abscess. Abscesses small and superficial.  Amicable to I&D in the emergency department. Patient already received Zosyn.  On further questioning patient and wife reported that the patient had been receiving daily Rocephin for the past 5 days to treat cellulitis.  Rocephin would be insufficient to treat the abscess. Patient is not septic and feel he would be appropriate for continued outpatient management with appropriate antibiotic.  Will prescribe him Bactrim.   Roy Andrews.Incision and Drainage  Date/Time: 03/16/2021 6:19 AM Performed by: Nira Conn, MD Authorized by: Nira Conn, MD   Consent:    Consent obtained:  Verbal   Consent given by:  Patient   Risks discussed:  Bleeding and damage to other organs   Alternatives discussed:  No treatment Universal protocol:    Immediately prior to procedure, a time out was called: yes     Patient identity confirmed:  Arm band Location:    Type:  Abscess   Size:  2cm   Location:  Lower extremity   Lower extremity location:  Foot   Foot location:  R foot Procedure type:    Complexity:  Complex Procedure details:    Ultrasound guidance: yes     Incision types:  Single straight   Incision depth:  Subcutaneous   Drainage:  Purulent   Drainage amount:  Moderate   Wound treatment:  Wound left open   Packing materials:  1/4 in iodoform gauze Post-procedure details:    Procedure completion:  Tolerated well, no immediate complications  Instructed patient to return in 3 to 5 days for wound check and reassessment of treatment efficacy to determine whether he needs to be admitted at that time for IV antibiotics.  The patient appears reasonably  screened and/or stabilized for discharge and I doubt any other medical condition or other [facility] requiring further screening, evaluation, or treatment in the ED at this time prior to discharge. Safe for discharge with strict return precautions.  Disposition: Discharge  Condition: Good  I have discussed the results, Dx and Tx plan with the patient/family who expressed understanding and agree(s) with the plan. Discharge instructions discussed at length. The patient/family was given strict return precautions who verbalized understanding of the instructions. No further questions at time of discharge.    ED Discharge Orders          Ordered    sulfamethoxazole-trimethoprim (BACTRIM DS) 800-160 MG tablet  2 times daily        03/16/21 0623    cefdinir (OMNICEF) 300 MG capsule  2 times daily,   Status:  Discontinued        03/15/21 2313            Follow Up: [facility] [address] [place] [address] [phone]    [facility]              [address] [place] [address] [phone]  in 3-5 days, for wound re-check  [facility] EMERGENCY DEPARTMENT [address] [id number] mc [place] [address] [phone]         Nira Conn, MD 03/16/21 [phone]

## 2021-03-20 ENCOUNTER — Encounter (HOSPITAL_COMMUNITY): Payer: Self-pay | Admitting: Emergency Medicine

## 2021-03-20 ENCOUNTER — Emergency Department (HOSPITAL_COMMUNITY)
Admission: EM | Admit: 2021-03-20 | Discharge: 2021-03-20 | Disposition: A | Discharge status: Home / Self Care | Payer: Self-pay | Attending: Emergency Medicine | Admitting: Emergency Medicine

## 2021-03-20 ENCOUNTER — Other Ambulatory Visit: Payer: Self-pay

## 2021-03-20 DIAGNOSIS — Z8616 Personal history of COVID-19: Secondary | ICD-10-CM | POA: Insufficient documentation

## 2021-03-20 DIAGNOSIS — Z7984 Long term (current) use of oral hypoglycemic drugs: Secondary | ICD-10-CM | POA: Insufficient documentation

## 2021-03-20 DIAGNOSIS — Z4800 Encounter for change or removal of nonsurgical wound dressing: Secondary | ICD-10-CM | POA: Insufficient documentation

## 2021-03-20 DIAGNOSIS — Z794 Long term (current) use of insulin: Secondary | ICD-10-CM | POA: Insufficient documentation

## 2021-03-20 DIAGNOSIS — Z5189 Encounter for other specified aftercare: Secondary | ICD-10-CM

## 2021-03-20 DIAGNOSIS — E119 Type 2 diabetes mellitus without complications: Secondary | ICD-10-CM | POA: Insufficient documentation

## 2021-03-20 NOTE — ED Triage Notes (Signed)
Pt seen in ED on 11/7 for wound to R foot x 1 1/2 weeks.  Had I&D and told to return for wound check.  Denies fever and chills. States he hasn't changed the bandage.

## 2021-03-20 NOTE — Discharge Instructions (Addendum)
Mantenga la herida limpia y lo ms seca posible. No sumerja ni remoje la herida en agua. Esto significa que no debe nadar, baarse o tomar [facility] alrededor [employer]. Dejar vendajes originales sobre la herida durante las primeras 24 horas. Pasado [employer], se recomienda ducharse o enjuagarse, en lugar de baarse. Film/video editor, retire los vendajes viejos y limpie suavemente la herida con agua y [place]. La [employer] veces al da evita la acumulacin de residuos. Haga un seguimiento en la clnica la prxima semana para repetir el control. Regrese al hospital si presenta fiebre, no puede caminar, [employer], empeora el drenaje de la herida. Contine tomando sus antibiticos.

## 2021-03-20 NOTE — ED Provider Notes (Signed)
[facility] EMERGENCY DEPARTMENT Provider Note   CSN: [id number] Arrival date & time: 03/20/21  7672     History Chief Complaint  Patient presents with   Wound Check    Kenefic is a 50 y.o. male.  This is a 50 y.o. male with significant medical history as below, including DM, obesity who presents to the ED with complaint of foot wound recheck.   Location:  right foot dorsum Duration:  1 wk Onset:  gradual Timing:  constant Description:  pain  improved Severity:  mild Exacerbating/Alleviating Factors:  worse with walking, palpation Associated Symptoms:  mild drainage, purulent  Pertinent Negatives:  no fevers, chills, n/v, no numbness, no cp or dib  Context: pt evaluated in the ER for foot wound 11/7, MRI was negative for osteo, wound I/D by EDP, packed, started on oral abx. Wound improving per the pt, no systemic signs of infection. Taking oral abx as recommended.     The history is provided by the patient and the spouse. No language interpreter was used.  Wound Check Pertinent negatives include no chest pain, no abdominal pain, no headaches and no shortness of breath.       Past Medical History:  Diagnosis Date   Diabetes mellitus without complication ([place])    Obese     Patient Active Problem List   Diagnosis Date Noted   Pressure injury of skin 10/10/2018   COVID-19 10/09/2018   Acute respiratory distress syndrome (ARDS) due to COVID-19 virus ([place]) 10/09/2018   Acute respiratory failure with hypoxemia [facility])     Past Surgical History:  Procedure Laterality Date   NO PAST SURGERIES         Family History  Problem Relation Age of Onset   Diabetes Mother     Social History   Tobacco Use   Smoking status: Never   Smokeless tobacco: Never  Vaping Use   Vaping Use: Never used  Substance Use Topics   Alcohol use: No   Drug use: No    Home Medications Prior to Admission medications   Medication Sig Start  Date End Date Taking? Authorizing Provider  albuterol (VENTOLIN HFA) 108 (90 Base) MCG/ACT inhaler Inhale 2 puffs into the lungs every 6 (six) hours as needed for wheezing or shortness of breath. 10/22/18   Ghimire, Henreitta Leber, MD  blood glucose meter kit and supplies Dispense based on patient and insurance preference. Use up to four times daily as directed. (FOR ICD-10 E10.9, E11.9). 10/22/18   Ghimire, Henreitta Leber, MD  feeding supplement, ENSURE ENLIVE, (ENSURE ENLIVE) LIQD Take 237 mLs by mouth 2 (two) times daily between meals. Patient not taking: Reported on 10/31/2018 10/22/18   Jonetta Osgood, MD  ibuprofen (ADVIL) 600 MG tablet Take 1 tablet (600 mg total) by mouth every 8 (eight) hours as needed. 01/17/19   Fulp, Cammie, MD  insulin aspart protamine- aspart (NOVOLOG MIX 70/30) (70-30) 100 UNIT/ML injection Inject 0.15 mLs (15 Units total) into the skin 2 (two) times daily with a meal. 10/22/18   Ghimire, Henreitta Leber, MD  metFORMIN (GLUCOPHAGE) 500 MG tablet Take 1 tablet (500 mg total) by mouth 2 (two) times daily with a meal. 01/17/19 01/17/20  Fulp, Cammie, MD  methocarbamol (ROBAXIN) 500 MG tablet Take 1 tablet (500 mg total) by mouth every 8 (eight) hours as needed for muscle spasms. Or headache 01/17/19   Fulp, Cammie, MD  omeprazole (PRILOSEC) 40 MG capsule Take 1 capsule (40 mg  total) by mouth daily. To reduce stomach acid 01/17/19   Fulp, Cammie, MD  predniSONE (DELTASONE) 20 MG tablet Take 2 pills once per day for 5 days; take after eating Patient not taking: Reported on 01/17/2019 11/28/18   Fulp, Cammie, MD  sulfamethoxazole-trimethoprim (BACTRIM DS) 800-160 MG tablet Take 1 tablet by mouth 2 (two) times daily for 10 days. 03/16/21 03/26/21  Fatima Blank, MD    Allergies    Patient has no known allergies.  Review of Systems   Review of Systems  Constitutional:  Negative for chills and fever.  HENT:  Negative for facial swelling and trouble swallowing.   Eyes:  Negative for  photophobia and visual disturbance.  Respiratory:  Negative for cough and shortness of breath.   Cardiovascular:  Negative for chest pain and palpitations.  Gastrointestinal:  Negative for abdominal pain, nausea and vomiting.  Endocrine: Negative for polydipsia and polyuria.  Genitourinary:  Negative for difficulty urinating and hematuria.  Musculoskeletal:  Negative for gait problem and joint swelling.  Skin:  Positive for wound. Negative for pallor and rash.  Neurological:  Negative for syncope and headaches.  Psychiatric/Behavioral:  Negative for agitation and confusion.    Physical Exam Updated Vital Signs BP 98/60   Pulse 64   Temp 98.2 F (36.8 C) (Oral)   Resp 18   SpO2 99%   Physical Exam Vitals and nursing note reviewed.  Constitutional:      General: He is not in acute distress.    Appearance: He is well-developed.  HENT:     Head: Normocephalic and atraumatic.     Right Ear: External ear normal.     Left Ear: External ear normal.     Mouth/Throat:     Mouth: Mucous membranes are moist.  Eyes:     General: No scleral icterus. Cardiovascular:     Rate and Rhythm: Normal rate and regular rhythm.     Pulses: Normal pulses.     Heart sounds: Normal heart sounds.  Pulmonary:     Effort: Pulmonary effort is normal. No respiratory distress.     Breath sounds: Normal breath sounds.  Abdominal:     General: Abdomen is flat.     Palpations: Abdomen is soft.     Tenderness: There is no abdominal tenderness.  Musculoskeletal:        General: Normal range of motion.     Cervical back: Normal range of motion.     Right lower leg: No edema.     Left lower leg: No edema.  Feet:     Comments: Wound to right foot, dressing was removed, packing removed. Wound irrigated. Appears to be healing appropriately, no crepitus, no erythema, no significant pain on palpation.  Skin:    General: Skin is warm and dry.     Capillary Refill: Capillary refill takes less than 2 seconds.   Neurological:     Mental Status: He is alert and oriented to person, place, and time.  Psychiatric:        Mood and Affect: Mood normal.        Behavior: Behavior normal.    ED Results / Procedures / Treatments   Labs (all labs ordered are listed, but only abnormal results are displayed) Labs Reviewed - No data to display  EKG None  Radiology No results found.  Procedures Procedures   Medications Ordered in ED Medications - No data to display  ED Course  I have reviewed the triage vital signs  and the nursing notes.  Pertinent labs & imaging results that were available during my care of the patient were reviewed by me and considered in my medical decision making (see chart for details).    MDM Rules/Calculators/A&P                           CC: wound check  This patient complains of above; this involves an extensive number of treatment options and is a complaint that carries with it a high risk of complications and morbidity. Vital signs were reviewed. Serious etiologies considered.  Record review:   Previous records obtained and reviewed   Additional history obtained from spouse  Management: Wound cleaned and dressed at bedside.   Reassessment:  Pt feels back to his baseline, wound appears to be healing well. Appears greatly improved from prior visit. Cleaned and dressed the wound, given post-op shoe. Advised to continue antibiotics and to f/u with care clinic for check in around a week, advised to return to ED if worsening pain, fever, or redness occurs. Otherwise follow up as outpatient.    The patient improved significantly and was discharged in stable condition. Detailed discussions were had with the patient regarding current findings, and need for close f/u with PCP or on call doctor. The patient has been instructed to return immediately if the symptoms worsen in any way for re-evaluation. Patient verbalized understanding and is in agreement with current care  plan. All questions answered prior to discharge.          This chart was dictated using voice recognition software.  Despite best efforts to proofread,  errors can occur which can change the documentation meaning.  Final Clinical Impression(s) / ED Diagnoses Final diagnoses:  Visit for wound check    Rx / DC Orders ED Discharge Orders     None        Jeanell Sparrow, DO 03/20/21 1602

## 2021-04-16 ENCOUNTER — Other Ambulatory Visit: Payer: Self-pay

## 2021-04-16 ENCOUNTER — Encounter (HOSPITAL_BASED_OUTPATIENT_CLINIC_OR_DEPARTMENT_OTHER): Payer: Self-pay | Attending: Internal Medicine | Admitting: Internal Medicine

## 2021-04-16 DIAGNOSIS — E11621 Type 2 diabetes mellitus with foot ulcer: Secondary | ICD-10-CM | POA: Insufficient documentation

## 2021-04-16 DIAGNOSIS — I10 Essential (primary) hypertension: Secondary | ICD-10-CM | POA: Insufficient documentation

## 2021-04-16 DIAGNOSIS — Z8616 Personal history of COVID-19: Secondary | ICD-10-CM | POA: Insufficient documentation

## 2021-04-16 NOTE — Progress Notes (Signed)
PAREDES XANE, AMSDEN ([id number]) Visit Report for 04/16/2021 Allergy List Details Patient Name: Date of Service: Roy Andrews, [name] 04/16/2021 7:30 A M Medical Record Number: [id number] Patient Account Number: [id number] Date of Birth/Sex: Treating RN: [birth year] (50 y.o. Tammy Sours Primary Care Krystl Wickware: PCP, NO Other Clinician: Referring Jericca Russett: Treating Fabrizzio Marcella/Extender: Tilda Franco in Treatment: 0 Allergies Active Allergies No Known Drug Allergies Allergy Notes Electronic Signature(s) Signed: 04/16/2021 12:23:12 PM By: Shawn Stall RN, BSN Entered By: Shawn Stall on 04/16/2021 07:44:41 -------------------------------------------------------------------------------- Arrival Information Details Patient Name: Date of Service: Roy Andrews, Roy N A. 04/16/2021 7:30 A M Medical Record Number: [id number] Patient Account Number: [id number] Date of Birth/Sex: Treating RN: [birth year] (50 y.o. Tammy Sours Primary Care Justyn Langham: PCP, NO Other Clinician: Referring Nyeem Stoke: Treating Milan Clare/Extender: Tilda Franco in Treatment: 0 Visit Information Patient Arrived: Ambulatory Arrival Time: 07:40 Accompanied By: wife and interpreter Transfer Assistance: None Patient Identification Verified: Yes Secondary Verification Process Completed: Yes Patient Requires Transmission-Based Precautions: No Patient Has Alerts: No Electronic Signature(s) Signed: 04/16/2021 12:23:12 PM By: Shawn Stall RN, BSN Entered By: Shawn Stall on 04/16/2021 07:44:27 -------------------------------------------------------------------------------- Clinic Level of Care Assessment Details Patient Name: Date of Service: Roy Andrews, Roy N A. 04/16/2021 7:30 A M Medical Record Number: [id number] Patient Account Number: [id number] Date of Birth/Sex: Treating RN: 08/16/1970 (50 y.o. Tammy Sours Primary Care Staley Lunz: PCP, NO Other Clinician: Referring  Jessee Mezera: Treating Waylen Depaolo/Extender: Tilda Franco in Treatment: 0 Clinic Level of Care Assessment Items TOOL 4 Quantity Score X- 1 0 Use when only an EandM is performed on FOLLOW-UP visit ASSESSMENTS - Nursing Assessment / Reassessment X- 1 10 Reassessment of Co-morbidities (includes updates in patient status) X- 1 5 Reassessment of Adherence to Treatment Plan ASSESSMENTS - Wound and Skin A ssessment / Reassessment []  - 0 Simple Wound Assessment / Reassessment - one wound []  - 0 Complex Wound Assessment / Reassessment - multiple wounds X- 1 10 Dermatologic / Skin Assessment (not related to wound area) ASSESSMENTS - Focused Assessment X- 1 5 Circumferential Edema Measurements - multi extremities X- 1 10 Nutritional Assessment / Counseling / Intervention []  - 0 Lower Extremity Assessment (monofilament, tuning fork, pulses) []  - 0 Peripheral Arterial Disease Assessment (using hand held doppler) ASSESSMENTS - Ostomy and/or Continence Assessment and Care []  - 0 Incontinence Assessment and Management []  - 0 Ostomy Care Assessment and Management (repouching, etc.) PROCESS - Coordination of Care X - Simple Patient / Family Education for ongoing care 1 15 []  - 0 Complex (extensive) Patient / Family Education for ongoing care X- 1 10 Staff obtains , Records, T Results / Process Orders est []  - 0 Staff telephones HHA, Nursing Homes / Clarify orders / etc []  - 0 Routine Transfer to another Facility (non-emergent condition) []  - 0 Routine Hospital Admission (non-emergent condition) []  - 0 New Admissions / / Ordering NPWT Apligraf, etc. , []  - 0 Emergency Hospital Admission (emergent condition) X- 1 10 Simple Discharge Coordination []  - 0 Complex (extensive) Discharge Coordination PROCESS - Special Needs []  - 0 Pediatric / Minor Patient Management []  - 0 Isolation Patient Management []  - 0 Hearing / Language / Visual special  needs []  - 0 Assessment of Community assistance (transportation, D/C planning, etc.) []  - 0 Additional assistance / Altered mentation []  - 0 Support Surface(s) Assessment (bed, cushion, seat, etc.) INTERVENTIONS - Wound Cleansing / Measurement []  - 0 Simple Wound Cleansing - one wound []  -  0 Complex Wound Cleansing - multiple wounds []  - 0 Wound Imaging (photographs - any number of wounds) []  - 0 Wound Tracing (instead of photographs) []  - 0 Simple Wound Measurement - one wound []  - 0 Complex Wound Measurement - multiple wounds INTERVENTIONS - Wound Dressings []  - 0 Small Wound Dressing one or multiple wounds []  - 0 Medium Wound Dressing one or multiple wounds []  - 0 Large Wound Dressing one or multiple wounds []  - 0 Application of Medications - topical []  - 0 Application of Medications - injection INTERVENTIONS - Miscellaneous []  - 0 External ear exam []  - 0 Specimen Collection (cultures, biopsies, blood, body fluids, etc.) []  - 0 Specimen(s) / Culture(s) sent or taken to Lab for analysis []  - 0 Patient Transfer (multiple staff / / Similar devices) []  - 0 Simple Staple / Suture removal (25 or less) []  - 0 Complex Staple / Suture removal (26 or more) []  - 0 Hypo / Hyperglycemic Management (close monitor of Blood Glucose) X- 1 15 Ankle / Brachial Index (ABI) - do not check if billed separately X- 1 5 Vital Signs Has the patient been seen at the hospital within the last three years: Yes Total Score: 95 Level Of Care: New/Established - Level 3 Electronic Signature(s) Signed: 04/16/2021 12:23:12 PM By: RN, BSN Entered By: on 04/16/2021 08:34:46 -------------------------------------------------------------------------------- Encounter Discharge Information Details Patient Name: Date of Service: Roy Andrews, Roy N A. 04/16/2021 7:30 A M Medical Record Number: Patient Account Number: Date of  Birth/Sex: Treating RN: [birth year] (50 y.o. Primary Care Morocco Gipe: PCP, NO Other Clinician: Referring Quirino Kakos: Treating Lilja Soland/Extender: in Treatment: 0 Encounter Discharge Information Items Discharge Condition: Stable Ambulatory Status: Ambulatory Discharge Destination: Home Transportation: Private Auto Accompanied By: interpreter and wife Schedule Follow-up Appointment: No Clinical Summary of Care: Electronic Signature(s) Signed: 04/16/2021 12:23:12 PM By: Nurse, adult RN, BSN Entered By: on 04/16/2021 08:38:21 -------------------------------------------------------------------------------- Lower Extremity Assessment Details Patient Name: Date of Service: Roy Andrews, Roy N A. 04/16/2021 7:30 A M Medical Record Number: 14/01/2021 Patient Account Number: Shawn Stall Date of Birth/Sex: Treating RN: 08-May-1971 (50 y.o. 14/01/2021 Primary Care Bellatrix Devonshire: PCP, NO Other Clinician: Referring Khyron Garno: Treating Ingrid Shifrin/Extender: [id number] in Treatment: 0 Edema Assessment Assessed: [Left: No] [Right: Yes] Edema: [Left: N] [Right: o] Calf Left: Right: Point of Measurement: 34 cm From Medial Instep 43.9 cm Ankle Left: Right: Point of Measurement: 11 cm From Medial Instep 25.2 cm Knee To Floor Left: Right: From Medial Instep 44 cm Vascular Assessment Pulses: Dorsalis Pedis Palpable: [Right:Yes] Doppler Audible: [Right:Yes] Posterior Tibial Palpable: [Right:Yes] Doppler Audible: [Right:Yes] Blood Pressure: Brachial: [Right:128] Ankle: [Right:Dorsalis Pedis: 130 1.02] Notes mild induration noted to right dorsal foot, no redness or edema. Electronic Signature(s) Signed: 04/16/2021 12:23:12 PM By: 02/24/1971 RN, BSN Entered By: 54 on 04/16/2021 08:32:19 -------------------------------------------------------------------------------- Multi Wound Chart Details Patient Name: Date of  Service: Roy Andrews, Roy N A. 04/16/2021 7:30 A M Medical Record Number: 14/01/2021 Patient Account Number: Shawn Stall Date of Birth/Sex: Treating RN: 10-21-1970 (50 y.o. 14/01/2021 Primary Care Lindamarie Maclachlan: PCP, NO Other Clinician: Referring Merlyn Bollen: Treating Denzel Etienne/Extender: [id number] in Treatment: 0 Vital Signs Height(in): 72 Pulse(bpm): 77 Weight(lbs): 320 Blood Pressure(mmHg): 128/79 Body Mass Index(BMI): 43 Temperature(F): 98.8 Respiratory Rate(breaths/min): 16 Wound Assessments Treatment Notes Electronic Signature(s) Signed: 04/16/2021 9:26:59 AM By: 02/24/1971 DO Signed: 04/16/2021 12:28:25 PM By: Tammy Sours  Legrand Como, BSN Entered By: Geralyn Corwin on 04/16/2021 09:09:50 -------------------------------------------------------------------------------- Multi-Disciplinary Care Plan Details Patient Name: Date of Service: Roy REDES Princella Ion A. 04/16/2021 7:30 A M Medical Record Number: [id number] Patient Account Number: [id number] Date of Birth/Sex: Treating RN: 08-13-1970 (50 y.o. Tammy Sours Primary Care Shanicqua Coldren: PCP, NO Other Clinician: Referring Tayelor Osborne: Treating Ireta Pullman/Extender: Tilda Franco in Treatment: 0 Active Inactive Electronic Signature(s) Signed: 04/16/2021 12:23:12 PM By: Shawn Stall RN, BSN Entered By: Shawn Stall on 04/16/2021 08:32:39 -------------------------------------------------------------------------------- Pain Assessment Details Patient Name: Date of Service: Roy Andrews, Roy N A. 04/16/2021 7:30 A M Medical Record Number: [id number] Patient Account Number: [id number] Date of Birth/Sex: Treating RN: Sep 23, 1970 (50 y.o. Tammy Sours Primary Care Avis Tirone: PCP, NO Other Clinician: Referring Aubriegh Minch: Treating Christine Morton/Extender: Tilda Franco in Treatment: 0 Active Problems Location of Pain Severity and Description of Pain Patient Has Paino No Site  Locations Rate the pain. Current Pain Level: 0 Worst Pain Level: 10 Least Pain Level: 0 Tolerable Pain Level: 8 Character of Pain Describe the Pain: Cramping Pain Management and Medication Current Pain Management: Medication: No Cold Application: No Rest: No Massage: No Activity: No T.E.N.S.: No Heat Application: No Leg drop or elevation: No Is the Current Pain Management Adequate: Adequate How does your wound impact your activities of daily livingo Sleep: No Bathing: No Appetite: No Relationship With Others: No Bladder Continence: No Emotions: No Bowel Continence: No Work: No Toileting: No Drive: No Dressing: No Hobbies: No Notes Per patient pain at times during the day. Electronic Signature(s) Signed: 04/16/2021 12:23:12 PM By: Shawn Stall RN, BSN Entered By: Shawn Stall on 04/16/2021 07:51:37 -------------------------------------------------------------------------------- Patient/Caregiver Education Details Patient Name: Date of Service: Roy Andrews, Roy Gretta Cool 12/9/2022andnbsp7:30 A M Medical Record Number: [id number] Patient Account Number: [id number] Date of Birth/Gender: Treating RN: 1971-03-26 (50 y.o. Tammy Sours Primary Care Physician: PCP, NO Other Clinician: Referring Physician: Treating Physician/Extender: Tilda Franco in Treatment: 0 Education Assessment Education Provided To: Patient Education Topics Provided [facility]: o Handouts: Welcome [facility] , Welcome [facility] Spanish o o Methods: Explain/Verbal, Printed Responses: Reinforcements needed Electronic Signature(s) Signed: 04/16/2021 12:23:12 PM By: Shawn Stall RN, BSN Entered By: Shawn Stall on 04/16/2021 08:32:51 -------------------------------------------------------------------------------- Vitals Details Patient Name: Date of Service: Roy Andrews, Roy N A. 04/16/2021 7:30 A M Medical Record Number:  [id number] Patient Account Number: [id number] Date of Birth/Sex: Treating RN: 18-Feb-1971 (50 y.o. Tammy Sours Primary Care Yohan Samons: PCP, NO Other Clinician: Referring Davone Shinault: Treating Alin Hutchins/Extender: Tilda Franco in Treatment: 0 Vital Signs Time Taken: 07:40 Temperature (F): 98.8 Height (in): 72 Pulse (bpm): 77 Source: Stated Respiratory Rate (breaths/min): 16 Weight (lbs): 320 Blood Pressure (mmHg): 128/79 Source: Stated Reference Range: 80 - 120 mg / dl Body Mass Index (BMI): 43.4 Electronic Signature(s) Signed: 04/16/2021 12:23:12 PM By: Shawn Stall RN, BSN Entered By: Shawn Stall on 04/16/2021 07:47:52

## 2021-04-16 NOTE — Progress Notes (Signed)
Roy Andrews, [name] ([id number]) Visit Report for 04/16/2021 Chief Complaint Document Details Patient Name: Date of Service: PA REDES Roy Andrews, Roy Lemon A. 04/16/2021 7:30 A M Medical Record Number: [id number] Patient Account Number: [id number] Date of Birth/Sex: Treating RN: December 11, 1970 (50 y.o. [name] Primary Care Provider: PCP, NO Other Clinician: Referring Provider: Treating Provider/Extender: Yaakov Guthrie in Treatment: 0 Information Obtained from: Patient Chief Complaint history of cellulitis and abscess to the right foot s/p IandD Electronic Signature(s) Signed: 04/16/2021 9:26:59 AM By: Kalman Shan DO Entered By: Kalman Shan on 04/16/2021 09:10:38 -------------------------------------------------------------------------------- HPI Details Patient Name: Date of Service: PA REDES Roy Andrews, Roy N A. 04/16/2021 7:30 A M Medical Record Number: [id number] Patient Account Number: [id number] Date of Birth/Sex: Treating RN: July 03, 1970 (50 y.o. [name] Primary Care Provider: PCP, NO Other Clinician: Referring Provider: Treating Provider/Extender: Yaakov Guthrie in Treatment: 0 History of Present Illness HPI Description: [name] is a 50 year old Spanish-speaking male that presents the clinic for follow-up from the ED for a history of right foot cellulitis with abscess status post IandD on 03/15/2021. He has been using an ointment daily to the wound. It is unclear exactly what the product is. He was on Bactrim for 10 days following the IandD and has completed this course. He states that over the past month the site of the IandD has healed. He currently denies swelling, drainage or other signs of infection. Electronic Signature(s) Signed: 04/16/2021 9:26:59 AM By: Kalman Shan DO Entered By: Kalman Shan on 04/16/2021 09:22:27 -------------------------------------------------------------------------------- Physical Exam  Details Patient Name: Date of Service: PA REDES Roy Andrews, Roy N A. 04/16/2021 7:30 A M Medical Record Number: [id number] Patient Account Number: [id number] Date of Birth/Sex: Treating RN: 03/10/1971 (50 y.o. [name] Primary Care Provider: PCP, NO Other Clinician: Referring Provider: Treating Provider/Extender: Yaakov Guthrie in Treatment: 0 Constitutional respirations regular, non-labored and within target range for patient.. Cardiovascular 2+ dorsalis pedis/posterior tibialis pulses. Psychiatric pleasant and cooperative. Notes Right foot: Small scab over previous IandD site. No swelling noted. No induration noted. No drainage on palpation. No signs of infection. Electronic Signature(s) Signed: 04/16/2021 9:26:59 AM By: Kalman Shan DO Entered By: Kalman Shan on 04/16/2021 09:23:31 -------------------------------------------------------------------------------- Physician Orders Details Patient Name: Date of Service: PA REDES Roy Andrews, Roy N A. 04/16/2021 7:30 A M Medical Record Number: [id number] Patient Account Number: [id number] Date of Birth/Sex: Treating RN: [birth year] (50 y.o. [name] Primary Care Provider: PCP, NO Other Clinician: Referring Provider: Treating Provider/Extender: Yaakov Guthrie in Treatment: 0 Verbal / Phone Orders: No Diagnosis Coding Discharge From [facility] Discharge from [facility] - Call if any future wound care needs. Closely monitor feet daily No barefoot walking anytime. Wear shoes for protection of feet. lotion feet and legs every night. Patient to call [facility] next week to see if they received the referral #[phone] [facility] Primary Care Referral - Referral to [facility] primary care provider Dr. Kathlene Andrews patient needs a PCP need following for diabetes and hypertension. Electronic Signature(s) Signed: 04/16/2021 9:26:59 AM By: Kalman Shan DO Entered By: Kalman Shan on 04/16/2021 09:23:42 Prescription 04/16/2021 -------------------------------------------------------------------------------- Roy Roy Andrews, Roy A. Kalman Shan DO Patient Name: Provider: 03-07-1971 [id number] Date of Birth: NPI#: Roy [id number] Sex: DEA #: [phone] Phone #: License #: [place] Patient Address: [place] [address] [place], [place] [address] [place], [place] [address] [phone]  Allergies No Known Drug Allergies Provider's Orders Primary Care Referral - Referral to [facility] primary care provider Dr. Kathlene Andrews patient needs a PCP need following for diabetes and hypertension. Hand Signature: Date(s): Electronic Signature(s) Signed: 04/16/2021 9:26:59 AM By: Kalman Shan DO Entered By: Kalman Shan on 04/16/2021 09:23:42 -------------------------------------------------------------------------------- Problem List Details Patient Name: Date of Service: PA REDES Roy Andrews, Roy N A. 04/16/2021 7:30 A M Medical Record Number: [id number] Patient Account Number: [id number] Date of Birth/Sex: Treating RN: 1971/04/26 (50 y.o. [name] Primary Care Provider: PCP, NO Other Clinician: Referring Provider: Treating Provider/Extender: Yaakov Guthrie in Treatment: 0 Active Problems ICD-10 Encounter Code Description Active Date MDM Diagnosis E11.621 Type 2 diabetes mellitus with foot ulcer 04/16/2021 No Yes I10 Essential (primary) hypertension 04/16/2021 No Yes Inactive Problems Resolved Problems Electronic Signature(s) Signed: 04/16/2021 9:26:59 AM By: Kalman Shan DO Entered By: Kalman Shan on 04/16/2021 09:09:22 -------------------------------------------------------------------------------- Progress Note Details Patient Name: Date of Service: PA REDES Roy Andrews, Roy N A. 04/16/2021 7:30 A M Medical Record Number: [id number] Patient Account Number:  [id number] Date of Birth/Sex: Treating RN: Feb 01, 1971 (50 y.o. [name] Primary Care Provider: PCP, NO Other Clinician: Referring Provider: Treating Provider/Extender: Yaakov Guthrie in Treatment: 0 Subjective Chief Complaint Information obtained from Patient history of cellulitis and abscess to the right foot s/p IandD History of Present Illness (HPI) [name] is a 50 year old Spanish-speaking male that presents the clinic for follow-up from the ED for a history of right foot cellulitis with abscess status post IandD on 03/15/2021. He has been using an ointment daily to the wound. It is unclear exactly what the product is. He was on Bactrim for 10 days following the IandD and has completed this course. He states that over the past month the site of the IandD has healed. He currently denies swelling, drainage or other signs of infection. Patient History Information obtained from Patient, Chart. Allergies No Known Drug Allergies Family History Cancer - Maternal Grandparents, Diabetes - Mother, No family history of Heart Disease, Hereditary Spherocytosis, Hypertension, Kidney Disease, Lung Disease, Seizures, Stroke, Thyroid Problems, Tuberculosis. Social History Never smoker, Marital Status - Married, Alcohol Use - Never, Drug Use - No History, Caffeine Use - Never. Medical History Eyes Denies history of Cataracts, Glaucoma, Optic Neuritis Ear/Nose/Mouth/Throat Denies history of Chronic sinus problems/congestion, Middle ear problems Hematologic/Lymphatic Denies history of Anemia, Hemophilia, Human Immunodeficiency Virus, Lymphedema, Sickle Cell Disease Respiratory Denies history of Aspiration, Asthma, Chronic Obstructive Pulmonary Disease (COPD), Pneumothorax, Sleep Apnea, Tuberculosis Cardiovascular Patient has history of Hypertension Denies history of Angina, Arrhythmia, Congestive Heart Failure, Coronary Artery Disease, Deep Vein Thrombosis,  Hypotension, Myocardial Infarction, Peripheral Arterial Disease, Peripheral Venous Disease, Phlebitis, Vasculitis Gastrointestinal Denies history of Cirrhosis , Colitis, Crohnoos, Hepatitis A, Hepatitis B, Hepatitis C Endocrine Patient has history of Type II Diabetes Denies history of Type I Diabetes Genitourinary Denies history of End Stage Renal Disease Immunological Denies history of Lupus Erythematosus, Raynaudoos, Scleroderma Integumentary (Skin) Denies history of History of Burn Musculoskeletal Denies history of Gout, Rheumatoid Arthritis, Osteoarthritis, Osteomyelitis Neurologic Denies history of Dementia, Neuropathy, Quadriplegia, Paraplegia, Seizure Disorder Oncologic Denies history of Received Chemotherapy, Received Radiation Psychiatric Denies history of Anorexia/bulimia, Confinement Anxiety Patient is treated with Oral Agents. Blood sugar is tested. Hospitalization/Surgery History - COVID 2020 intubated x10 days. - 50 years old surgery to remove pipe from right leg.. Review of Systems (ROS) Eyes Denies complaints or symptoms of Dry Eyes, Vision Changes, Glasses / Contacts. Ear/Nose/Mouth/Throat Denies complaints or  symptoms of Chronic sinus problems or rhinitis. Respiratory Denies complaints or symptoms of Chronic or frequent coughs, Shortness of Breath. Cardiovascular Denies complaints or symptoms of Chest pain. Gastrointestinal Denies complaints or symptoms of Frequent diarrhea, Nausea, Vomiting. Endocrine Denies complaints or symptoms of Heat/cold intolerance. Genitourinary Denies complaints or symptoms of Frequent urination. Integumentary (Skin) Complains or has symptoms of Wounds - right dorsal foot. Musculoskeletal Denies complaints or symptoms of Muscle Pain, Muscle Weakness. Neurologic Denies complaints or symptoms of Numbness/parasthesias. Psychiatric Denies complaints or symptoms of Claustrophobia, Suicidal. Objective Constitutional respirations  regular, non-labored and within target range for patient.. Vitals Time Taken: 7:40 AM, Height: 72 in, Source: Stated, Weight: 320 lbs, Source: Stated, BMI: 43.4, Temperature: 98.8 F, Pulse: 77 bpm, Respiratory Rate: 16 breaths/min, Blood Pressure: 128/79 mmHg. Cardiovascular 2+ dorsalis pedis/posterior tibialis pulses. Psychiatric pleasant and cooperative. General Notes: Right foot: Small scab over previous IandD site. No swelling noted. No induration noted. No drainage on palpation. No signs of infection. Assessment Active Problems ICD-10 Type 2 diabetes mellitus with foot ulcer Essential (primary) hypertension Patient presents with a healed wound to the right foot status post IandD for an abscess done in the ED on 03/15/2021. There were no concerning features on exam. There is a very small scab present to the dorsal aspect. I recommended that he follow-up in our clinic as needed. He has a history of diabetes and hypertension and is receiving his meds through the ED. He does not have a primary care physician and we made a referral to Dr. Porfirio Oar. He also states he is going to look into where his mother goes for care. Plan Discharge From [facility] Services: [facility] - Call if any future wound care needs. Closely monitor feet daily No barefoot walking anytime. Wear shoes for protection of feet. lotion feet and legs every night. Patient to call [facility] Dr.Paz next week to see if they received the referral #[phone] ordered were: Primary Care Referral - Referral to  primary care provider Dr. Willow Ora patient needs a PCP need following for diabetes and hypertension. 1. Follow-up as needed 2. Referral to [facility] primary care for Dr. Porfirio Oar Electronic Signature(s) Signed: 04/16/2021 9:26:59 AM By: Geralyn Corwin DO Entered By: Geralyn Corwin on 04/16/2021  09:26:18 -------------------------------------------------------------------------------- HxROS Details Patient Name: Date of Service: PA REDES Roy Andrews, Roy N A. 04/16/2021 7:30 A M Medical Record Number: [id number] Patient Account Number: [id number] Date of Birth/Sex: Treating RN: 18-Feb-1971 (50 y.o. Tammy Sours Primary Care Provider: PCP, NO Other Clinician: Referring Provider: Treating Provider/Extender: Tilda Franco in Treatment: 0 Information Obtained From Patient Chart Eyes Complaints and Symptoms: Negative for: Dry Eyes; Vision Changes; Glasses / Contacts Medical History: Negative for: Cataracts; Glaucoma; Optic Neuritis Ear/Nose/Mouth/Throat Complaints and Symptoms: Negative for: Chronic sinus problems or rhinitis Medical History: Negative for: Chronic sinus problems/congestion; Middle ear problems Respiratory Complaints and Symptoms: Negative for: Chronic or frequent coughs; Shortness of Breath Medical History: Negative for: Aspiration; Asthma; Chronic Obstructive Pulmonary Disease (COPD); Pneumothorax; Sleep Apnea; Tuberculosis Cardiovascular Complaints and Symptoms: Negative for: Chest pain Medical History: Positive for: Hypertension Negative for: Angina; Arrhythmia; Congestive Heart Failure; Coronary Artery Disease; Deep Vein Thrombosis; Hypotension; Myocardial Infarction; Peripheral Arterial Disease; Peripheral Venous Disease; Phlebitis; Vasculitis Gastrointestinal Complaints and Symptoms: Negative for: Frequent diarrhea; Nausea; Vomiting Medical History: Negative for: Cirrhosis ; Colitis; Crohns; Hepatitis A; Hepatitis B; Hepatitis C Endocrine Complaints and Symptoms: Negative for: Heat/cold intolerance Medical History: Positive for: Type II Diabetes Negative for:  Type I Diabetes Time with diabetes: 9 years Treated with: Oral agents Blood sugar tested every day: Yes Tested : daily Genitourinary Complaints and Symptoms: Negative for:  Frequent urination Medical History: Negative for: End Stage Renal Disease Integumentary (Skin) Complaints and Symptoms: Positive for: Wounds - right dorsal foot Medical History: Negative for: History of Burn Musculoskeletal Complaints and Symptoms: Negative for: Muscle Pain; Muscle Weakness Medical History: Negative for: Gout; Rheumatoid Arthritis; Osteoarthritis; Osteomyelitis Neurologic Complaints and Symptoms: Negative for: Numbness/parasthesias Medical History: Negative for: Dementia; Neuropathy; Quadriplegia; Paraplegia; Seizure Disorder Psychiatric Complaints and Symptoms: Negative for: Claustrophobia; Suicidal Medical History: Negative for: Anorexia/bulimia; Confinement Anxiety Hematologic/Lymphatic Medical History: Negative for: Anemia; Hemophilia; Human Immunodeficiency Virus; Lymphedema; Sickle Cell Disease Immunological Medical History: Negative for: Lupus Erythematosus; Raynauds; Scleroderma Oncologic Medical History: Negative for: Received Chemotherapy; Received Radiation Immunizations Pneumococcal Vaccine: Received Pneumococcal Vaccination: No Implantable Devices No devices added Hospitalization / Surgery History Type of Hospitalization/Surgery COVID 2020 intubated x61 days 50 years old surgery to remove pipe from right leg. Family and Social History Cancer: Yes - Maternal Grandparents; Diabetes: Yes - Mother; Heart Disease: No; Hereditary Spherocytosis: No; Hypertension: No; Kidney Disease: No; Lung Disease: No; Seizures: No; Stroke: No; Thyroid Problems: No; Tuberculosis: No; Never smoker; Marital Status - Married; Alcohol Use: Never; Drug Use: No History; Caffeine Use: Never; Financial Concerns: No; Food, Clothing or Shelter Needs: No; Support System Lacking: No; Transportation Concerns: No Electronic Signature(s) Signed: 04/16/2021 9:26:59 AM By: Kalman Shan DO Signed: 04/16/2021 12:23:12 PM By: Deon Pilling RN, BSN Entered By: Deon Pilling on  04/16/2021 07:56:45 -------------------------------------------------------------------------------- SuperBill Details Patient Name: Date of Service: PA REDES Roy Andrews, Roy N A. 04/16/2021 Medical Record Number: [id number] Patient Account Number: [id number] Date of Birth/Sex: Treating RN: 30-Oct-1970 (50 y.o. [name] Primary Care Provider: PCP, NO Other Clinician: Referring Provider: Treating Provider/Extender: Yaakov Guthrie in Treatment: 0 Diagnosis Coding ICD-10 Codes Code Description E11.621 Type 2 diabetes mellitus with foot ulcer I10 Essential (primary) hypertension Facility Procedures CPT4 Code: [id number] Description: 99213 - WOUND CARE VISIT-LEV 3 EST PT Modifier: Quantity: 1 Physician Procedures : CPT4 Code Description Modifier [id number] [facility] PHYS LEVEL 3 NEW PT ICD-10 Diagnosis Description E11.621 Type 2 diabetes mellitus with foot ulcer I10 Essential (primary) hypertension Quantity: 1 Electronic Signature(s) Signed: 04/16/2021 9:26:59 AM By: Kalman Shan DO Entered By: Kalman Shan on 04/16/2021 09:26:36

## 2021-04-16 NOTE — Progress Notes (Signed)
Roy Andrews, Roy Andrews ([id number]) Visit Report for 04/16/2021 Abuse/Suicide Risk Screen Details Patient Name: Date of Service: Roy Andrews, Roy Dolin A. 04/16/2021 7:30 A M Medical Record Number: [id number] Patient Account Number: [id number] Date of Birth/Sex: Treating RN: 1970/12/07 (50 y.o. Roy Andrews Primary Care Roy Andrews: PCP, NO Other Clinician: Referring Roy Andrews: Treating Roy Andrews/Extender: Roy Andrews in Treatment: 0 Abuse/Suicide Risk Screen Items Answer ABUSE RISK SCREEN: Has anyone close to you tried to hurt or harm you recentlyo No Do you feel uncomfortable with anyone in your familyo No Has anyone forced you do things that you didnt want to doo No Electronic Signature(s) Signed: 04/16/2021 12:23:12 PM By: Roy Stall RN, BSN Entered By: Roy Andrews on 04/16/2021 07:45:04 -------------------------------------------------------------------------------- Activities of Daily Living Details Patient Name: Date of Service: Roy Andrews, Roy Dolin A. 04/16/2021 7:30 A M Medical Record Number: [id number] Patient Account Number: [id number] Date of Birth/Sex: Treating RN: [birth year] (50 y.o. Roy Andrews Primary Care Roy Andrews: PCP, NO Other Clinician: Referring Roy Andrews: Treating Roy Andrews/Extender: Roy Andrews in Treatment: 0 Activities of Daily Living Items Answer Activities of Daily Living (Please select one for each item) Drive Automobile Not Able T Medications ake Completely Able Use T elephone Completely Able Care for Appearance Completely Able Use T oilet Completely Able Bath / Shower Completely Able Dress Self Completely Able Feed Self Completely Able Walk Completely Able Get In / Out Bed Completely Able Housework Completely Able Prepare Meals Completely Able Handle Money Completely Able Shop for Self Completely Able Electronic Signature(s) Signed: 04/16/2021 12:23:12 PM By: Roy Stall RN, BSN Entered By: Roy Andrews on  04/16/2021 07:46:42 -------------------------------------------------------------------------------- Education Screening Details Patient Name: Date of Service: Roy Andrews, Roy N A. 04/16/2021 7:30 A M Medical Record Number: [id number] Patient Account Number: [id number] Date of Birth/Sex: Treating RN: 08-26-1970 (50 y.o. Roy Andrews Primary Care Roy Andrews: PCP, NO Other Clinician: Referring Roy Andrews: Treating Roy Andrews/Extender: Roy Andrews in Treatment: 0 Primary Learner Assessed: Patient Learning Preferences/Education Level/Primary Language Learning Preference: Explanation, Demonstration, Printed Material Highest Education Level: College or Above Cognitive Barrier Language Barrier: No Translator Needed: No Memory Deficit: No Emotional Barrier: No Cultural/Religious Beliefs Affecting Medical Care: No Physical Barrier Impaired Vision: No Impaired Hearing: No Decreased Hand dexterity: No Knowledge/Comprehension Knowledge Level: High Comprehension Level: High Ability to understand written instructions: High Ability to understand verbal instructions: High Motivation Anxiety Level: Calm Cooperation: Cooperative Education Importance: Acknowledges Need Interest in Health Problems: Asks Questions Perception: Coherent Willingness to Engage in Self-Management High Activities: Readiness to Engage in Self-Management High Activities: Electronic Signature(s) Signed: 04/16/2021 12:23:12 PM By: Roy Stall RN, BSN Entered By: Roy Andrews on 04/16/2021 07:49:12 -------------------------------------------------------------------------------- Fall Risk Assessment Details Patient Name: Date of Service: Roy Andrews, Roy N A. 04/16/2021 7:30 A M Medical Record Number: [id number] Patient Account Number: [id number] Date of Birth/Sex: Treating RN: 1970-06-11 (50 y.o. Roy Andrews Primary Care Roy Andrews: PCP, NO Other Clinician: Referring Roy Andrews: Treating  Roy Andrews/Extender: Roy Andrews in Treatment: 0 Fall Risk Assessment Items Have you had 2 or more falls in the last 12 monthso 0 No Have you had any fall that resulted in injury in the last 12 monthso 0 No FALLS RISK SCREEN History of falling - immediate or within 3 months 0 No Secondary diagnosis (Do you have 2 or more medical diagnoseso) 0 No Ambulatory aid None/bed rest/wheelchair/nurse 0 Yes Crutches/cane/walker 0 No Furniture 0 No Intravenous therapy Access/Saline/Heparin Lock 0 No Gait/Transferring Normal/ bed rest/  wheelchair 0 Yes Weak (short steps with or without shuffle, stooped but able to lift head while walking, may seek 0 No support from furniture) Impaired (short steps with shuffle, may have difficulty arising from chair, head down, impaired 0 No balance) Mental Status Oriented to own ability 0 Yes Electronic Signature(s) Signed: 04/16/2021 12:23:12 PM By: Roy Stall RN, BSN Entered By: Roy Andrews on 04/16/2021 07:49:44 -------------------------------------------------------------------------------- Foot Assessment Details Patient Name: Date of Service: Roy Andrews, Roy N A. 04/16/2021 7:30 A M Medical Record Number: [id number] Patient Account Number: [id number] Date of Birth/Sex: Treating RN: December 22, 1970 (50 y.o. Roy Andrews Primary Care Roy Andrews: PCP, NO Other Clinician: Referring Roy Andrews: Treating Roy Andrews/Extender: Roy Andrews in Treatment: 0 Foot Assessment Items Site Locations + = Sensation present, - = Sensation absent, C = Callus, U = Ulcer R = Redness, W = Warmth, M = Maceration, PU = Pre-ulcerative lesion F = Fissure, S = Swelling, D = Dryness Assessment Right: Left: Other Deformity: No No Prior Foot Ulcer: No No Prior Amputation: No No Charcot Joint: No No Ambulatory Status: Ambulatory Without Help Gait: Steady Electronic Signature(s) Signed: 04/16/2021 12:23:12 PM By: Roy Stall RN, BSN Entered By:  Roy Andrews on 04/16/2021 07:58:56 -------------------------------------------------------------------------------- Nutrition Risk Screening Details Patient Name: Date of Service: Roy Andrews, Roy N A. 04/16/2021 7:30 A M Medical Record Number: [id number] Patient Account Number: [id number] Date of Birth/Sex: Treating RN: 02/23/1971 (50 y.o. Roy Andrews Primary Care Anupama Piehl: PCP, NO Other Clinician: Referring Gaspard Isbell: Treating Dimonique Bourdeau/Extender: Roy Andrews in Treatment: 0 Height (in): 72 Weight (lbs): 320 Body Mass Index (BMI): 43.4 Nutrition Risk Screening Items Score Screening NUTRITION RISK SCREEN: I have an illness or condition that made me change the kind and/or amount of food I eat 2 Yes I eat fewer than two meals per day 0 No I eat few fruits and vegetables, or milk products 0 No I have three or more drinks of beer, liquor or wine almost every day 0 No I have tooth or mouth problems that make it hard for me to eat 0 No I don't always have enough money to buy the food I need 0 No I eat alone most of the time 0 No I take three or more different prescribed or over-the-counter drugs a day 1 Yes Without wanting to, I have lost or gained 10 pounds in the last six months 0 No I am not always physically able to shop, cook and/or feed myself 0 No Nutrition Protocols Good Risk Protocol Provide education on elevated blood Moderate Risk Protocol 0 sugars and impact on wound healing, as applicable High Risk Proctocol Risk Level: Moderate Risk Score: 3 Electronic Signature(s) Signed: 04/16/2021 12:23:12 PM By: Roy Stall RN, BSN Entered By: Roy Andrews on 04/16/2021 07:49:50

## 2021-09-21 ENCOUNTER — Ambulatory Visit: Payer: Self-pay | Admitting: Internal Medicine

## 2021-09-28 ENCOUNTER — Ambulatory Visit: Payer: Self-pay | Admitting: Internal Medicine

## 2021-09-28 ENCOUNTER — Encounter: Payer: Self-pay | Admitting: Internal Medicine

## 2021-09-28 VITALS — BP 142/90 | HR 76 | Resp 12 | Ht 69.75 in

## 2021-09-28 DIAGNOSIS — D72829 Elevated white blood cell count, unspecified: Secondary | ICD-10-CM

## 2021-09-28 DIAGNOSIS — Z6841 Body Mass Index (BMI) 40.0 and over, adult: Secondary | ICD-10-CM

## 2021-09-28 DIAGNOSIS — E1165 Type 2 diabetes mellitus with hyperglycemia: Secondary | ICD-10-CM

## 2021-09-28 DIAGNOSIS — E66813 Obesity, class 3: Secondary | ICD-10-CM

## 2021-09-28 DIAGNOSIS — Z79899 Other long term (current) drug therapy: Secondary | ICD-10-CM

## 2021-09-28 DIAGNOSIS — I10 Essential (primary) hypertension: Secondary | ICD-10-CM

## 2021-09-28 DIAGNOSIS — E010 Iodine-deficiency related diffuse (endemic) goiter: Secondary | ICD-10-CM

## 2021-09-28 DIAGNOSIS — R7309 Other abnormal glucose: Secondary | ICD-10-CM

## 2021-09-28 LAB — POCT GLUCOSE (DEVICE FOR HOME USE): Glucose Fasting, POC: 404 mg/dL — AB (ref 70–99)

## 2021-09-28 MED ORDER — METFORMIN HCL ER 500 MG PO TB24: ORAL_TABLET | ORAL | 11 refills | Status: DC

## 2021-09-28 MED ORDER — LISINOPRIL 20 MG PO TABS: 20.0000 mg | ORAL_TABLET | Freq: Every day | ORAL | 11 refills | Status: DC

## 2021-09-28 MED ORDER — AGAMATRIX PRESTO TEST VI STRP: ORAL_STRIP | 11 refills | Status: DC

## 2021-09-28 MED ORDER — AGAMATRIX ULTRA-THIN LANCETS MISC: 11 refills | Status: DC

## 2021-09-28 MED ORDER — GLIPIZIDE 5 MG PO TABS: ORAL_TABLET | ORAL | 11 refills | Status: DC

## 2021-09-28 MED ORDER — AGAMATRIX PRESTO W/DEVICE KIT: PACK | 0 refills | Status: AC

## 2021-09-28 NOTE — Progress Notes (Signed)
Subjective:    Patient ID: Roy Andrews, male   DOB: [birth year], 51 y.o.   MRN: [id number]   HPI  Here to establish Very difficult history  Estefania Geanie Berlin interpreting   DM:  diagnosed 9 years ago.  Ran out of meds 3 weeks ago.  Metformin 850 mg twice daily.  Also took a small pill once daily.  Filled at [facility].   Clarifies he took meds after initial diagnosis for only 1 year and then when hospitalized for COVID, which included insulin.  He was on and off meds after several months until seen in ED for foot infection in 03/2021 at which time he was started on Novolog mix and Metformin.    He never filled the insulin--has not taken since 2020.   Did not get set up with primary care after the foot infection treatment completed.   Last A1C in chart from 10/11/2018 measuring 10.0%.  2.  Hypertension:  noted per patient first with his foot infection last November.    3.  Obesity:  Weight issue for 23 years.  Wife cooks most of meals, but he eats out a lot--grabs stuff here and there.  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  First eats: 9 a.m. [employer] breakfast:  on days he is working (2 times weekly max):  chicken biscuit, soda.  If not going to work:  Green smoothie at 8 a.m.:  celery, spinach, nopales, aloe vera, pineapple, orange and water.  2-3 times weekly   3-4 sodas daily.Coke or Pepsi.  Does not get headache if stops suddenly--did so for 6 months after foot infection.    4.  Complications of COVID:  Numbness of left leg.  Pain in left shoulder when tries to abduct or flex.  Generalized fatigue, dyspnea, but not sure how much is due to his weight.  States has not gained much weight since COVID illness. Later states this all started after COVID.       Current Meds  Medication Sig   blood glucose meter kit and supplies Dispense based on patient and insurance  preference. Use up to four times daily as directed. (FOR ICD-10 E10.9, E11.9).   calcium carbonate (TUMS - DOSED IN MG ELEMENTAL CALCIUM) 500 MG chewable tablet Chew 1 tablet by mouth as needed for indigestion or heartburn.   ibuprofen (ADVIL) 600 MG tablet Take 1 tablet (600 mg total) by mouth every 8 (eight) hours as needed.   No Known Allergies  Past Medical History:  Diagnosis Date   Diabetes mellitus without complication (HCC)    Dyslipidemia, goal LDL below 70    Hypertension    Obese    Past Surgical History:  Procedure Laterality Date   NO PAST SURGERIES     right lower leg debridement Right    large puncture wound from sharp rusted metal object at 51 years of age   Family History  Problem Relation Age of Onset   Diabetes Mother    Glucose-6-phos deficiency Daughter         Review of Systems    Objective:   BP (!) 142/90 (BP Location: Right Arm, Patient Position: Sitting, Cuff Size: Normal)   Pulse 76   Resp 12   Ht 5' 9.75" (1.772 m)   BMI 46.24 kg/m   Physical Exam Obese, NAD HEENT:  PERRL EOMI, TMs pearly gray, throat without injection  Neck:  Supple, No adenopathy, + thyromegaly Chest:  CTA CV:  RRR with normal S1 and S2, No S3, S4 or murmur.  No carotid  bruits.  Carotid, radial and DP pulses normal and equal Abd:  S, NT, No HSM or mass, + BS LE:  no edema   Assessment & Plan    DM:  Discussed dietary improvements at length and ultimately getting started with daily physical activity.  Rx for glucometer and supplies to check glucose twice daily.   Metformin XR 500 mg 2 tabs twice daily and Glipizide 5 mg twice daily. A1C  2.   Hypertension:  Lisinopril 20 mg daily.  CMP, CBC  3.  History of elevated WBCs:  CBC  4.  Thyromegaly:  TSH.    5.  Hx dyslipidemia:  Lipid profile.

## 2021-09-29 LAB — COMPREHENSIVE METABOLIC PANEL
ALT: 14 IU/L (ref 0–44)
AST: 9 IU/L (ref 0–40)
Albumin/Globulin Ratio: 1.2 (ref 1.2–2.2)
Albumin: 4 g/dL (ref 4.0–5.0)
Alkaline Phosphatase: 66 IU/L (ref 44–121)
BUN/Creatinine Ratio: 17 (ref 9–20)
BUN: 16 mg/dL (ref 6–24)
Bilirubin Total: 0.3 mg/dL (ref 0.0–1.2)
CO2: 23 mmol/L (ref 20–29)
Calcium: 8.7 mg/dL (ref 8.7–10.2)
Chloride: 96 mmol/L (ref 96–106)
Creatinine, Ser: 0.95 mg/dL (ref 0.76–1.27)
Globulin, Total: 3.3 g/dL (ref 1.5–4.5)
Glucose: 399 mg/dL — ABNORMAL HIGH (ref 70–99)
Potassium: 4.8 mmol/L (ref 3.5–5.2)
Sodium: 133 mmol/L — ABNORMAL LOW (ref 134–144)
Total Protein: 7.3 g/dL (ref 6.0–8.5)
eGFR: 98 mL/min/{1.73_m2} (ref >59)

## 2021-09-29 LAB — CBC WITH DIFFERENTIAL/PLATELET
Basophils Absolute: 0.1 10*3/uL (ref 0.0–0.2)
Basos: 1 %
EOS (ABSOLUTE): 0.2 10*3/uL (ref 0.0–0.4)
Eos: 3 %
Hematocrit: 46.8 % (ref 37.5–51.0)
Hemoglobin: 15.6 g/dL (ref 13.0–17.7)
Immature Grans (Abs): 0 10*3/uL (ref 0.0–0.1)
Immature Granulocytes: 1 %
Lymphocytes Absolute: 3 10*3/uL (ref 0.7–3.1)
Lymphs: 37 %
MCH: 29.8 pg (ref 26.6–33.0)
MCHC: 33.3 g/dL (ref 31.5–35.7)
MCV: 90 fL (ref 79–97)
Monocytes Absolute: 0.7 10*3/uL (ref 0.1–0.9)
Monocytes: 9 %
Neutrophils Absolute: 4 10*3/uL (ref 1.4–7.0)
Neutrophils: 49 %
Platelets: 182 10*3/uL (ref 150–450)
RBC: 5.23 x10E6/uL (ref 4.14–5.80)
RDW: 12.7 % (ref 11.6–15.4)
WBC: 8 10*3/uL (ref 3.4–10.8)

## 2021-09-29 LAB — LIPID PANEL W/O CHOL/HDL RATIO
Cholesterol, Total: 184 mg/dL (ref 100–199)
HDL: 28 mg/dL — ABNORMAL LOW (ref >39)
LDL Chol Calc (NIH): 99 mg/dL (ref 0–99)
Triglycerides: 337 mg/dL — ABNORMAL HIGH (ref 0–149)
VLDL Cholesterol Cal: 57 mg/dL — ABNORMAL HIGH (ref 5–40)

## 2021-09-29 LAB — TSH: TSH: 1.4 u[IU]/mL (ref 0.450–4.500)

## 2021-09-29 LAB — HGB A1C W/O EAG: Hgb A1c MFr Bld: 11.1 % — ABNORMAL HIGH (ref 4.8–5.6)

## 2021-11-03 ENCOUNTER — Encounter: Payer: Self-pay | Admitting: Internal Medicine

## 2021-11-03 ENCOUNTER — Ambulatory Visit: Payer: Self-pay | Admitting: Internal Medicine

## 2021-11-03 VITALS — BP 130/80 | HR 80 | Resp 16 | Ht 69.75 in | Wt 367.0 lb

## 2021-11-03 DIAGNOSIS — E785 Hyperlipidemia, unspecified: Secondary | ICD-10-CM

## 2021-11-03 DIAGNOSIS — E66813 Obesity, class 3: Secondary | ICD-10-CM

## 2021-11-03 DIAGNOSIS — Z5941 Food insecurity: Secondary | ICD-10-CM

## 2021-11-03 DIAGNOSIS — E1169 Type 2 diabetes mellitus with other specified complication: Secondary | ICD-10-CM

## 2021-11-03 DIAGNOSIS — E1165 Type 2 diabetes mellitus with hyperglycemia: Secondary | ICD-10-CM

## 2021-11-03 DIAGNOSIS — I10 Essential (primary) hypertension: Secondary | ICD-10-CM

## 2021-11-03 DIAGNOSIS — Z6841 Body Mass Index (BMI) 40.0 and over, adult: Secondary | ICD-10-CM

## 2021-11-03 DIAGNOSIS — Z5986 Financial insecurity: Secondary | ICD-10-CM

## 2021-11-03 MED ORDER — ATORVASTATIN CALCIUM 20 MG PO TABS: ORAL_TABLET | ORAL | 11 refills | Status: DC

## 2021-11-03 MED ORDER — GLIPIZIDE 10 MG PO TABS: ORAL_TABLET | ORAL | 11 refills | Status: DC

## 2021-11-03 MED ORDER — LORATADINE 10 MG PO TABS: 10.0000 mg | ORAL_TABLET | Freq: Every day | ORAL | 11 refills | Status: DC

## 2021-11-03 NOTE — Progress Notes (Signed)
Subjective:    Patient ID: Roy Andrews, male   DOB: 09/02/1970, 51 y.o.   MRN: [id number]   HPI  [name] interprets   Problems swallowing saliva recently--past 2 months.  Can choke on saliva.  Has a deep voice, but cannot say if is hoarse.  Feels his throat is dry.  Is having a runny nose, but just since he place facial mask for being in office currently.  Symptoms are all day long.  Does not feel he is coughing with this.   Does have itching in throat and is clearing throat.  No runny nose, sneezing or watery eyes.   Has had acid reflux, but really only when sugars out of control, which he does not feel is an issue now.    2.  Hypertension:  was taking medication, but ran out about 4 days..  No problems with meds.  3.  DM:  A1C in May 11.1% as expected, was high.  He is not checking sugars this past week.  Ran out of glipizide yesterday and has 2 more days of Metformin left.  Sugars were running 200 range after starting medication.  Stopped drinking sodas.  Trying to walk more and eat more salads.  Was working on goals for physical activity, but then ill for a bit and has been raining, so just now getting back.    4.  Dyslipidemia:  Cholesterol panel not at goal.  LDL at 99 and trigs high, HDL low. Lipid Panel     Component Value Date/Time   CHOL 184 09/28/2021 1116   TRIG 337 (H) 09/28/2021 1116   HDL 28 (L) 09/28/2021 1116   LDLCALC 99 09/28/2021 1116   LABVLDL 57 (H) 09/28/2021 1116     Current Meds  Medication Sig   AgaMatrix Ultra-Thin Lancets MISC Check blood glucose twice daily before meals   blood glucose meter kit and supplies Dispense based on patient and insurance preference. Use up to four times daily as directed. (FOR ICD-10 E10.9, E11.9).   Blood Glucose Monitoring Suppl (AGAMATRIX PRESTO) w/Device KIT Check blood glucose twice daily before meals   calcium carbonate (TUMS - DOSED IN MG ELEMENTAL CALCIUM) 500 MG chewable tablet Chew 1 tablet  by mouth as needed for indigestion or heartburn.   glipiZIDE (GLUCOTROL) 5 MG tablet 1 tab by mouth twice daily with meals   glucose blood (AGAMATRIX PRESTO TEST) test strip Check blood glucose twice daily before meals.   lisinopril (ZESTRIL) 20 MG tablet Take 1 tablet (20 mg total) by mouth daily.   metFORMIN (GLUCOPHAGE-XR) 500 MG 24 hr tablet 2 tabs by mouth twice daily with meals   No Known Allergies  Social History   Socioeconomic History   Marital status: Married    Spouse name: Rupert Stacks   Number of children: 2   Years of education: Not on file   Highest education level: Not on file  Occupational History   Occupation: house pressure washing.  Tobacco Use   Smoking status: Never    Passive exposure: Never   Smokeless tobacco: Never  Vaping Use   Vaping Use: Never used  Substance and Sexual Activity   Alcohol use: No   Drug use: No   Sexual activity: Not Currently    Birth control/protection: None, Rhythm  Other Topics Concern   Not on file  Social History Narrative   Lives at home with wife and 2 children.   Social Determinants of Health  Financial Resource Strain: Medium Risk (11/03/2021)   Overall Financial Resource Strain (CARDIA)    Difficulty of Paying Living Expenses: Somewhat hard  Food Insecurity: Food Insecurity Present (11/03/2021)   Hunger Vital Sign    Worried About Running Out of Food in the Last Year: Sometimes true    Ran Out of Food in the Last Year: Sometimes true  Transportation Needs: No Transportation Needs (11/03/2021)   PRAPARE - Administrator, Civil Service (Medical): No    Lack of Transportation (Non-Medical): No  Physical Activity: Not on file  Stress: Not on file  Social Connections: Not on file  Intimate Partner Violence: Not At Risk (11/03/2021)   Humiliation, Afraid, Rape, and Kick questionnaire    Fear of Current or Ex-Partner: No    Emotionally Abused: No    Physically Abused: No    Sexually  Abused: No      Review of Systems    Objective:   BP 130/80 (BP Location: Left Arm, Patient Position: Sitting, Cuff Size: Large)   Pulse 80   Resp 16   Ht 5' 9.75" (1.772 m)   Wt (!) 367 lb (166.5 kg)   BMI 53.04 kg/m   Physical Exam NAD HEENT:  PERRL, EOMI, conjunctivae without injection.  TMs pearly gray, nasal mucosa boggy with clear discharge.  Possibly some posterior pharyngeal cobbling.   Neck:  Supple, No adenopathy Chest:  CTA CV:  RRR without murmur or rub.  Radial and DP pulses normal and equal LE:  No edema.   Assessment & Plan   DM:  describes sugars being much lower since starting medication, but still 200.  Increase glipizide to 10 mg twice daily.Marland Kitchen  He was instructed to return monthly to refill meds as refills for 1 year.  Fasting labs in 2 months with A1C.  With me 2d later.  Eye referral.  2.  Dyslipidemia:  panel not at goal.  Start Atorvastatin 20 mg daily. FLP in 2 months before follow up   3.  Hypertension:  BP improved on Lisinopril.    4.  Allergies:  Loratadine 10 mg daily.  To call if does not improve  5.  Brings up pain in head and legs suffered since COVID and wants to know why he has this--will have him return to focus on these issues (end of visit, brings these up)  6.  Financial and Food insecurity:  referral to [employer], [facility]W.

## 2022-01-03 ENCOUNTER — Other Ambulatory Visit: Payer: Self-pay

## 2022-01-03 MED ORDER — LISINOPRIL 20 MG PO TABS: 20.0000 mg | ORAL_TABLET | Freq: Every day | ORAL | 11 refills | Status: DC

## 2022-01-03 MED ORDER — LORATADINE 10 MG PO TABS: 10.0000 mg | ORAL_TABLET | Freq: Every day | ORAL | 11 refills | Status: DC

## 2022-01-03 MED ORDER — GLIPIZIDE 10 MG PO TABS: ORAL_TABLET | ORAL | 11 refills | Status: DC

## 2022-01-03 MED ORDER — ATORVASTATIN CALCIUM 20 MG PO TABS: ORAL_TABLET | ORAL | 11 refills | Status: DC

## 2022-01-03 MED ORDER — METFORMIN HCL ER 500 MG PO TB24: ORAL_TABLET | ORAL | 11 refills | Status: DC

## 2022-03-10 ENCOUNTER — Other Ambulatory Visit: Payer: Self-pay

## 2022-03-14 ENCOUNTER — Ambulatory Visit: Payer: Self-pay | Admitting: Internal Medicine

## 2022-04-22 ENCOUNTER — Other Ambulatory Visit: Payer: Self-pay

## 2022-04-26 ENCOUNTER — Ambulatory Visit: Payer: Self-pay | Admitting: Internal Medicine

## 2022-06-03 ENCOUNTER — Other Ambulatory Visit: Payer: Self-pay

## 2022-06-07 ENCOUNTER — Ambulatory Visit: Payer: Self-pay | Admitting: Internal Medicine

## 2022-07-11 ENCOUNTER — Other Ambulatory Visit: Payer: Self-pay

## 2022-07-14 ENCOUNTER — Other Ambulatory Visit: Payer: Self-pay

## 2022-07-18 ENCOUNTER — Ambulatory Visit: Payer: Self-pay | Admitting: Internal Medicine

## 2022-08-26 ENCOUNTER — Other Ambulatory Visit: Payer: Self-pay

## 2022-08-26 DIAGNOSIS — E1169 Type 2 diabetes mellitus with other specified complication: Secondary | ICD-10-CM

## 2022-08-26 DIAGNOSIS — E1165 Type 2 diabetes mellitus with hyperglycemia: Secondary | ICD-10-CM

## 2022-08-28 DIAGNOSIS — Z5941 Food insecurity: Secondary | ICD-10-CM | POA: Insufficient documentation

## 2022-08-28 DIAGNOSIS — Z5986 Financial insecurity: Secondary | ICD-10-CM | POA: Insufficient documentation

## 2022-08-28 DIAGNOSIS — Z599 Problem related to housing and economic circumstances, unspecified: Secondary | ICD-10-CM | POA: Insufficient documentation

## 2022-08-29 ENCOUNTER — Encounter: Payer: Self-pay | Admitting: Internal Medicine

## 2022-08-29 ENCOUNTER — Ambulatory Visit: Payer: Self-pay | Admitting: Internal Medicine

## 2022-08-29 VITALS — BP 142/86 | HR 80 | Resp 16 | Ht 69.75 in | Wt 358.0 lb

## 2022-08-29 DIAGNOSIS — Z5986 Financial insecurity: Secondary | ICD-10-CM

## 2022-08-29 DIAGNOSIS — E1143 Type 2 diabetes mellitus with diabetic autonomic (poly)neuropathy: Secondary | ICD-10-CM

## 2022-08-29 DIAGNOSIS — E785 Hyperlipidemia, unspecified: Secondary | ICD-10-CM

## 2022-08-29 DIAGNOSIS — E1169 Type 2 diabetes mellitus with other specified complication: Secondary | ICD-10-CM

## 2022-08-29 DIAGNOSIS — E1142 Type 2 diabetes mellitus with diabetic polyneuropathy: Secondary | ICD-10-CM

## 2022-08-29 DIAGNOSIS — H547 Unspecified visual loss: Secondary | ICD-10-CM

## 2022-08-29 DIAGNOSIS — E1165 Type 2 diabetes mellitus with hyperglycemia: Secondary | ICD-10-CM | POA: Insufficient documentation

## 2022-08-29 DIAGNOSIS — Z6841 Body Mass Index (BMI) 40.0 and over, adult: Secondary | ICD-10-CM

## 2022-08-29 DIAGNOSIS — I10 Essential (primary) hypertension: Secondary | ICD-10-CM

## 2022-08-29 LAB — LIPID PANEL W/O CHOL/HDL RATIO
Cholesterol, Total: 169 mg/dL (ref 100–199)
HDL: 26 mg/dL — ABNORMAL LOW (ref >39)
LDL Chol Calc (NIH): 71 mg/dL (ref 0–99)
Triglycerides: 460 mg/dL — ABNORMAL HIGH (ref 0–149)
VLDL Cholesterol Cal: 72 mg/dL — ABNORMAL HIGH (ref 5–40)

## 2022-08-29 LAB — HEMOGLOBIN A1C
Est. average glucose Bld gHb Est-mCnc: 338 mg/dL
Hgb A1c MFr Bld: 13.4 % — ABNORMAL HIGH (ref 4.8–5.6)

## 2022-08-29 MED ORDER — FISH OIL 1000 MG PO CAPS: ORAL_CAPSULE | ORAL | 0 refills | Status: DC

## 2022-08-29 MED ORDER — GABAPENTIN 100 MG PO CAPS: ORAL_CAPSULE | ORAL | 11 refills | Status: DC

## 2022-08-29 MED ORDER — EMPAGLIFLOZIN 10 MG PO TABS: 10.0000 mg | ORAL_TABLET | Freq: Every day | ORAL | 11 refills | Status: DC

## 2022-08-29 MED ORDER — ATORVASTATIN CALCIUM 20 MG PO TABS
ORAL_TABLET | ORAL | 11 refills | Status: DC
Start: 2022-08-29 — End: 2022-11-26

## 2022-08-29 NOTE — Progress Notes (Signed)
    Subjective:    Patient ID: Roy Andrews, male   DOB: 01-11-1971, 52 y.o.   MRN: [id number]   HPI  Here after almost one year.  Shona Needles interprets   Financial insecurities:  has not been able to buy meds regularly in past year.  Has been taking Metformin most regularly.  He does feel he can start setting aside $18/month for his meds, including the Atorvastatin.    2. DM:  A1C did not return.    3.  Dyslipidemia:  not as concerning as would expect.  States has been off Atorvastatin for 2 months as watched a You Tube doc who suggested not taking due to side effects. Lipid Panel     Component Value Date/Time   CHOL 169 08/26/2022 0848   TRIG 460 (H) 08/26/2022 0848   HDL 26 (L) 08/26/2022 0848   LDLCALC 71 08/26/2022 0848   LABVLDL 72 (H) 08/26/2022 0848   4.  Decreased vision:  blurry and decreased acuity.    5.  Numbness, tingling and sometimes burning of feet and ankles.  Has been a problem since in hospital for COVID in 2020..    Current Meds  Medication Sig   glipiZIDE (GLUCOTROL) 10 MG tablet 1 tab by mouth twice daily with meals   ibuprofen (ADVIL) 600 MG tablet Take 1 tablet (600 mg total) by mouth every 8 (eight) hours as needed.   lisinopril (ZESTRIL) 20 MG tablet Take 1 tablet (20 mg total) by mouth daily.   metFORMIN (GLUCOPHAGE-XR) 500 MG 24 hr tablet 2 tabs by mouth twice daily with meals   No Known Allergies   Review of Systems    Objective:   BP (!) 142/86 (BP Location: Left Arm, Patient Position: Sitting, Cuff Size: Normal)   Pulse 80   Resp 16   Ht 5' 9.75" (1.772 m)   Wt (!) 358 lb (162.4 kg)   BMI 51.74 kg/m   Physical Exam NAD, obese HEENT:  PERRL, EOMI, TMs pearly gray, throat without injection Neck:  Supple, No adenopathy, no thyromegaly Chest:  CTA CV:  RRR with normal S1 and S2, No S3, S4 or murmur.  No Carotid bruit.  Carotid, radial and DP pulses normal and equal Abd:  S, NT, No HSM or mass, + BS LE:  No  edema.       Assessment & Plan   Obesity and DM:  A1C.  Continue Glipizide and Metformin at current dosing.  Add Jardiance from MAP--info given on how to apply with handout.   Diabetic eye referral--also for decreased visual acuity.   Encouraged lifestyle changes and weekly goals for both diet and physical activity.  2.  Hypertension:  Not at goal.  BP check with fasting labs in 6 weeks  3.  Hyperlipidemia:  restart Atorvastatin and Fish oil with FLP/hepatic profile in 6 weeks.  4.  Peripheral neuropathy:  not clear if due to COVID infection or worsening of DM due to COVID with increasing complications.  Start titration of Gabapentin.  5.  Financial issues:  Referral to Amparo Bristol, CHW for case management with food access to decrease costs.  He is willing to put $18 aside each month for meds.

## 2022-08-30 LAB — HGB A1C W/O EAG: Hgb A1c MFr Bld: 13.4 % — ABNORMAL HIGH (ref 4.8–5.6)

## 2022-09-06 ENCOUNTER — Emergency Department (HOSPITAL_COMMUNITY): Payer: Self-pay

## 2022-09-06 ENCOUNTER — Emergency Department (HOSPITAL_COMMUNITY)
Admission: EM | Admit: 2022-09-06 | Discharge: 2022-09-06 | Disposition: A | Discharge status: Home / Self Care | Payer: Self-pay | Attending: Emergency Medicine | Admitting: Emergency Medicine

## 2022-09-06 ENCOUNTER — Other Ambulatory Visit: Payer: Self-pay

## 2022-09-06 DIAGNOSIS — T148XXA Other injury of unspecified body region, initial encounter: Secondary | ICD-10-CM

## 2022-09-06 DIAGNOSIS — S91331A Puncture wound without foreign body, right foot, initial encounter: Secondary | ICD-10-CM | POA: Insufficient documentation

## 2022-09-06 DIAGNOSIS — Z23 Encounter for immunization: Secondary | ICD-10-CM | POA: Insufficient documentation

## 2022-09-06 DIAGNOSIS — W450XXA Nail entering through skin, initial encounter: Secondary | ICD-10-CM | POA: Insufficient documentation

## 2022-09-06 DIAGNOSIS — E1140 Type 2 diabetes mellitus with diabetic neuropathy, unspecified: Secondary | ICD-10-CM | POA: Insufficient documentation

## 2022-09-06 MED ORDER — CIPROFLOXACIN HCL 500 MG PO TABS: 500.0000 mg | ORAL_TABLET | Freq: Once | ORAL | Status: DC

## 2022-09-06 MED ORDER — LEVOFLOXACIN 750 MG PO TABS
750.0000 mg | ORAL_TABLET | Freq: Once | ORAL | Status: AC
  Administered 2022-09-06: 750 mg via ORAL
  Filled 2022-09-06: qty 1

## 2022-09-06 MED ORDER — LEVOFLOXACIN 750 MG PO TABS: 750.0000 mg | ORAL_TABLET | Freq: Every day | ORAL | 0 refills | Status: DC

## 2022-09-06 MED ORDER — BACITRACIN ZINC 500 UNIT/GM EX OINT
TOPICAL_OINTMENT | Freq: Two times a day (BID) | CUTANEOUS | Status: DC
  Administered 2022-09-06: 1 via TOPICAL

## 2022-09-06 MED ORDER — TETANUS-DIPHTH-ACELL PERTUSSIS 5-2.5-18.5 LF-MCG/0.5 IM SUSY
0.5000 mL | PREFILLED_SYRINGE | Freq: Once | INTRAMUSCULAR | Status: AC
  Administered 2022-09-06: 0.5 mL via INTRAMUSCULAR
  Filled 2022-09-06: qty 0.5

## 2022-09-06 NOTE — Discharge Instructions (Signed)
Monitor your blood sugar closely.  The antibiotic can lower your blood sugar when taken with your other medications.  Take as directed on the label.    Return for new or worsening symptoms.

## 2022-09-06 NOTE — ED Provider Notes (Signed)
[facility]ENC[facility] DEPT [facility] Emergency Department Provider Note MRN:  [id number]  Arrival date & time: 09/06/22     Chief Complaint   Foot Injury (Nail )   History of Present Illness   [facility] Karrie Meres Chales Salmon is a 52 y.o. year-old male presents to the ED with chief complaint of having stepped on a nail.  States the injury occurred yesterday.  He has history of diabetes and neuropathy.  States he didn't know he had an injury until he took his shoe off and saw blood.  Last Tdap unknown.    History provided by patient.   Review of Systems  Pertinent positive and negative review of systems noted in HPI.    Physical Exam   Vitals:   09/06/22 0330 09/06/22 0345  BP: 136/78 (!) 143/86  Pulse: 86 77  Resp: 18 15  Temp:    SpO2: 93% 95%    CONSTITUTIONAL:  non toxic-appearing, NAD NEURO:  Alert and oriented x 3, CN 3-12 grossly intact EYES:  eyes equal and reactive ENT/NECK:  Supple, no stridor  CARDIO:  normal rate, regular rhythm, appears well-perfused  PULM:  No respiratory distress,  GI/GU:  non-distended,  MSK/SPINE:  No gross deformities, no edema, moves all extremities  SKIN:  small puncture wound to ball of right foot near the 3 digit, no visible FB or surrounding erythema   *Additional and/or pertinent findings included in MDM below  Diagnostic and Interventional Summary    EKG Interpretation  Date/Time:    Ventricular Rate:    PR Interval:    QRS Duration:   QT Interval:    QTC Calculation:   R Axis:     Text Interpretation:         Labs Reviewed - No data to display  DG Foot Complete Right  Final Result      Medications  bacitracin ointment (has no administration in time range)  Tdap (BOOSTRIX) injection 0.5 mL (0.5 mLs Intramuscular Given 09/06/22 0326)  levofloxacin (LEVAQUIN) tablet 750 mg (750 mg Oral Given 09/06/22 0326)     Procedures  /  Critical Care Procedures  ED Course and Medical Decision Making  I have reviewed the  triage vital signs, the nursing notes, and pertinent available records from the EMR.  Social Determinants Affecting Complexity of Care: Patient has no clinically significant social determinants affecting this chief complaint..   ED Course:    Medical Decision Making Patient stepped on a nail yesterday.  He is high risk for infection due to his diabetes.  The nail went through his shoe.  Will cover for pseudomonas with Levaquin.  Tdap updated.  Return precautions discussed.  Amount and/or Complexity of Data Reviewed Radiology: ordered and independent interpretation performed.    Details: No FB seen  Risk OTC drugs. Prescription drug management.     Consultants: No consultations were needed in caring for this patient.   Treatment and Plan: Emergency department workup does not suggest an emergent condition requiring admission or immediate intervention beyond  what has been performed at this time. The patient is safe for discharge and has  been instructed to return immediately for worsening symptoms, change in  symptoms or any other concerns  Patient discussed with attending physician, Dr. Eudelia Bunch, who agrees with plan to treat with Levaquin.  Final Clinical Impressions(s) / ED Diagnoses     ICD-10-CM   1. Puncture wound  [email or web address]       ED Discharge Orders  Ordered    levofloxacin (LEVAQUIN) 750 MG tablet  Daily        09/06/22 0351              Discharge Instructions Discussed with and Provided to Patient:    Discharge Instructions      Monitor your blood sugar closely.  The antibiotic can lower your blood sugar when taken with your other medications.  Take as directed on the label.    Return for new or worsening symptoms.      Roxy Horseman, PA-C 09/06/22 0410    Nira Conn, MD 09/06/22 [phone]

## 2022-09-06 NOTE — ED Triage Notes (Signed)
Patient accidentally stepped on a nail yesterday afternoon , reports mild pain at right foot , no tetanus immunization .

## 2022-09-16 ENCOUNTER — Telehealth: Payer: Self-pay | Admitting: Internal Medicine

## 2022-09-19 NOTE — Telephone Encounter (Signed)
Patient stated that he does not have any income and therefore does not have income documentation. He was not aware of letter of support. He said that he would get a letter of support and look to completing application for the map program.

## 2022-10-14 ENCOUNTER — Other Ambulatory Visit: Payer: Self-pay

## 2022-11-14 ENCOUNTER — Other Ambulatory Visit: Payer: Self-pay

## 2022-11-16 ENCOUNTER — Ambulatory Visit: Payer: Self-pay | Admitting: Internal Medicine

## 2022-11-23 ENCOUNTER — Ambulatory Visit: Payer: Self-pay | Admitting: Internal Medicine

## 2022-11-23 ENCOUNTER — Encounter: Payer: Self-pay | Admitting: Internal Medicine

## 2022-11-23 VITALS — BP 108/78 | HR 87 | Ht 69.75 in | Wt 340.5 lb

## 2022-11-23 DIAGNOSIS — E1142 Type 2 diabetes mellitus with diabetic polyneuropathy: Secondary | ICD-10-CM

## 2022-11-23 DIAGNOSIS — E785 Hyperlipidemia, unspecified: Secondary | ICD-10-CM

## 2022-11-23 DIAGNOSIS — E1165 Type 2 diabetes mellitus with hyperglycemia: Secondary | ICD-10-CM

## 2022-11-23 DIAGNOSIS — E1169 Type 2 diabetes mellitus with other specified complication: Secondary | ICD-10-CM

## 2022-11-23 DIAGNOSIS — R0789 Other chest pain: Secondary | ICD-10-CM

## 2022-11-23 DIAGNOSIS — Z6841 Body Mass Index (BMI) 40.0 and over, adult: Secondary | ICD-10-CM

## 2022-11-23 DIAGNOSIS — I10 Essential (primary) hypertension: Secondary | ICD-10-CM

## 2022-11-23 MED ORDER — ASPIRIN 81 MG PO TBEC: 81.0000 mg | DELAYED_RELEASE_TABLET | Freq: Every day | ORAL | Status: AC

## 2022-11-23 MED ORDER — CARVEDILOL 3.125 MG PO TABS: 3.1250 mg | ORAL_TABLET | Freq: Two times a day (BID) | ORAL | 11 refills | Status: DC

## 2022-11-23 MED ORDER — GABAPENTIN 100 MG PO CAPS: ORAL_CAPSULE | ORAL | 11 refills | Status: DC

## 2022-11-23 MED ORDER — NITROGLYCERIN 0.4 MG SL SUBL: 0.4000 mg | SUBLINGUAL_TABLET | SUBLINGUAL | 1 refills | Status: DC | PRN

## 2022-11-23 NOTE — Patient Instructions (Signed)
No significant exertion or work until cardiology evaluation. Go to ED if another episode of chest pressure with new meds.

## 2022-11-23 NOTE — Progress Notes (Signed)
Subjective:    Patient ID: Roy Andrews, male   DOB: 06-30-1970, 52 y.o.   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  Symptoms lasted 1-2 hours.  Felt pressure bilaterally in posterior chest up to neck.  Admits to bilateral anterior chest pressure during this episode as well.   Since this episode, he has similar symptoms every time he is active for more than an hour.  Can be minimal activity--such as sweeping inside.  The discomfort in his back or upper chest radiates up to his head and he feels he will pass out.  He has not had the anterior chest pressure since first episode 1 month ago.  Lasting now about 3-4 hours with fatigue lasting longer.  Has had about 10-15 episodes since first.   Last episode was yesterday.  Started at noon and ended at 4 p.m. Has to sit or lie down to rest with each episode.    2.  DM:  He is taking all of prescribed meds and when he checks sugars running low to 130 glucose.  A1C was above 13% in April.    3.  Hypertension:  taking meds.  Controlled.  Able to afford meds monthly currently.      4. Dyslipidemia:  states not missing Atorvastatin after last visit in April.    5.  Peripheral neuropathy:  stopped Gabapentin and switched to OTC alpha lipoic acid.    Current Meds  Medication Sig   AgaMatrix Ultra-Thin Lancets MISC Check blood glucose twice daily before meals   atorvastatin (LIPITOR) 20 MG tablet 1 tab by mouth daily with evening meal.   blood glucose meter kit and supplies Dispense based on patient and insurance preference. Use up to four times daily as directed. (FOR ICD-10 E10.9, E11.9).   Blood Glucose Monitoring Suppl (AGAMATRIX PRESTO) w/Device KIT Check blood glucose twice daily before meals   empagliflozin (JARDIANCE) 10 MG TABS tablet Take 1 tablet (10 mg total) by mouth daily before breakfast.   gabapentin (NEURONTIN) 100 MG capsule 1 cap by mouth at bedtime  for 3 days then increase to 2 caps for 3 days then increase to 3 caps at bedtime and remain on that dose.   glipiZIDE (GLUCOTROL) 10 MG tablet 1 tab by mouth twice daily with meals   glucose blood (AGAMATRIX PRESTO TEST) test strip Check blood glucose twice daily before meals.   ibuprofen (ADVIL) 600 MG tablet Take 1 tablet (600 mg total) by mouth every 8 (eight) hours as needed.   lisinopril (ZESTRIL) 20 MG tablet Take 1 tablet (20 mg total) by mouth daily.   metFORMIN (GLUCOPHAGE-XR) 500 MG 24 hr tablet 2 tabs by mouth twice daily with meals   No Known Allergies   Review of Systems    Objective:   BP 108/78 (BP Location: Left Arm, Patient Position: Sitting, Cuff Size: Normal)   Pulse 87   Ht 5' 9.75" (1.772 m)   Wt (!) 340 lb 8 oz (154.4 kg)   SpO2 97%   BMI 49.21 kg/m   Physical Exam NAD Morbidly obese Lungs:  CTA CV:  RRR with normal S1 and S2, No S3, S4 or murmur.  No JVD, though neck size limits exam, or carotid bruits.  Carotid, radial and DP pulses normal and equal Abd:  S, NT, No HSM or mass, + BS LE:  woody edema of anterior tibial  areas bilaterally with mild to moderate pitting.  ECG:  NSR.  No signs of previous injury Assessment & Plan   New onset exertional chest pressure with LE edema:  no other sign or symptom of CHF.   ECG without injury pattern.   Start ASA 81 mg daily,  Carvedilol 3.125 mg twice daily.   NTG prn.   Follow up beginning of next week to see how he is doing.  No exertion or work for now. Progress report tomorrow. CBC, CMP, Troponin I not available, so will check T as had episode yesterday. Cardiology referral for what may likely be cardiac etiology  2.  DM:  A1C.  Consider addition of Ozempic from MAP-discussed  3.  Hypertension:  controlled.    4.  Dyslipidemia:  FLP.  5.  Peripheral neuropathy:  increase gabapentin to 300 mg 3 times daily.

## 2022-11-24 ENCOUNTER — Other Ambulatory Visit: Payer: Self-pay

## 2022-11-24 LAB — COMPREHENSIVE METABOLIC PANEL
ALT: 44 IU/L (ref 0–44)
AST: 20 IU/L (ref 0–40)
Albumin: 4.1 g/dL (ref 4.1–5.1)
Alkaline Phosphatase: 132 IU/L — ABNORMAL HIGH (ref 44–121)
BUN/Creatinine Ratio: 20 (ref 9–20)
BUN: 33 mg/dL — ABNORMAL HIGH (ref 6–24)
Bilirubin Total: 0.7 mg/dL (ref 0.0–1.2)
CO2: 24 mmol/L (ref 20–29)
Calcium: 10.9 mg/dL — ABNORMAL HIGH (ref 8.7–10.2)
Chloride: 99 mmol/L (ref 96–106)
Creatinine, Ser: 1.65 mg/dL — ABNORMAL HIGH (ref 0.76–1.27)
Globulin, Total: 3.6 g/dL (ref 1.5–4.5)
Glucose: 108 mg/dL — ABNORMAL HIGH (ref 70–99)
Potassium: 5.7 mmol/L — ABNORMAL HIGH (ref 3.5–5.2)
Sodium: 135 mmol/L (ref 134–144)
Total Protein: 7.7 g/dL (ref 6.0–8.5)
eGFR: 50 mL/min/{1.73_m2} — ABNORMAL LOW (ref >59)

## 2022-11-24 LAB — CBC WITH DIFFERENTIAL/PLATELET
Basophils Absolute: 0.1 10*3/uL (ref 0.0–0.2)
Basos: 1 %
EOS (ABSOLUTE): 0.3 10*3/uL (ref 0.0–0.4)
Eos: 5 %
Hematocrit: 43.5 % (ref 37.5–51.0)
Hemoglobin: 14.6 g/dL (ref 13.0–17.7)
Immature Grans (Abs): 0.1 10*3/uL (ref 0.0–0.1)
Immature Granulocytes: 1 %
Lymphocytes Absolute: 1.5 10*3/uL (ref 0.7–3.1)
Lymphs: 22 %
MCH: 29.8 pg (ref 26.6–33.0)
MCHC: 33.6 g/dL (ref 31.5–35.7)
MCV: 89 fL (ref 79–97)
Monocytes Absolute: 1.2 10*3/uL — ABNORMAL HIGH (ref 0.1–0.9)
Monocytes: 18 %
Neutrophils Absolute: 3.5 10*3/uL (ref 1.4–7.0)
Neutrophils: 53 %
Platelets: 201 10*3/uL (ref 150–450)
RBC: 4.9 x10E6/uL (ref 4.14–5.80)
RDW: 13.9 % (ref 11.6–15.4)
WBC: 6.5 10*3/uL (ref 3.4–10.8)

## 2022-11-24 LAB — TROPONIN T: Troponin T (Highly Sensitive): 27 ng/L (ref 0–22)

## 2022-11-24 LAB — HGB A1C W/O EAG: Hgb A1c MFr Bld: 8.9 % — ABNORMAL HIGH (ref 4.8–5.6)

## 2022-11-24 LAB — LIPID PANEL W/O CHOL/HDL RATIO
Cholesterol, Total: 168 mg/dL (ref 100–199)
HDL: 28 mg/dL — ABNORMAL LOW (ref >39)
LDL Chol Calc (NIH): 99 mg/dL (ref 0–99)
Triglycerides: 241 mg/dL — ABNORMAL HIGH (ref 0–149)
VLDL Cholesterol Cal: 41 mg/dL — ABNORMAL HIGH (ref 5–40)

## 2022-11-25 ENCOUNTER — Other Ambulatory Visit: Payer: Self-pay

## 2022-11-25 LAB — COMPREHENSIVE METABOLIC PANEL
ALT: 36 IU/L (ref 0–44)
AST: 22 IU/L (ref 0–40)
Albumin: 4 g/dL — ABNORMAL LOW (ref 4.1–5.1)
Alkaline Phosphatase: 135 IU/L — ABNORMAL HIGH (ref 44–121)
BUN/Creatinine Ratio: 25 — ABNORMAL HIGH (ref 9–20)
BUN: 42 mg/dL — ABNORMAL HIGH (ref 6–24)
Bilirubin Total: 0.4 mg/dL (ref 0.0–1.2)
CO2: 20 mmol/L (ref 20–29)
Calcium: 11.3 mg/dL — ABNORMAL HIGH (ref 8.7–10.2)
Chloride: 96 mmol/L (ref 96–106)
Creatinine, Ser: 1.66 mg/dL — ABNORMAL HIGH (ref 0.76–1.27)
Globulin, Total: 3.7 g/dL (ref 1.5–4.5)
Glucose: 108 mg/dL — ABNORMAL HIGH (ref 70–99)
Potassium: 5.3 mmol/L — ABNORMAL HIGH (ref 3.5–5.2)
Sodium: 132 mmol/L — ABNORMAL LOW (ref 134–144)
Total Protein: 7.7 g/dL (ref 6.0–8.5)
eGFR: 50 mL/min/{1.73_m2} — ABNORMAL LOW (ref >59)

## 2022-11-25 LAB — TROPONIN T: Troponin T (Highly Sensitive): 27 ng/L (ref 0–22)

## 2022-11-26 ENCOUNTER — Emergency Department (HOSPITAL_COMMUNITY): Payer: Self-pay

## 2022-11-26 ENCOUNTER — Observation Stay (HOSPITAL_BASED_OUTPATIENT_CLINIC_OR_DEPARTMENT_OTHER): Payer: Self-pay

## 2022-11-26 ENCOUNTER — Other Ambulatory Visit: Payer: Self-pay

## 2022-11-26 ENCOUNTER — Other Ambulatory Visit: Payer: Self-pay | Admitting: Internal Medicine

## 2022-11-26 ENCOUNTER — Telehealth: Payer: Self-pay | Admitting: Internal Medicine

## 2022-11-26 ENCOUNTER — Encounter (HOSPITAL_COMMUNITY): Payer: Self-pay

## 2022-11-26 ENCOUNTER — Observation Stay (HOSPITAL_COMMUNITY): Payer: Self-pay

## 2022-11-26 ENCOUNTER — Inpatient Hospital Stay (HOSPITAL_COMMUNITY)
Admission: EM | Admit: 2022-11-26 | Discharge: 2022-12-03 | DRG: 683 | Disposition: A | Discharge status: Home / Self Care | Payer: Self-pay | Attending: Internal Medicine | Admitting: Internal Medicine

## 2022-11-26 DIAGNOSIS — Z79899 Other long term (current) drug therapy: Secondary | ICD-10-CM

## 2022-11-26 DIAGNOSIS — Z7982 Long term (current) use of aspirin: Secondary | ICD-10-CM

## 2022-11-26 DIAGNOSIS — Z833 Family history of diabetes mellitus: Secondary | ICD-10-CM

## 2022-11-26 DIAGNOSIS — R0609 Other forms of dyspnea: Secondary | ICD-10-CM | POA: Diagnosis present

## 2022-11-26 DIAGNOSIS — E871 Hypo-osmolality and hyponatremia: Secondary | ICD-10-CM | POA: Diagnosis present

## 2022-11-26 DIAGNOSIS — R0602 Shortness of breath: Secondary | ICD-10-CM

## 2022-11-26 DIAGNOSIS — M62838 Other muscle spasm: Secondary | ICD-10-CM | POA: Diagnosis present

## 2022-11-26 DIAGNOSIS — N179 Acute kidney failure, unspecified: Principal | ICD-10-CM | POA: Diagnosis present

## 2022-11-26 DIAGNOSIS — Z7984 Long term (current) use of oral hypoglycemic drugs: Secondary | ICD-10-CM

## 2022-11-26 DIAGNOSIS — R590 Localized enlarged lymph nodes: Secondary | ICD-10-CM | POA: Diagnosis present

## 2022-11-26 DIAGNOSIS — I10 Essential (primary) hypertension: Secondary | ICD-10-CM | POA: Diagnosis present

## 2022-11-26 DIAGNOSIS — Z8701 Personal history of pneumonia (recurrent): Secondary | ICD-10-CM

## 2022-11-26 DIAGNOSIS — E785 Hyperlipidemia, unspecified: Secondary | ICD-10-CM | POA: Diagnosis present

## 2022-11-26 DIAGNOSIS — E875 Hyperkalemia: Secondary | ICD-10-CM | POA: Diagnosis present

## 2022-11-26 DIAGNOSIS — E1169 Type 2 diabetes mellitus with other specified complication: Secondary | ICD-10-CM | POA: Diagnosis present

## 2022-11-26 DIAGNOSIS — R911 Solitary pulmonary nodule: Secondary | ICD-10-CM | POA: Diagnosis present

## 2022-11-26 DIAGNOSIS — E1142 Type 2 diabetes mellitus with diabetic polyneuropathy: Secondary | ICD-10-CM | POA: Diagnosis present

## 2022-11-26 DIAGNOSIS — R748 Abnormal levels of other serum enzymes: Secondary | ICD-10-CM | POA: Insufficient documentation

## 2022-11-26 DIAGNOSIS — M549 Dorsalgia, unspecified: Secondary | ICD-10-CM | POA: Diagnosis present

## 2022-11-26 DIAGNOSIS — Z597 Insufficient social insurance and welfare support: Secondary | ICD-10-CM

## 2022-11-26 DIAGNOSIS — R81 Glycosuria: Secondary | ICD-10-CM | POA: Diagnosis present

## 2022-11-26 DIAGNOSIS — R768 Other specified abnormal immunological findings in serum: Secondary | ICD-10-CM | POA: Diagnosis present

## 2022-11-26 DIAGNOSIS — Z8616 Personal history of COVID-19: Secondary | ICD-10-CM

## 2022-11-26 DIAGNOSIS — R0789 Other chest pain: Secondary | ICD-10-CM

## 2022-11-26 DIAGNOSIS — E1165 Type 2 diabetes mellitus with hyperglycemia: Secondary | ICD-10-CM | POA: Diagnosis present

## 2022-11-26 DIAGNOSIS — G4733 Obstructive sleep apnea (adult) (pediatric): Secondary | ICD-10-CM | POA: Diagnosis present

## 2022-11-26 DIAGNOSIS — Z6841 Body Mass Index (BMI) 40.0 and over, adult: Secondary | ICD-10-CM

## 2022-11-26 DIAGNOSIS — F419 Anxiety disorder, unspecified: Secondary | ICD-10-CM | POA: Diagnosis present

## 2022-11-26 DIAGNOSIS — N400 Enlarged prostate without lower urinary tract symptoms: Secondary | ICD-10-CM | POA: Diagnosis present

## 2022-11-26 LAB — CBC
HCT: 43.8 % (ref 39.0–52.0)
Hemoglobin: 14.5 g/dL (ref 13.0–17.0)
MCH: 30 pg (ref 26.0–34.0)
MCHC: 33.1 g/dL (ref 30.0–36.0)
MCV: 90.5 fL (ref 80.0–100.0)
Platelets: 193 10*3/uL (ref 150–400)
RBC: 4.84 MIL/uL (ref 4.22–5.81)
RDW: 13.2 % (ref 11.5–15.5)
WBC: 5.7 10*3/uL (ref 4.0–10.5)
nRBC: 0 % (ref 0.0–0.2)

## 2022-11-26 LAB — URINALYSIS, COMPLETE (UACMP) WITH MICROSCOPIC
Bacteria, UA: NONE SEEN
Bilirubin Urine: NEGATIVE
Glucose, UA: >=500 mg/dL — AB
Hgb urine dipstick: NEGATIVE
Ketones, ur: NEGATIVE mg/dL
Leukocytes,Ua: NEGATIVE
Nitrite: NEGATIVE
Protein, ur: NEGATIVE mg/dL
Specific Gravity, Urine: 1.026 (ref 1.005–1.030)
pH: 5 (ref 5.0–8.0)

## 2022-11-26 LAB — BASIC METABOLIC PANEL
Anion gap: 12 (ref 5–15)
BUN: 38 mg/dL — ABNORMAL HIGH (ref 6–20)
CO2: 22 mmol/L (ref 22–32)
Calcium: 10.6 mg/dL — ABNORMAL HIGH (ref 8.9–10.3)
Chloride: 100 mmol/L (ref 98–111)
Creatinine, Ser: 1.56 mg/dL — ABNORMAL HIGH (ref 0.61–1.24)
GFR, Estimated: 54 mL/min — ABNORMAL LOW (ref >60)
Glucose, Bld: 127 mg/dL — ABNORMAL HIGH (ref 70–99)
Potassium: 5.7 mmol/L — ABNORMAL HIGH (ref 3.5–5.1)
Sodium: 134 mmol/L — ABNORMAL LOW (ref 135–145)

## 2022-11-26 LAB — D-DIMER, QUANTITATIVE: D-Dimer, Quant: 0.37 ug/mL-FEU (ref 0.00–0.50)

## 2022-11-26 LAB — HEPATIC FUNCTION PANEL
ALT: 37 U/L (ref 0–44)
AST: 21 U/L (ref 15–41)
Albumin: 3.5 g/dL (ref 3.5–5.0)
Alkaline Phosphatase: 108 U/L (ref 38–126)
Bilirubin, Direct: 0.1 mg/dL (ref 0.0–0.2)
Indirect Bilirubin: 0.6 mg/dL (ref 0.3–0.9)
Total Bilirubin: 0.7 mg/dL (ref 0.3–1.2)
Total Protein: 7.6 g/dL (ref 6.5–8.1)

## 2022-11-26 LAB — NA AND K (SODIUM & POTASSIUM), RAND UR
Potassium Urine: 62 mmol/L
Sodium, Ur: 65 mmol/L

## 2022-11-26 LAB — TROPONIN I (HIGH SENSITIVITY)
Troponin I (High Sensitivity): 8 ng/L (ref <18)
Troponin I (High Sensitivity): 8 ng/L (ref <18)

## 2022-11-26 LAB — ECHOCARDIOGRAM COMPLETE: Weight: 5424 oz

## 2022-11-26 LAB — GLUCOSE, CAPILLARY: Glucose-Capillary: 163 mg/dL — ABNORMAL HIGH (ref 70–99)

## 2022-11-26 LAB — BRAIN NATRIURETIC PEPTIDE: B Natriuretic Peptide: 37.3 pg/mL (ref 0.0–100.0)

## 2022-11-26 LAB — C-REACTIVE PROTEIN: CRP: 0.6 mg/dL (ref <1.0)

## 2022-11-26 LAB — HIV ANTIBODY (ROUTINE TESTING W REFLEX): HIV Screen 4th Generation wRfx: NONREACTIVE

## 2022-11-26 MED ORDER — SENNOSIDES-DOCUSATE SODIUM 8.6-50 MG PO TABS: 1.0000 | ORAL_TABLET | Freq: Every evening | ORAL | Status: DC | PRN

## 2022-11-26 MED ORDER — GABAPENTIN 300 MG PO CAPS
300.0000 mg | ORAL_CAPSULE | ORAL | Status: AC
  Administered 2022-11-26: 300 mg via ORAL
  Filled 2022-11-26: qty 1

## 2022-11-26 MED ORDER — ACETAMINOPHEN 650 MG RE SUPP: 650.0000 mg | Freq: Four times a day (QID) | RECTAL | Status: DC | PRN

## 2022-11-26 MED ORDER — INSULIN ASPART 100 UNIT/ML IJ SOLN
0.0000 [IU] | Freq: Three times a day (TID) | INTRAMUSCULAR | Status: DC
  Administered 2022-11-27: 4 [IU] via SUBCUTANEOUS
  Administered 2022-11-29 – 2022-12-01 (×5): 3 [IU] via SUBCUTANEOUS
  Administered 2022-12-01 – 2022-12-02 (×2): 4 [IU] via SUBCUTANEOUS
  Administered 2022-12-02: 3 [IU] via SUBCUTANEOUS

## 2022-11-26 MED ORDER — SODIUM CHLORIDE 0.9% FLUSH
3.0000 mL | Freq: Two times a day (BID) | INTRAVENOUS | Status: DC
  Administered 2022-11-26 – 2022-12-03 (×14): 3 mL via INTRAVENOUS

## 2022-11-26 MED ORDER — LISINOPRIL 20 MG PO TABS
20.0000 mg | ORAL_TABLET | Freq: Every day | ORAL | Status: DC
  Filled 2022-11-26: qty 1

## 2022-11-26 MED ORDER — EMPAGLIFLOZIN 10 MG PO TABS: 10.0000 mg | ORAL_TABLET | Freq: Every day | ORAL | Status: DC

## 2022-11-26 MED ORDER — EMPAGLIFLOZIN 10 MG PO TABS
10.0000 mg | ORAL_TABLET | ORAL | Status: AC
  Administered 2022-11-26: 10 mg via ORAL
  Filled 2022-11-26: qty 1

## 2022-11-26 MED ORDER — IOHEXOL 350 MG/ML SOLN
75.0000 mL | Freq: Once | INTRAVENOUS | Status: AC | PRN
  Administered 2022-11-26: 75 mL via INTRAVENOUS

## 2022-11-26 MED ORDER — CARVEDILOL 3.125 MG PO TABS: 3.1250 mg | ORAL_TABLET | ORAL | Status: DC

## 2022-11-26 MED ORDER — SODIUM ZIRCONIUM CYCLOSILICATE 10 G PO PACK
10.0000 g | PACK | ORAL | Status: AC
  Administered 2022-11-26: 10 g via ORAL
  Filled 2022-11-26: qty 1

## 2022-11-26 MED ORDER — ATORVASTATIN CALCIUM 40 MG PO TABS
40.0000 mg | ORAL_TABLET | Freq: Every day | ORAL | Status: DC
  Administered 2022-11-26 – 2022-12-03 (×8): 40 mg via ORAL
  Filled 2022-11-26 (×8): qty 1

## 2022-11-26 MED ORDER — TIRZEPATIDE 2.5 MG/0.5ML ~~LOC~~ SOAJ
2.5000 mg | SUBCUTANEOUS | 3 refills | Status: DC
Start: 2022-11-26 — End: 2022-12-08

## 2022-11-26 MED ORDER — INSULIN ASPART 100 UNIT/ML IJ SOLN: 0.0000 [IU] | Freq: Every day | INTRAMUSCULAR | Status: DC

## 2022-11-26 MED ORDER — ENOXAPARIN SODIUM 80 MG/0.8ML IJ SOSY
75.0000 mg | PREFILLED_SYRINGE | INTRAMUSCULAR | Status: DC
  Administered 2022-11-26: 75 mg via SUBCUTANEOUS
  Filled 2022-11-26: qty 0.75

## 2022-11-26 MED ORDER — METFORMIN HCL ER 500 MG PO TB24
500.0000 mg | ORAL_TABLET | Freq: Once | ORAL | Status: AC
  Administered 2022-11-26: 500 mg via ORAL
  Filled 2022-11-26: qty 1

## 2022-11-26 MED ORDER — ASPIRIN 81 MG PO TBEC
81.0000 mg | DELAYED_RELEASE_TABLET | ORAL | Status: AC
  Administered 2022-11-26: 81 mg via ORAL

## 2022-11-26 MED ORDER — ALBUTEROL SULFATE (2.5 MG/3ML) 0.083% IN NEBU: 2.5000 mg | INHALATION_SOLUTION | RESPIRATORY_TRACT | Status: DC | PRN

## 2022-11-26 MED ORDER — SODIUM CHLORIDE 0.9 % IV BOLUS
500.0000 mL | Freq: Once | INTRAVENOUS | Status: AC
  Administered 2022-11-26: 500 mL via INTRAVENOUS

## 2022-11-26 MED ORDER — ACETAMINOPHEN 325 MG PO TABS: 650.0000 mg | ORAL_TABLET | Freq: Four times a day (QID) | ORAL | Status: DC | PRN

## 2022-11-26 MED ORDER — GLIPIZIDE 10 MG PO TABS
10.0000 mg | ORAL_TABLET | Freq: Once | ORAL | Status: AC
  Administered 2022-11-26: 10 mg via ORAL
  Filled 2022-11-26: qty 1

## 2022-11-26 MED ORDER — ASPIRIN 81 MG PO TBEC
81.0000 mg | DELAYED_RELEASE_TABLET | Freq: Every day | ORAL | Status: DC
  Filled 2022-11-26: qty 1

## 2022-11-26 MED ORDER — CARVEDILOL 3.125 MG PO TABS: 3.1250 mg | ORAL_TABLET | Freq: Two times a day (BID) | ORAL | Status: DC

## 2022-11-26 MED ORDER — ATORVASTATIN CALCIUM 40 MG PO TABS: ORAL_TABLET | ORAL | 3 refills | Status: DC

## 2022-11-26 NOTE — Telephone Encounter (Signed)
52 yo son Roy Andrews interprets  He did have some posterior chest pressure yesterday evening.  He denied any chest pressure since seen on the 17th when called him yesterday morning. He still has not picked up the NTG, so did not take with the chest pressure.   He is fatigued.   He stated yesterday he had started the Carvedilol and ASA  Discussed his Troponin T remains high, though do not see a peak and taper and renal function is decreased as compared to 1 year ago--not clear if that is a recent development.  Potassium was better with blood draw yesterday. Discussed could have had an MI recently and is now in a bit of CHF, particularly since he noted increased LE earlier in month when exertional chest pressure first started.     Discussed with recurrent chest pressure despite additional medication, would like for him to go to ED today and not wait for a cardiology referral outpatient. Concerned for unstable angina  He had not had a BNP or CXR yet His calcium is also elevated and planned to have him return for PTH and ionized Ca++  Will see if Mounjaro available at [facility] for better DM control as A1C still not at goal, though much improved with A1C at 8.9%. Also, will need to increase Atorvastatin to 80 mg daily.Marland Kitchen

## 2022-11-26 NOTE — ED Triage Notes (Signed)
Pt arrived POV from home stating his doctor referred him here for possible abnormal blood work that he had done for his heart. Pt denies any CP at this time but states he has mid back pain.

## 2022-11-26 NOTE — ED Notes (Signed)
ED TO INPATIENT HANDOFF REPORT  ED Nurse Name and Phone #: [phone]   S Name/Age/Gender Roy Andrews 52 y.o. male Room/Bed: 034C/034C  Code Status   Code Status: Full Code  Home/SNF/Other Home Patient oriented to: self, place, time, and situation Is this baseline? Yes   Triage Complete: Triage complete  Chief Complaint AKI (acute kidney injury) [facility]) [N17.9]  Triage Note Pt arrived POV from home stating his doctor referred him here for possible abnormal blood work that he had done for his heart. Pt denies any CP at this time but states he has mid back pain.    Allergies No Known Allergies  Level of Care/Admitting Diagnosis ED Disposition     ED Disposition  Admit   Condition  --   Comment  Hospital Area: [facility] [[id number]]  Level of Care: Telemetry Medical [104]  May place patient in observation at [facility] or [facility] if equivalent level of care is available:: No  Covid Evaluation: Asymptomatic - no recent exposure (last 10 days) testing not required  Diagnosis: AKI (acute kidney injury) [facility]) [[id number]]  Admitting Physician: Nena Polio  Attending Physician: Nena Polio          B Medical/Surgery History Past Medical History:  Diagnosis Date   Diabetes mellitus without complication (HCC)    Dyslipidemia, goal LDL below 70    Hypertension    Obese    Past Surgical History:  Procedure Laterality Date   NO PAST SURGERIES     right lower leg debridement Right    large puncture wound from sharp rusted metal object at 52 years of age     A IV Location/Drains/Wounds Patient Lines/Drains/Airways Status     Active Line/Drains/Airways     Name Placement date Placement time Site Days   Peripheral IV 11/26/22 20 G Right Antecubital 11/26/22  1322  Antecubital  less than 1   Pressure Injury 10/10/18 Sacrum Mid Stage II -  Partial thickness loss of dermis presenting as a shallow open ulcer with  a red, pink wound bed without slough. 10/10/18  0030  -- 1508   Pressure Injury 10/14/18 Nose Medial Stage II -  Partial thickness loss of dermis presenting as a shallow open ulcer with a red, pink wound bed without slough. pink 10/14/18  2030  -- 1504   Wound / Incision (Open or Dehisced) 10/17/18 Non-pressure wound Face Right pink 10/17/18  0000  Face  1501            Intake/Output Last 24 hours No intake or output data in the 24 hours ending 11/26/22 1744  Labs/Imaging Results for orders placed or performed during the hospital encounter of 11/26/22 (from the past 48 hour(s))  Basic metabolic panel     Status: Abnormal   Collection Time: 11/26/22 11:36 AM  Result Value Ref Range   Sodium 134 (L) 135 - 145 mmol/L   Potassium 5.7 (H) 3.5 - 5.1 mmol/L   Chloride 100 98 - 111 mmol/L   CO2 22 22 - 32 mmol/L   Glucose, Bld 127 (H) 70 - 99 mg/dL    Comment: Glucose reference range applies only to samples taken after fasting for at least 8 hours.   BUN 38 (H) 6 - 20 mg/dL   Creatinine, Ser 1.30 (H) 0.61 - 1.24 mg/dL   Calcium 86.5 (H) 8.9 - 10.3 mg/dL   GFR, Estimated 54 (L) >60 mL/min    Comment: (NOTE) Calculated  using the CKD-EPI Creatinine Equation (2021)    Anion gap 12 5 - 15    Comment: Performed at [facility], [address] [place], [place] [address]  CBC     Status: None   Collection Time: 11/26/22 11:36 AM  Result Value Ref Range   WBC 5.7 4.0 - 10.5 K/uL   RBC 4.84 4.22 - 5.81 MIL/uL   Hemoglobin 14.5 13.0 - 17.0 g/dL   HCT 53.6 64.4 - 03.4 %   MCV 90.5 80.0 - 100.0 fL   MCH 30.0 26.0 - 34.0 pg   MCHC 33.1 30.0 - 36.0 g/dL   RDW 74.2 59.5 - 63.8 %   Platelets 193 150 - 400 K/uL   nRBC 0.0 0.0 - 0.2 %    Comment: Performed at [facility], [address] [place], [place] [address]  Troponin I (High Sensitivity)     Status: None   Collection Time: 11/26/22 11:36 AM  Result Value Ref Range   Troponin I (High Sensitivity) 8 <18 ng/L    Comment:  (NOTE) Elevated high sensitivity troponin I (hsTnI) values and significant  changes across serial measurements may suggest ACS but many other  chronic and acute conditions are known to elevate hsTnI results.  Refer to the "Links" section for chest pain algorithms and additional  guidance. Performed at [facility], [address], [place], [place] [address]   D-dimer, quantitative     Status: None   Collection Time: 11/26/22  1:20 PM  Result Value Ref Range   D-Dimer, Quant 0.37 0.00 - 0.50 ug/mL-FEU    Comment: (NOTE) At the manufacturer cut-off value of 0.5 g/mL FEU, this assay has a negative predictive value of 95-100%.This assay is intended for use in conjunction with a clinical pretest probability (PTP) assessment model to exclude pulmonary embolism (PE) and deep venous thrombosis (DVT) in outpatients suspected of PE or DVT. Results should be correlated with clinical presentation. Performed at [facility], [address], [place], [place] [address]   Brain natriuretic peptide     Status: None   Collection Time: 11/26/22  1:20 PM  Result Value Ref Range   B Natriuretic Peptide 37.3 0.0 - 100.0 pg/mL    Comment: Performed at [facility], [address], [place], [place] [address]  Troponin I (High Sensitivity)     Status: None   Collection Time: 11/26/22  1:20 PM  Result Value Ref Range   Troponin I (High Sensitivity) 8 <18 ng/L    Comment: (NOTE) Elevated high sensitivity troponin I (hsTnI) values and significant  changes across serial measurements may suggest ACS but many other  chronic and acute conditions are known to elevate hsTnI results.  Refer to the "Links" section for chest pain algorithms and additional  guidance. Performed at [facility], [address] [place], [place] [address]    CT Angio Chest PE W and/or Wo Contrast  Result Date: 11/26/2022 CLINICAL DATA:  Chest pain, abnormal chest radiograph, dyspnea * Tracking Code: BO *  EXAM: CT ANGIOGRAPHY CHEST WITH CONTRAST TECHNIQUE: Multidetector CT imaging of the chest was performed using the standard protocol during bolus administration of intravenous contrast. Multiplanar CT image reconstructions and MIPs were obtained to evaluate the vascular anatomy. RADIATION DOSE REDUCTION: This exam was performed according to the departmental dose-optimization program which includes automated exposure control, adjustment of the mA and/or kV according to patient size and/or use of iterative reconstruction  technique. CONTRAST:  75mL OMNIPAQUE IOHEXOL 350 MG/ML SOLN COMPARISON:  None Available. FINDINGS: Cardiovascular: Satisfactory opacification of the pulmonary arteries to the segmental level. No evidence of pulmonary embolism. Normal heart size. Enlargement of the main pulmonary artery measuring up to 3.8 cm in caliber. No pericardial effusion. Mediastinum/Nodes: Numerous bulky mediastinal and bilateral hilar lymph nodes, largest right hilar node measuring up to 2.8 x 2.7 cm (series 2, image 66). Thyroid gland, trachea, and esophagus demonstrate no significant findings. Lungs/Pleura: Somewhat spiculated appearing nodular opacity in the posterior right middle lobe abutting the major fissure measuring 1.9 x 1.3 cm (series 6, image 75). Fine ground-glass and nodular airspace opacities scattered throughout the lungs. Diffuse bilateral bronchial wall thickening. No pleural effusion or pneumothorax. Upper Abdomen: No acute abnormality. Musculoskeletal: No chest wall abnormality. No acute osseous findings. Review of the MIP images confirms the above findings. IMPRESSION: 1. Negative examination for pulmonary embolism. 2. Numerous bulky mediastinal and bilateral hilar lymph nodes, in keeping with findings of prior chest radiograph. These are highly concerning for lymphoma or nodal metastatic disease, however benign differential considerations include sarcoidosis. 3. Somewhat spiculated appearing nodular  opacity in the posterior right middle lobe abutting the major fissure measuring 1.9 x 1.3 cm. This is nonspecific and may be infectious or inflammatory, however primary lung malignancy is not excluded. 4. Fine ground-glass and nodular airspace opacities scattered throughout the lungs. Diffuse bilateral bronchial wall thickening. Findings are nonspecific and infectious or inflammatory. Notably, these are not specific in character for pulmonary sarcoidosis per discussion above. 5. Enlargement of the main pulmonary artery, as can be seen in pulmonary hypertension. Electronically Signed   By: Jearld Lesch M.D.   On: 11/26/2022 14:51   DG Chest 2 View  Result Date: 11/26/2022 CLINICAL DATA:  Chest pain. EXAM: CHEST - 2 VIEW COMPARISON:  12/14/2018 FINDINGS: Lateral view is markedly motion degraded. Frontal projection shows upper normal heart size. Interval development of soft tissue fullness in the right hilar region. Lungs otherwise clear. No acute bony abnormality. IMPRESSION: Interval development of soft tissue fullness in the right hilar region. CT chest with contrast recommended to further evaluate. Electronically Signed   By: Kennith Center M.D.   On: 11/26/2022 12:12    Pending Labs Unresulted Labs (From admission, onward)     Start     Ordered   11/27/22 0500  TSH  Tomorrow morning,   R        11/26/22 1705   11/27/22 0500  CBC  Tomorrow morning,   R        11/26/22 1711   11/27/22 0500  Sedimentation rate  Tomorrow morning,   R        11/26/22 1711   11/27/22 0500  Comprehensive metabolic panel  Tomorrow morning,   R        11/26/22 1711   11/27/22 0500  Phosphorus  Tomorrow morning,   R        11/26/22 1711   11/27/22 0500  VITAMIN D 25 Hydroxy (Vit-D Deficiency, Fractures)  Tomorrow morning,   R        11/26/22 1711   11/27/22 0500  Calcitriol (1,25 di-OH Vit D)  Tomorrow morning,   R        11/26/22 1711   11/27/22 0500  QuantiFERON-TB Gold Plus  Tomorrow morning,   R        11/26/22  1711   11/27/22 0500  Angiotensin converting enzyme  Tomorrow morning,   R  11/26/22 1731   11/27/22 0500  ANA  Tomorrow morning,   R        11/26/22 1731   11/27/22 0500  Anti-DNA antibody, double-stranded  Tomorrow morning,   R        11/26/22 1731   11/27/22 0500  Rheumatoid factor  Tomorrow morning,   R        11/26/22 1731   11/27/22 0500  CYCLIC CITRUL PEPTIDE ANTIBODY, IGG/IGA  Tomorrow morning,   R        11/26/22 1731   11/27/22 0500  Lactate dehydrogenase  Tomorrow morning,   R        11/26/22 1733   11/27/22 0500  Lactic acid, plasma  Tomorrow morning,   R        11/26/22 1733   11/27/22 0500  Blood gas, arterial  Tomorrow morning,   R        11/26/22 1739   11/26/22 1710  Calcium / creatinine ratio, urine  Once,   R        11/26/22 1711   11/26/22 1710  Na and K (sodium & potassium), rand urine  Once,   R        11/26/22 1711   11/26/22 1708  Hepatic function panel  Add-on,   AD        11/26/22 1711   11/26/22 1707  C-reactive protein  Once,   R        11/26/22 1711   11/26/22 1705  Urinalysis, Complete w Microscopic -Urine, Clean Catch  ONCE - URGENT,   URGENT       Question:  Specimen Source  Answer:  Urine, Clean Catch   11/26/22 1705   11/26/22 1702  HIV Antibody (routine testing w rflx)  (HIV Antibody (Routine testing w reflex) panel)  Once,   R        11/26/22 1705            Vitals/Pain Today's Vitals   11/26/22 1615 11/26/22 1645 11/26/22 1700 11/26/22 1730  BP: (!) 100/48 114/60 110/64 98/62  Pulse: 82 75 73 72  Resp: (!) 21 (!) 21 (!) 21 17  Temp:      TempSrc:      SpO2: 94% 96% 94% 93%  Weight:      Height:      PainSc:        Isolation Precautions No active isolations  Medications Medications  sodium chloride 0.9 % bolus 500 mL (has no administration in time range)  atorvastatin (LIPITOR) tablet 40 mg (has no administration in time range)  enoxaparin (LOVENOX) injection 75 mg (has no administration in time range)  sodium  chloride flush (NS) 0.9 % injection 3 mL (has no administration in time range)  acetaminophen (TYLENOL) tablet 650 mg (has no administration in time range)    Or  acetaminophen (TYLENOL) suppository 650 mg (has no administration in time range)  senna-docusate (Senokot-S) tablet 1 tablet (has no administration in time range)  albuterol (PROVENTIL) (2.5 MG/3ML) 0.083% nebulizer solution 2.5 mg (has no administration in time range)  metFORMIN (GLUCOPHAGE-XR) 24 hr tablet 500 mg (500 mg Oral Given 11/26/22 1305)  glipiZIDE (GLUCOTROL) tablet 10 mg (10 mg Oral Given 11/26/22 1305)  gabapentin (NEURONTIN) capsule 300 mg (300 mg Oral Given 11/26/22 1305)  sodium zirconium cyclosilicate (LOKELMA) packet 10 g (10 g Oral Given 11/26/22 1305)  aspirin EC tablet 81 mg (81 mg Oral Given 11/26/22 1305)  empagliflozin (JARDIANCE) tablet 10  mg (10 mg Oral Given 11/26/22 1305)  iohexol (OMNIPAQUE) 350 MG/ML injection 75 mL (75 mLs Intravenous Contrast Given 11/26/22 1436)    Mobility walks with device     Focused Assessments Cardiac Assessment Handoff:    Lab Results  Component Value Date   CKTOTAL 1,256 (H) 10/10/2018   TROPONINI <0.03 10/10/2018   Lab Results  Component Value Date   DDIMER 0.37 11/26/2022   Does the Patient currently have chest pain? Yes    R Recommendations: See Admitting Provider Note  Report given to:   Additional Notes:

## 2022-11-26 NOTE — Progress Notes (Signed)
*  PRELIMINARY RESULTS* Echocardiogram 2D Echocardiogram has been performed.  Laddie Aquas 11/26/2022, 6:06 PM

## 2022-11-26 NOTE — Progress Notes (Signed)
   11/26/22 2200  BiPAP/CPAP/SIPAP  $ Non-Invasive Home Ventilator  Initial  $ Face Mask Medium Yes  BiPAP/CPAP/SIPAP Pt Type Adult  BiPAP/CPAP/SIPAP Resmed  Reason BIPAP/CPAP not in use Non-compliant  Mask Type Nasal mask  Mask Size Medium  EPAP  (max 16, min 5)  Patient Home Equipment No  Auto Titrate Yes  BiPAP/CPAP /SiPAP Vitals  Pulse Rate 90  Resp 18  MEWS Score/Color  MEWS Score 0  MEWS Score Color Green   Patient unable to tolerate CPAP. Pulled off as soon as I placed it on patient. RN aware.

## 2022-11-26 NOTE — Inpatient Diabetes Management (Signed)
Inpatient Diabetes Program Recommendations  AACE/ADA: New Consensus Statement on Inpatient Glycemic Control  Target Ranges:  Prepandial:   less than 140 mg/dL      Peak postprandial:   less than 180 mg/dL (1-2 hours)      Critically ill patients:  140 - 180 mg/dL    Latest Reference Range & Units 11/23/22 14:33 11/24/22 16:25 11/26/22 11:36  Glucose 70 - 99 mg/dL 253 (H) 664 (H) 403 (H)    Latest Reference Range & Units 08/26/22 08:48 08/29/22 12:14 11/23/22 14:33  Hemoglobin A1C 4.8 - 5.6 % 13.4 (H) 13.4 (H) 8.9 (H)   Review of Glycemic Control  Diabetes history: DM2 Outpatient Diabetes medications: Jardiance 10 mg QAM, Glipizide 10 mg BID, Metformin XR 1000 mg BID, Mounjaro 2.5 mg Qweek Current orders for Inpatient glycemic control: Novolog 0-20 units TID with meals, Novolog 0-5 units QHS  NOTE: Noted consult for Diabetes Coordinator. Diabetes Coordinator is not on campus over the weekend but available by pager from 8am to 5pm for questions or concerns. Chart reviewed. Patient admitted with AKI, hyperkalemia mediastinal LAD, dyspnea on exertion, atypical chest pain, and RML nodule. In reviewing chart, noted patient goes to [facility] and was last seen 11/23/22. Noted A1C improved from 13.4% on 08/29/22 to 8.9% on 11/23/22. Patient received Jardiance, Glipizide, and Metformin this morning but no longer ordered. Will follow along and make further recommendations if needed based on glucose data.  Thanks, Orlando Penner, RN, MSN, CDCES Diabetes Coordinator Inpatient Diabetes Program [phone] (Team Pager from 8am to 5pm)

## 2022-11-26 NOTE — ED Provider Notes (Signed)
[place] EMERGENCY DEPARTMENT AT [facility] Provider Note   CSN: [id number] Arrival date & time: 11/26/22  1118     History  Chief Complaint  Patient presents with   Abnormal Lab   Back Pain    [facility] Karrie Meres Chales Salmon is a 52 y.o. male.  52 yo M with hx of elevated BMI, HTN, HLD, and DM who presented to the emergency department with elevated troponin. Having a small amount of back pain and some discomfort between his shoulder blades. This morning when waking up. Been a month where he has been feeling fatigued and when he goes to walk around. Has pain on back of lungs for past month.  Says that this is bilateral.  Occasionally worse with exertion.  Does have occasional shortness of breath.  Has been getting dizzy and feeling like he will pass out occasionally with exertion.  No diaphoresis or vomiting.  For the past 2 to 3 days has been having leg swelling left greater than right.  On 11/24/2022 had troponin drawn that was 27.  Primary doctor referred him into the emergency department and told him to see a cardiologist.  Does not have any personal or immediate family history of MI.  No tobacco use.  History obtained via Spanish interpreter       Home Medications Prior to Admission medications   Medication Sig Start Date End Date Taking? Authorizing Provider  AgaMatrix Ultra-Thin Lancets MISC Check blood glucose twice daily before meals 09/28/21   Julieanne Manson, MD  aspirin EC 81 MG tablet Take 1 tablet (81 mg total) by mouth daily. Swallow whole. 11/23/22   Julieanne Manson, MD  atorvastatin (LIPITOR) 40 MG tablet 2 tabs by mouth daily. 11/26/22   Julieanne Manson, MD  blood glucose meter kit and supplies Dispense based on patient and insurance preference. Use up to four times daily as directed. (FOR ICD-10 E10.9, E11.9). 10/22/18   Ghimire, Werner Lean, MD  Blood Glucose Monitoring Suppl (AGAMATRIX PRESTO) w/Device KIT Check blood glucose twice daily before  meals 09/28/21   Julieanne Manson, MD  calcium carbonate (TUMS - DOSED IN MG ELEMENTAL CALCIUM) 500 MG chewable tablet Chew 1 tablet by mouth as needed for indigestion or heartburn. Patient not taking: Reported on 08/29/2022    [provider]  carvedilol (COREG) 3.125 MG tablet Take 1 tablet (3.125 mg total) by mouth 2 (two) times daily with a meal. 11/23/22   Julieanne Manson, MD  empagliflozin (JARDIANCE) 10 MG TABS tablet Take 1 tablet (10 mg total) by mouth daily before breakfast. 08/29/22   Julieanne Manson, MD  gabapentin (NEURONTIN) 100 MG capsule 3 caps by mouth 3 times daily 11/23/22   Julieanne Manson, MD  glipiZIDE (GLUCOTROL) 10 MG tablet 1 tab by mouth twice daily with meals 01/03/22   Julieanne Manson, MD  glucose blood (AGAMATRIX PRESTO TEST) test strip Check blood glucose twice daily before meals. 09/28/21   Julieanne Manson, MD  ibuprofen (ADVIL) 600 MG tablet Take 1 tablet (600 mg total) by mouth every 8 (eight) hours as needed. 01/17/19   Fulp, Cammie, MD  lisinopril (ZESTRIL) 20 MG tablet Take 1 tablet (20 mg total) by mouth daily. 01/03/22   Julieanne Manson, MD  loratadine (CLARITIN) 10 MG tablet Take 1 tablet (10 mg total) by mouth daily. Patient not taking: Reported on 08/29/2022 01/03/22   Julieanne Manson, MD  metFORMIN (GLUCOPHAGE-XR) 500 MG 24 hr tablet 2 tabs by mouth twice daily with meals 01/03/22   Mulberry,  Lanora Manis, MD  nitroGLYCERIN (NITROSTAT) 0.4 MG SL tablet Place 1 tablet (0.4 mg total) under the tongue every 5 (five) minutes as needed for chest pain. 11/23/22   Julieanne Manson, MD  Omega-3 Fatty Acids (FISH OIL) 1000 MG CAPS 2 caps by mouth twice daily Patient not taking: Reported on 11/23/2022 08/29/22   Julieanne Manson, MD  tirzepatide [facility]) 2.5 MG/0.5ML Pen Inject 2.5 mg into the skin once a week. 11/26/22   Julieanne Manson, MD      Allergies    Patient has no known allergies.    Review of Systems   Review of  Systems  Physical Exam Updated Vital Signs BP 116/64 (BP Location: Left Arm)   Pulse 92   Temp 98.8 F (37.1 C)   Resp 18   Ht 5\' 11"  (1.803 m)   Wt (!) 156.2 kg   SpO2 92%   BMI 48.03 kg/m  Physical Exam Vitals and nursing note reviewed.  Constitutional:      General: He is not in acute distress.    Appearance: He is well-developed.  HENT:     Head: Normocephalic and atraumatic.     Right Ear: External ear normal.     Left Ear: External ear normal.     Nose: Nose normal.  Eyes:     Extraocular Movements: Extraocular movements intact.     Conjunctiva/sclera: Conjunctivae normal.     Pupils: Pupils are equal, round, and reactive to light.  Cardiovascular:     Rate and Rhythm: Normal rate and regular rhythm.     Heart sounds: Normal heart sounds.  Pulmonary:     Effort: Pulmonary effort is normal. No respiratory distress.     Breath sounds: Normal breath sounds.  Abdominal:     General: There is no distension.     Palpations: Abdomen is soft. There is no mass.     Tenderness: There is no abdominal tenderness. There is no guarding.  Musculoskeletal:     Cervical back: Normal range of motion and neck supple.     Right lower leg: Edema present.     Left lower leg: Edema present.  Skin:    General: Skin is warm and dry.  Neurological:     Mental Status: He is alert. Mental status is at baseline.  Psychiatric:        Mood and Affect: Mood normal.        Behavior: Behavior normal.     ED Results / Procedures / Treatments   Labs (all labs ordered are listed, but only abnormal results are displayed) Labs Reviewed  BASIC METABOLIC PANEL - Abnormal; Notable for the following components:      Result Value   Sodium 134 (*)    Potassium 5.7 (*)    Glucose, Bld 127 (*)    BUN 38 (*)    Creatinine, Ser 1.56 (*)    Calcium 10.6 (*)    GFR, Estimated 54 (*)    All other components within normal limits  URINALYSIS, COMPLETE (UACMP) WITH MICROSCOPIC - Abnormal; Notable  for the following components:   Glucose, UA >=500 (*)    All other components within normal limits  CBC  D-DIMER, QUANTITATIVE  BRAIN NATRIURETIC PEPTIDE  HIV ANTIBODY (ROUTINE TESTING W REFLEX)  C-REACTIVE PROTEIN  HEPATIC FUNCTION PANEL  NA AND K (SODIUM & POTASSIUM), RAND UR  TSH  CBC  SEDIMENTATION RATE  COMPREHENSIVE METABOLIC PANEL  PHOSPHORUS  VITAMIN D 25 HYDROXY (VIT D DEFICIENCY, FRACTURES)  CALCITRIOL (  1,25 DI-OH VIT D)  CALCIUM / CREATININE RATIO, URINE  QUANTIFERON-TB GOLD PLUS  ANGIOTENSIN CONVERTING ENZYME  ANA  ANTI-DNA ANTIBODY, DOUBLE-STRANDED  RHEUMATOID FACTOR  CYCLIC CITRUL PEPTIDE ANTIBODY, IGG/IGA  LACTATE DEHYDROGENASE  LACTIC ACID, PLASMA  BLOOD GAS, ARTERIAL  GAMMA GT  TROPONIN I (HIGH SENSITIVITY)  TROPONIN I (HIGH SENSITIVITY)    EKG EKG Interpretation Date/Time:  Saturday November 26 2022 11:29:41 EDT Ventricular Rate:  85 PR Interval:  144 QRS Duration:  84 QT Interval:  320 QTC Calculation: 380 R Axis:   100  Text Interpretation: Normal sinus rhythm Possible Right ventricular hypertrophy Abnormal ECG Confirmed by Vonita Moss [phone]) on 11/26/2022 12:56:06 PM  Radiology US RENAL  Result Date: 11/26/2022 CLINICAL DATA:  Acute kidney injury. EXAM: RENAL / URINARY TRACT ULTRASOUND COMPLETE COMPARISON:  Same day chest CT. FINDINGS: Right Kidney: Renal measurements: 12.0 x 5.2 x 5.0 cm = volume: 160 mL. Echogenicity within normal limits. No mass or hydronephrosis visualized. Left Kidney: Renal measurements: 12.3 x 6.0 x 6.4 cm = volume: 244 mL. Echogenicity within normal limits. No mass or hydronephrosis visualized. Bladder: Appears normal for degree of bladder distention. Other: None. IMPRESSION: No hydronephrosis. Electronically Signed   By: Romona Curls M.D.   On: 11/26/2022 18:51   CT Angio Chest PE W and/or Wo Contrast  Result Date: 11/26/2022 CLINICAL DATA:  Chest pain, abnormal chest radiograph, dyspnea * Tracking Code: BO * EXAM: CT  ANGIOGRAPHY CHEST WITH CONTRAST TECHNIQUE: Multidetector CT imaging of the chest was performed using the standard protocol during bolus administration of intravenous contrast. Multiplanar CT image reconstructions and MIPs were obtained to evaluate the vascular anatomy. RADIATION DOSE REDUCTION: This exam was performed according to the departmental dose-optimization program which includes automated exposure control, adjustment of the mA and/or kV according to patient size and/or use of iterative reconstruction technique. CONTRAST:  75mL OMNIPAQUE IOHEXOL 350 MG/ML SOLN COMPARISON:  None Available. FINDINGS: Cardiovascular: Satisfactory opacification of the pulmonary arteries to the segmental level. No evidence of pulmonary embolism. Normal heart size. Enlargement of the main pulmonary artery measuring up to 3.8 cm in caliber. No pericardial effusion. Mediastinum/Nodes: Numerous bulky mediastinal and bilateral hilar lymph nodes, largest right hilar node measuring up to 2.8 x 2.7 cm (series 2, image 66). Thyroid gland, trachea, and esophagus demonstrate no significant findings. Lungs/Pleura: Somewhat spiculated appearing nodular opacity in the posterior right middle lobe abutting the major fissure measuring 1.9 x 1.3 cm (series 6, image 75). Fine ground-glass and nodular airspace opacities scattered throughout the lungs. Diffuse bilateral bronchial wall thickening. No pleural effusion or pneumothorax. Upper Abdomen: No acute abnormality. Musculoskeletal: No chest wall abnormality. No acute osseous findings. Review of the MIP images confirms the above findings. IMPRESSION: 1. Negative examination for pulmonary embolism. 2. Numerous bulky mediastinal and bilateral hilar lymph nodes, in keeping with findings of prior chest radiograph. These are highly concerning for lymphoma or nodal metastatic disease, however benign differential considerations include sarcoidosis. 3. Somewhat spiculated appearing nodular opacity in the  posterior right middle lobe abutting the major fissure measuring 1.9 x 1.3 cm. This is nonspecific and may be infectious or inflammatory, however primary lung malignancy is not excluded. 4. Fine ground-glass and nodular airspace opacities scattered throughout the lungs. Diffuse bilateral bronchial wall thickening. Findings are nonspecific and infectious or inflammatory. Notably, these are not specific in character for pulmonary sarcoidosis per discussion above. 5. Enlargement of the main pulmonary artery, as can be seen in pulmonary hypertension. Electronically Signed   By:  Jearld Lesch M.D.   On: 11/26/2022 14:51   DG Chest 2 View  Result Date: 11/26/2022 CLINICAL DATA:  Chest pain. EXAM: CHEST - 2 VIEW COMPARISON:  12/14/2018 FINDINGS: Lateral view is markedly motion degraded. Frontal projection shows upper normal heart size. Interval development of soft tissue fullness in the right hilar region. Lungs otherwise clear. No acute bony abnormality. IMPRESSION: Interval development of soft tissue fullness in the right hilar region. CT chest with contrast recommended to further evaluate. Electronically Signed   By: Kennith Center M.D.   On: 11/26/2022 12:12    Procedures Procedures    Medications Ordered in ED Medications  atorvastatin (LIPITOR) tablet 40 mg (40 mg Oral Given 11/26/22 1815)  enoxaparin (LOVENOX) injection 75 mg (75 mg Subcutaneous Given 11/26/22 1813)  sodium chloride flush (NS) 0.9 % injection 3 mL (has no administration in time range)  acetaminophen (TYLENOL) tablet 650 mg (has no administration in time range)    Or  acetaminophen (TYLENOL) suppository 650 mg (has no administration in time range)  senna-docusate (Senokot-S) tablet 1 tablet (has no administration in time range)  albuterol (PROVENTIL) (2.5 MG/3ML) 0.083% nebulizer solution 2.5 mg (has no administration in time range)  insulin aspart (novoLOG) injection 0-20 Units (has no administration in time range)  insulin aspart  (novoLOG) injection 0-5 Units (has no administration in time range)  metFORMIN (GLUCOPHAGE-XR) 24 hr tablet 500 mg (500 mg Oral Given 11/26/22 1305)  glipiZIDE (GLUCOTROL) tablet 10 mg (10 mg Oral Given 11/26/22 1305)  gabapentin (NEURONTIN) capsule 300 mg (300 mg Oral Given 11/26/22 1305)  sodium zirconium cyclosilicate (LOKELMA) packet 10 g (10 g Oral Given 11/26/22 1305)  aspirin EC tablet 81 mg (81 mg Oral Given 11/26/22 1305)  empagliflozin (JARDIANCE) tablet 10 mg (10 mg Oral Given 11/26/22 1305)  iohexol (OMNIPAQUE) 350 MG/ML injection 75 mL (75 mLs Intravenous Contrast Given 11/26/22 1436)  sodium chloride 0.9 % bolus 500 mL (500 mLs Intravenous New Bag/Given 11/26/22 1818)    ED Course/ Medical Decision Making/ A&P Clinical Course as of 11/26/22 2119  Sat Nov 26, 2022  1254 Creatinine(!): 1.56 Baseline of 1.0 [RP]  1504 CT Angio Chest PE W and/or Wo Contrast No PE. Concerns for lymphoma vs nodal metastatic disease, vs pulmonary sarcoidosis with spiculated lung in the right middle lobe. Non-specific ground glass opacities. Possible pulmonary hypertension.  [RP]  1606 Dr Lily Kocher from internal medicine teaching service to admit the patient. [RP]    Clinical Course User Index [RP] Rondel Baton, MD                             Medical Decision Making Amount and/or Complexity of Data Reviewed Labs: ordered. Decision-making details documented in ED Course. Radiology: ordered. Decision-making details documented in ED Course.  Risk OTC drugs. Prescription drug management. Decision regarding hospitalization.   [facility] Karrie Meres Chales Salmon is a 52 y.o. male with comorbidities that complicate the patient evaluation including elevated BMI, HTN, HLD, and DM who presented to the emergency department with elevated troponin, upper back pain, fatigue, and shortness of breath  Initial Ddx:  MI, PE, heart failure, pneumonia, URI  MDM/Course:  Patient presented to the emergency department  with upper back pain, elevated troponin, fatigue, and shortness of breath.  On exam was satting well on room air did not have any wheezing or abnormal lung sounds.  Patient had EKG and serial troponins that were unremarkable.  Does appear  that they were elevated several days ago so unclear would be causing this change.  Also had labs drawn that shows he has a worsening of his renal function over the past year.  Does have an elevated BUN.  Unclear the exact chronicity of this but with his elevated BUN and potassium concerned that it may be an AKI and admitted him to medicine to ensure that this did not worsen.  Was given some IV fluids in the emergency department after his BNP was unremarkable and his chest x-ray did not show any evidence of pulmonary edema.  His chest x-ray did show some perihilar fullness and so he had a CTA to ensure that he did not have a pulmonary embolism as well.  It did show enlarged lymph nodes that were concerning for possible lymphoma, sarcoidosis, or metastatic disease.  CT scan did also show a large nodule in his lungs that could be primary malignancy.  Upon re-evaluation patient remained stable on room air.  These findings were discussed with the patient and his family and did shared decision making regarding admission versus outpatient workup and they requested to be admitted at this time.  Feel this is reasonable with his AKI and to expedite his workup for his lung masses.  Discussed with pulmonology as well who will follow him up as an inpatient.  This patient presents to the ED for concern of complaints listed in HPI, this involves an extensive number of treatment options, and is a complaint that carries with it a high risk of complications and morbidity. Disposition including potential need for admission considered.   Dispo: Admit to Floor  Additional history obtained from family Records reviewed Outpatient Clinic Notes The following labs were independently interpreted:  Chemistry and show AKI I independently reviewed the following imaging with scope of interpretation limited to determining acute life threatening conditions related to emergency care: Chest x-ray and agree with the radiologist interpretation with the following exceptions: none I personally reviewed and interpreted cardiac monitoring: normal sinus rhythm  I personally reviewed and interpreted the pt's EKG: see above for interpretation  I have reviewed the patients home medications and made adjustments as needed Consults: Hospitalist and pulmonology Social Determinants of health:  Spanish-speaking          Final Clinical Impression(s) / ED Diagnoses Final diagnoses:  Pulmonary nodule  Hilar lymphadenopathy  Shortness of breath    Rx / DC Orders ED Discharge Orders     None         Rondel Baton, MD 11/26/22 2119

## 2022-11-26 NOTE — Hospital Course (Addendum)
Summary:  Roy Andrews is a 52 yo Spanish speaking person with a  significant medical history of uncontrolled DM Type 2, HTN, HLD, Obesity, OSA not currently using CPAP, prior COVID infection requiring ICU admission and 4 months of supplemental O2 requirement, who was sent to the ED by his PCP and admitted on 7/20 after evaluation at clinic appointment revealed abnormal labs during evaluation for dyspnea on exertion and bilateral midback pain. Patient denies any former or current tobacco use.   AKI with hyperkalemia BUN/Cr >20 pre-renal w/ intrarenal and postobstructive causes contributing. Recently started on Jardiance. S/p Lokelma, EKG and telemetry stable. Improving. Patient with longstanding hypertension with intermittent adherence to outpatient medications for blood pressure and diabetes. Patient recently started on Jardiance and recently taking all prescribed medications, some renally cleared, at around the same time (~3 weeks ago). Patient received intravenous contrast for imaging as well. Renal ultrasound 7/20 showing no mass or hydronephrosis, echogenicity within normal limits, no mass or hydronephrosis visualized. Only recently started treating diabetes more consistently, A1C 4/22 13.4, A1C 7/17 8.9.    Continue holding nephrotoxic and renally cleared medications. Given one 500 NS bolus 7/20. Tele unremarkable. Cr. 1.73 (1.65 on admission, 1.65 yesterday). Creatinine 5.23.23 and 03/15/2021 of 0.95. Continue to encourage PO intake of water/fluids. Potassium stable 4.1.   Sodium initially trending down 134 > 132, > 133 > 130 > 133. Today sodium 130. Patient reported trying to eating regular meals while here. No edema b/l lower extremities. Will monitor. Encouraged PO intake.   Mediastinal LAD, hypercalcemia 1 mo hx worsening dyspnea; now with CT showing mediastinal and bilateral LN enlargement c/f sarcoidosis, lymphoma, metastatic disease. Ddx TB (from [place]), glomerulonephritis,  sarcoidosis, inflammatory etiologies. PULM/CRIT spoke with patient 7/22 to discuss lung biopsy, they will discuss and arrange, scheduled 7/26.  Acute worsening DOE TTE wnl. OSA not on CPAP, Pulm HTN potentially contributing. Currently no CP or discomfort.  RML nodule Spiculated nodular opacity in P R middle lobe 1.9 x 1.3 cm seen on CT chest PE study. Ddx concerning for infectious vs inflammation vs malignancy. PCCM consulted, follow reccs. Ordered CT abdomen/pelvis 7/22- uncomplicated diverticulosis, fat necrosis distal sacrum right of the midline, no primary or metastatic neoplasm  Anxiety Hydroxyzine PRN, one time Zofran, consider short course ambien for sleep.  Muscle spasms/back pain  HTN Stable on admission; will hold home Lisinopril in setting of AKI; continue Lipitor, monitor BP.  HLD  OSA -------------------------------------- PATIENT INSTRUCTIONS:   - You were seen for acute kidney injury and hypercalcemia on labs, and found to have mediastinal and hilar lymphadenopathy and a right medial lung nodule requiring further workup by a lung specialist.   - Dr. Levy Pupa, a pulmonologist with [facility] at [facility], saw you while you were in the hospital and ordered special labs and performed a bronchoscopy. You will be notified when the results of these labs and bronchoscopy with biopsies are ready. His clinic phone number is [phone], and the address for his office is [address], [place] [address].   - Dr. Kavin Leech office will call you to schedule an appointment at his clinic. You can call his office at [phone] if you have not heard from them by next Wednesday, July 31.   - You need to make an appointment with your regular doctor, Dr. Julieanne Manson, in the next 1-2 weeks. She can go over the results of the bronchoscopy with you and can follow  up on any necessary labs. Her clinic's phone number is [phone].   - We  have let our care team know that you will need some resources and they have passed on your information to a Physiological scientist. We let them know you prefer Spanish, and they will call you to help figure out hospital bills. You can also ask your doctor, Dr. Delrae Alfred, for a medication assistance program IF you end up requiring any new medical treatments.  - Your blood pressures have been good, 114/66, 106/62, 110/65. This may be due to your diet being different (less salt, less fat) in the hospital. Please check your BP at your next doctor visit and discuss the need for continued medications.   - We added a new medication, "Methocarbamol/Robaxin" for your back muscle spasms. You can take one pill every 6 hours as needed.   - Your fasting blood sugars have been 163, 140, 120, 128, 111, 169, and 119. Your random blood sugars have been 127, 96, 102, 123, 100, and 134. You have been getting insulin units (3-9). Please follow up your diabetes medications with Dr. Delrae Alfred at your next appointment.   - Please call our clinic during office hours (8:00 AM - 5 PM) at [phone] if you have any questions regarding your care. Please call 911 if you are experiencing a medical emergency like chest pain, shortness of breath, coughing blood, you are too nauseous to eat or drink, or you feel weak or lightheaded.   It was a pleasure serving you during your stay with [place]! - Dr. Denton Brick and the [facility] Internal Medicine team.     INSTRUCCIONES PARA EL PACIENTE:   Roy Andrews atendieron por una lesin aguda de los riones y calcio elevado en los examenes de laboratorio que le hicieron, y se Clinical cytogeneticist que tena linfadenopata mediastnica e hiliar y un ndulo pulmonar medial derecho que requera ms estudios por parte de un especialista en pulmones.   - El Dr. Levy Pupa, neumlogo de [facility] en [place], [place] vio mientras que estaba en el hospital, solicit laboratorios especiales y le  realiz una broncoscopia. Se le notificar cuando los resultados de estos laboratorios y la broncoscopia con biopsias estn listos. El nmero de telfono de su Reed [phone] y la direccin de su oficina es [address] [place], [place] [address].   - La oficina de el Dr. Delton Coombes lo llamara para programar una cita en su clinica. Usted puede llamar al [phone] si no lo han contactado el proximo [place], [place] 31. Septiembre 4 11:15 AM.      - Debe programar una cita con su mdico habitual, la Dra. Julieanne Manson, en las prximas 1 o 2 semanas. Ella puede [employer] de la broncoscopia con usted y Education officer, environmental un seguimiento de los anlisis de laboratorio necesarios. El nmero de telfono de su clnica es [phone].   - Le hemos informado a nuestro equipo de atencin que necesitar algunos recursos y le han pasado su informacin a un navegador financiero. Les informamos que prefiere el espaol y lo llamarn para ayudarlo a calcular las facturas del hospital. Tambin puede pedirle a su mdico, la Dra. Mulberry, un programa de asistencia con medicamentos SI ES QUE NECESITA algn tratamiento mdico nuevo.  - Tus presiones arteriales han sido estables sin el medicamento para la alta presin, 114/66, 106/62, 110/65. Esto puede deberse a que su dieta fue diferente (menos sal, menos grasa) en  el hospital. Verifique su presin arterial en su prxima visita con la Dra. Mulberry para que juntos decidan la necesidad de Educational psychologist con los medicamentos de alta presin.   - Agregamos un nuevo medicamento, "Metocarbamol/Robaxin" para los espasmos de los msculos de la espalda. Puede tomar una pastilla de este medicamento cada 6 horas segn sea necesario.   - Sus niveles de azcar en la sangre en ayunas han sido 163, 140, 120, 128, 111, 169 y 119. Sus niveles de International aid/development worker en la sangre durante el da han sido 127, 96, 102, 123, 100 y 134. Ha estado recibiendo unidades de insulina (3- 9).  Por favor haga un seguimiento de sus medicamentos para la diabetes con la Dra. Mulberry en su prxima cita.   - Llame a nuestra clnica durante el horario de oficina (8:00 AM a 5:00 PM) al [phone] si tiene alguna pregunta sobre la atencin que recibi bajo nuestro cuidado. Llame al 911 si tiene una emergencia mdica como dolor en el pecho, dificultad para respirar, tos con [place], si es que tiene demasiadas nuseas para comer o beber, o se siente dbil o aturdido.   Fue un placer atenderlo durante su estancia con nosotros! Le deseamos buena salud en el futuro. - Dra. Ameshia Pewitt Arellano y el equipo de [facility] [place] [facility].  --------------------------------------------------------------------------------------- Meds taking at home:  Atorvastatin/LIPITOR 40 daily  Aspirin 81 mg daily  Carvedilol 3.125 mg BID with meals Jardiance 10 mg daily before breakfast  Gabapentin 100 mg TID Glipizide 10 mg BID  Ibuprofen 600 mg q8h PRN- STOP Lisinopril 20 mg daily  Metformin 500 mg BID with meals Nitroglycerin 0.4 mg every 5 minutes as needed for chest pain  Omega-3 2 caps BID  Tirzepatide 2.5 mg weekly (Mounjaro) Loratadine 20 mg daily for allergies  Meds at hospital:  Atorvastatin/LIPITOR 40 daily Acetaminophen/TYLENOL 650 mg q6H Hydroxyzine/ATARAX 25 mg PRN  Senna-docusate/SENOKOT PRN for constipation  Methocarbamol/ROBAXIN 500 mg q6H PRN for muscle spasms Albuterol/PROVENTIL 2.5 mg q2H PRN   **Med recc pended, thinking he can hold off on BP and diabetes meds and then restart with PCP  **Restart aspirin ok at home?  **Check renal function for restarting gabapentin  **Instructions updated on discharge instructions

## 2022-11-26 NOTE — ED Notes (Signed)
Pt getting a ultra sound at the bedside at present

## 2022-11-26 NOTE — H&P (Cosign Needed)
Date: 11/27/2022               Patient Name:  Roy Andrews MRN: [id number]  DOB: 05-05-1971 Age / Sex: 52 y.o., male   PCP: Julieanne Manson, MD         Medical Service: Internal Medicine Teaching Service         Attending Physician: Dr. Inez Catalina, MD      First Contact: Dr. Philomena Doheny, MD Pager [phone]    Second Contact: Dr. Morene Crocker, MD Pager [phone]         After Hours (After 5p/  First Contact Pager: [phone]  weekends / holidays): Second Contact Pager: [phone]   SUBJECTIVE   Chief Complaint: Dr sent me here for abnormal labs  History of Present Illness: Roy Andrews is a 52 yo Spanish speaking person living with significant medical history of uncontrolled DM, HTN, HLD, Obesity, OSA not currently using cPAPprior COVID infection requiring ICU admission and 4 months of supplemental O2 requirement that was sent to the ED after evaluation at his PCP revealed an abnormal HS troponin after evaluation for dyspnea on exertion and bilateral midback pain  Patient reports that he had been on his usual state of being until ~1 months ago. 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  He initially attributed this to the heat of the summer. However, he has noticed that his breathing and overall fatigue has worsened for the past 2 months. He feels like there is a crushing feeling in his lungs as if he were drawning and causes him to gasp for air. Patient experiences symptoms with incrteased activity (30 mins or more) and describes it as a prickling sensation that moves from the bases of his lungs in the back toward the top of his lungs. This does not feel like musculoskeletal pain in nature. This discomfort, which he refrains from calling it pain, causes him to pause  before he feels so fatigued he is afraid he will pass out. No syncopal episodes, changes in vision, chest pain,  nausea, vomiting, abdominal pain associated with it.  Patient only experience chest fluttering once but reports that this was self limited and has not recurred. It is his lung discomfort that worried him and brought him to his PCP.    Of note, patient also reports that there has been progressive worsening in his lower extremity edema (L>R, which is historic) in the past months around the time he felt the lung discomfort. See below full ROS, but notable is increased fatigue, dry cough, darkening of his lower extremities, and DOE. Patient has also noted a 18lb weight loss in the past 2-3 months. He also endorses worsening orthopnea and PND.   While doing a full ROS, patient also mentioned that he has had difficulty with starting and stopping urinary stream and increase pressure when urinating for the past week. His urine is a lot more foamy, but denies frank blood, dysuria, polyuria, or suprapubic tenderness. He was recently started on Jardiance in the past 15 days. Otherwise no changes to medications.   Patent denies myalgias or over muscle weakness today.  Meds:  Lipitor 40 mg daily for the past 15 days  (hiatus of 3 months) - HLD Lisinopriol 20 mg daily Carvedilol 3.125 mg BID Glipizide 10 mg daily Jardiance 10 mg (for the past 15 days ) Metformin 1000 mg XL daily Gabapentin 300  TID Not on GLP-1 ASA  81 mg daily  Past Medical History June 2020 - COVID hospitalization - with supplementqal oxygen for ~4 months    Social:  Lives With: Souse and 2 child\rren in [place]  Occupation: Futures trader Level of Function: independent for ADLs and iADLs PCP: Thea Alken, MD - [facility] Substances: Denies alcohol, tobacco, or recreational drugs   Family History:  Cardiometabolic disease in both sides of his family  Allergies: None known  Review of Systems: A complete ROS was negative except as per HPI.  Review of Systems  Constitutional:  Positive for fever,  malaise/fatigue and weight loss. Negative for chills and diaphoresis.       Weight loss 18 lb   HENT: Negative.  Negative for congestion, ear discharge, ear pain, nosebleeds, sinus pain and sore throat.   Eyes:  Negative for blurred vision, double vision, photophobia, pain, discharge and redness.  Respiratory:  Positive for cough and shortness of breath. Negative for hemoptysis, sputum production, wheezing and stridor.        Dry cough for the past 2 weeks Air hunger for the past month with exertion Orthopnea, PND +  Gastrointestinal:  Positive for nausea. Negative for abdominal pain, blood in stool, constipation, diarrhea, heartburn and melena.       Nausea in the AM  Genitourinary:  Positive for flank pain. Negative for dysuria, frequency, hematuria and urgency.       1 week history - patient notices that he has had to strain to [employer] with a weak stream at initiation and a lot more foam in his urine but denies dysuria or hematuria   Musculoskeletal:  Positive for back pain. Negative for falls, joint pain, myalgias and neck pain.       Chronic L shoulder pain  Skin:  Negative for itching and rash.  Neurological:  Negative for dizziness, tingling, tremors, sensory change, speech change, focal weakness, seizures and weakness.  Endo/Heme/Allergies: Negative.      OBJECTIVE:   Physical Exam: Vitals:   11/26/22 2355 11/27/22 0513  BP: 106/65 106/71  Pulse: 85 88  Resp: 17 18  Temp: 98.8 F (37.1 C) 98.3 F (36.8 C)  SpO2: 92% 93%    Constitutional: well-appearing male sitting in bed, in no acute distress HENT: normocephalic atraumatic, mucous membranes moist Cardiovascular: regular rate and rhythm, no m/r/g, no JVD Pulmonary/Chest: normal work of breathing on room air, lungs clear to auscultation bilaterally. No crackles present Abdominal: soft,protuberant abdomen, non-tender, non-distended. No asterixis Neurological: alert & oriented x 3, moving all extremities MSK: no gross  abnormalities. Skin: warm and dry. Hemosiderin stained lower extremities with increased darkening along bilateral lower shins. 1+ pitting edema to bilateral ankles L>R Psych: Normal mood and affect  Labs: CBC    Component Value Date/Time   WBC 5.7 11/26/2022 1136   RBC 4.84 11/26/2022 1136   HGB 14.5 11/26/2022 1136   HGB 14.6 11/23/2022 1433   HCT 43.8 11/26/2022 1136   HCT 43.5 11/23/2022 1433   PLT 193 11/26/2022 1136   PLT 201 11/23/2022 1433   MCV 90.5 11/26/2022 1136   MCV 89 11/23/2022 1433   MCH 30.0 11/26/2022 1136   MCHC 33.1 11/26/2022 1136   RDW 13.2 11/26/2022 1136   RDW 13.9 11/23/2022 1433   LYMPHSABS 1.5 11/23/2022 1433   MONOABS 0.7 03/15/2021 1432   EOSABS 0.3 11/23/2022 1433   BASOSABS 0.1 11/23/2022 1433     CMP     Component Value Date/Time   NA 134 (  L) 11/26/2022 1136   NA 132 (L) 11/24/2022 1625   K 5.7 (H) 11/26/2022 1136   CL 100 11/26/2022 1136   CO2 22 11/26/2022 1136   GLUCOSE 127 (H) 11/26/2022 1136   BUN 38 (H) 11/26/2022 1136   BUN 42 (H) 11/24/2022 1625   CREATININE 1.56 (H) 11/26/2022 1136   CALCIUM 10.6 (H) 11/26/2022 1136   PROT 7.6 11/26/2022 1929   PROT 7.7 11/24/2022 1625   ALBUMIN 3.5 11/26/2022 1929   ALBUMIN 4.0 (L) 11/24/2022 1625   AST 21 11/26/2022 1929   ALT 37 11/26/2022 1929   ALKPHOS 108 11/26/2022 1929   BILITOT 0.7 11/26/2022 1929   BILITOT 0.4 11/24/2022 1625   GFRNONAA 54 (L) 11/26/2022 1136   GFRAA 119 12/10/2018 0907    Imaging: CT Angio Chest PE W and/or Wo Contrast  Result Date: 11/26/2022 IMPRESSION: 1. Negative examination for pulmonary embolism. 2. Numerous bulky mediastinal and bilateral hilar lymph nodes, in keeping with findings of prior chest radiograph. These are highly concerning for lymphoma or nodal metastatic disease, however benign differential considerations include sarcoidosis. 3. Somewhat spiculated appearing nodular opacity in the posterior right middle lobe abutting the major fissure  measuring 1.9 x 1.3 cm. This is nonspecific and may be infectious or inflammatory, however primary lung malignancy is not excluded. 4. Fine ground-glass and nodular airspace opacities scattered throughout the lungs. Diffuse bilateral bronchial wall thickening. Findings are nonspecific and infectious or inflammatory. Notably, these are not specific in character for pulmonary sarcoidosis per discussion above. 5. Enlargement of the main pulmonary artery, as can be seen in pulmonary hypertension. Electronically Signed   By: Jearld Lesch M.D.   On: 11/26/2022 14:51   DG Chest 2 View IMPRESSION: Interval development of soft tissue fullness in the right hilar region. CT chest with contrast recommended to further evaluate. Electronically Signed   By: Kennith Center M.D.   On: 11/26/2022 12:12      EKG: personally reviewed my interpretation is normal sinus rhythm with R avis deviation and possible RVH hypertrophy without peaked T waves or Qtc prolongation. No signs of ischemia. . Prior EKG not available on file.  ASSESSMENT & PLAN:   Assessment & Plan by Problem: Principal Problem:   AKI (acute kidney injury) (HCC) Active Problems:   Primary hypertension   Type 2 diabetes mellitus with hyperglycemia, without long-term current use of insulin (HCC)   Class 3 severe obesity due to excess calories with serious comorbidity and body mass index (BMI) of 45.0 to 49.9 in adult [facility])   Dyslipidemia associated with type 2 diabetes mellitus (HCC)   Diabetic peripheral neuropathy associated with type 2 diabetes mellitus (HCC)   Hyperkalemia   Mediastinal lymphadenopathy   OSA (obstructive sleep apnea)   Hypercalcemia   Elevated alkaline phosphatase level   AKI Hyperkalemia Patient with longstanding hypertension with intermittent adherence to outpatient medications initially evaluated for chest pain, ACS ruled out in the ED found to have AKI with hyperkalemia. BUN/Cr >20 suggesting pre-renal etiology. However,  intrarenal and postobstructive causes for which patient has +ROS likely contributing. Of note, patient was recently started on Jardiance. Will Hold nephrotoxic and renally cleared medications at this tim. He is now s/p Lokelma with no hyperacute QRS or T changes on EKG or telemetry. Will gently bolus him with 500 cc NS for now and re-evaluate in the AM. - Lokelma PRN  - Telemetry monitoring - U/A with microscopy - Urine lytes - Renal US  Mediastinal LAD Bilateral hilar  LAD Hypercalcemia  Dyspnea on exertion OSA not on cPAP Patient with progressive worsening in dyspnea over the past month and +ROS for B symptoms. Now with CT chest findings of mediastinal and bilateral LN enlargement concerning for sarcoidosis, lymphoma, metastatic disease, which could be contributing to worsening dyspnea. Other differentials include TB (patient has been in the [place] for the past 21 years, emigrated from [place]). With recent history of worsening renal function, will assess for glomerulonephritis as well. No history of prior colonoscopy. Review of his labs one year ago without sign of hypercalcemia now present on repeat studies for the past 3 days. PCCM consulted in the ED; consult to follow. Appreciate their evaluation and recommendations. For now, will work up for sarcoidosis, TB, and inflammatory etiologies.  -ANA, ESR, CRP, HIV -Consider DS RNA -Vit D 1.25, 25 -Serum ACE -Monitor respiratory status and telemetry -500 cc bolus NS  Dyspnea on exertion Atypical chest pain OSA not on cPA Pulmonary HTN Differential diagnosis above potentially contributing to DOE. However, patient with acute worsening in DOE and LE edema also concerning for cardiac etiologies such as acute heart failure. OSA is untreated with CTA with findings suggestive of pulmonary HTN. His VSS at this time, but I think it is sensible to include TTE in the current work up. Though initially mildly elevated troponin at his PCP, ACS has been ruled  out at admission. Patient currently denies chest pain or discomfort. BNP normal at this time, but could be normal in setting of BMI. Will work up as below and continue to monitor fluid status. - Follow up TTE - Monitor fluid status - Will hold off on trialing diuresis in setting of AKI - Daily weights and I/O  RML nodule spiculated appearing nodular opacity in the posterior right middle lobe abutting the major fissure measuring 1.9 x 1.3 cm seen on CT chest PE study. Ddx concerning for infectious vs inflammation vs malignancy. Patient does not carry a former tobacco use. - PCCM consulted by ED provider, appreciate recommendations - May need further imaging with PET or EBUS, perhaps during this admission given social determinant of health limiting outpatient follow up  Elevated alkaline phosphatase No changes in liver enzymes.  -Check vitamin D levels and GGT at this time -Will consider RUQ  HTN HLD Stable on admission. Will hold home Lisinopril in setting of AKI -Monitor BP -Continue Lipitor 40 mg daily  Type 2 DM  Non insulin dependent. A1c 8.9 3 days ago. - Hold home medications - SSI severe  Diet: Carb-Modified with fluid restriction VTE: Enoxaparin Code: Full  Prior to Admission Living Arrangement: Home, living family Anticipated Discharge Location: Home Barriers to Discharge: work up and clinical improvement Dispo: Admit patient to Observation with expected length of stay less than 2 midnights.  Signed:   Morene Crocker, MD Internal Medicine Resident PGY-2 11/27/2022, 6:35 AM   Please contact the on call pager after 5 pm and on weekends at [phone].

## 2022-11-26 NOTE — Consult Note (Signed)
NAME:  Roy Andrews, MRN:  [id number], DOB:  Sep 10, 1970, LOS: 0 ADMISSION DATE:  11/26/2022, CONSULTATION DATE:  11/26/2022 REFERRING MD:  Dr. Criselda Peaches - IMTS  CHIEF COMPLAINT:  Abnormal CT   History of Present Illness:  Roy Andrews is a 52 y.o.-male with a past medical history significant for diabetes, HTN, and HLD who presented to the ED from PCP due to abnormal labs, including elevated high-sensitivity troponin..  Patient denied any chest pain but did report mild back pain with Discomfort mostly located between her shoulder blades.  Also reports fatigue, intermittent dyspnea, and pleuritic pain.  On ED arrival patient was seen hemodynamically stable without signs of hypoxia. Labor significant for AKI and mild hyponatremia and hyperkalemia and HS trop 27. CTA chest was negative for PE but significant for mediastinal and hilar lymphadenopathy concerning for malignancy or  possible sarcoidosis. PCCM consulted for further workup.   Son Cletis [place] Wife [facility]  He is in AKI  creat 1.6 (baseline 0.95) K 5.7 CBC ok Trop 27 -> 8   Pertinent  Medical History    has a past medical history of Diabetes mellitus without complication (HCC), Dyslipidemia, goal LDL below 70, Hypertension, and Obese.   reports that he has never smoked. He has never been exposed to tobacco smoke. He has never used smokeless tobacco.  Past Surgical History:  Procedure Laterality Date   NO PAST SURGERIES     right lower leg debridement Right    large puncture wound from sharp rusted metal object at 52 years of age    No Known Allergies  Immunization History  Administered Date(s) Administered   Tdap 09/06/2022    Family History  Problem Relation Age of Onset   Diabetes Mother    Glucose-6-phos deficiency Daughter      Current Facility-Administered Medications:    acetaminophen (TYLENOL) tablet 650 mg, 650 mg, Oral, Q6H PRN **OR** acetaminophen (TYLENOL) suppository 650 mg, 650 mg,  Rectal, Q6H PRN, Morene Crocker, MD   albuterol (PROVENTIL) (2.5 MG/3ML) 0.083% nebulizer solution 2.5 mg, 2.5 mg, Nebulization, Q2H PRN, Morene Crocker, MD   atorvastatin (LIPITOR) tablet 40 mg, 40 mg, Oral, Daily, Gomez-Caraballo, Maria, MD   enoxaparin (LOVENOX) injection 75 mg, 75 mg, Subcutaneous, Q24H, Gomez-Caraballo, Maria, MD   senna-docusate (Senokot-S) tablet 1 tablet, 1 tablet, Oral, QHS PRN, Morene Crocker, MD   sodium chloride 0.9 % bolus 500 mL, 500 mL, Intravenous, Once, Gomez-Caraballo, Maria, MD   sodium chloride flush (NS) 0.9 % injection 3 mL, 3 mL, Intravenous, Q12H, Morene Crocker, MD  Current Outpatient Medications:    AgaMatrix Ultra-Thin Lancets MISC, Check blood glucose twice daily before meals, Disp: 100 each, Rfl: 11   aspirin EC 81 MG tablet, Take 1 tablet (81 mg total) by mouth daily. Swallow whole., Disp: , Rfl:    atorvastatin (LIPITOR) 40 MG tablet, 2 tabs by mouth daily., Disp: 180 tablet, Rfl: 3   blood glucose meter kit and supplies, Dispense based on patient and insurance preference. Use up to four times daily as directed. (FOR ICD-10 E10.9, E11.9)., Disp: 1 each, Rfl: 0   Blood Glucose Monitoring Suppl (AGAMATRIX PRESTO) w/Device KIT, Check blood glucose twice daily before meals, Disp: 1 kit, Rfl: 0   calcium carbonate (TUMS - DOSED IN MG ELEMENTAL CALCIUM) 500 MG chewable tablet, Chew 1 tablet by mouth as needed for indigestion or heartburn. (Patient not taking: Reported on 08/29/2022), Disp: , Rfl:    carvedilol (COREG) 3.125 MG tablet, Take  1 tablet (3.125 mg total) by mouth 2 (two) times daily with a meal., Disp: 60 tablet, Rfl: 11   empagliflozin (JARDIANCE) 10 MG TABS tablet, Take 1 tablet (10 mg total) by mouth daily before breakfast., Disp: 30 tablet, Rfl: 11   gabapentin (NEURONTIN) 100 MG capsule, 3 caps by mouth 3 times daily, Disp: 270 capsule, Rfl: 11   glipiZIDE (GLUCOTROL) 10 MG tablet, 1 tab by mouth twice daily  with meals, Disp: 60 tablet, Rfl: 11   glucose blood (AGAMATRIX PRESTO TEST) test strip, Check blood glucose twice daily before meals., Disp: 100 each, Rfl: 11   ibuprofen (ADVIL) 600 MG tablet, Take 1 tablet (600 mg total) by mouth every 8 (eight) hours as needed., Disp: 60 tablet, Rfl: 1   lisinopril (ZESTRIL) 20 MG tablet, Take 1 tablet (20 mg total) by mouth daily., Disp: 30 tablet, Rfl: 11   loratadine (CLARITIN) 10 MG tablet, Take 1 tablet (10 mg total) by mouth daily. (Patient not taking: Reported on 08/29/2022), Disp: 30 tablet, Rfl: 11   metFORMIN (GLUCOPHAGE-XR) 500 MG 24 hr tablet, 2 tabs by mouth twice daily with meals, Disp: 120 tablet, Rfl: 11   nitroGLYCERIN (NITROSTAT) 0.4 MG SL tablet, Place 1 tablet (0.4 mg total) under the tongue every 5 (five) minutes as needed for chest pain., Disp: 25 tablet, Rfl: 1   Omega-3 Fatty Acids (FISH OIL) 1000 MG CAPS, 2 caps by mouth twice daily (Patient not taking: Reported on 11/23/2022), Disp: , Rfl: 0   tirzepatide (MOUNJARO) 2.5 MG/0.5ML Pen, Inject 2.5 mg into the skin once a week., Disp: 2 mL, Rfl: 3   Significant Hospital Events: Including procedures, antibiotic start and stop dates in addition to other pertinent events   11/26/2022   11/26/2022 0 seen in ER bed [facility]  Objective   Blood pressure (!) 96/55, pulse 72, temperature 98 F (36.7 C), temperature source Oral, resp. rate 16, height 5\' 11"  (1.803 m), weight (!) 153.8 kg, SpO2 99%.       No intake or output data in the 24 hours ending 11/26/22 1640 Filed Weights   11/26/22 1133  Weight: (!) 153.8 kg    Examination: General: Obese. Looks well HENT: [place] clas 3-4 Lungs: CTA bilarally. Distant Cardiovascular: RRR. Normal heart soudn Abdomen: obese Extremities: intact Neuro: alert and orientex 3. Moves all 4 GU: not examined  Resolved Hospital Problem list   x  Assessment & Plan:  Mediastinal adenopathy - bulkly RML nodule 1.9cm  Also has  AKI  Plan  - check Quant Gold - check ACE - check ANA, DS, DNA, ESR - check HIV - check ABG  - when clinically stble -wil need PET scan -> NAV/EBUS or just the 2 without NAV/EBUS  Best Practice (right click and "Reselect all SmartList Selections" daily)   Per triad     SIGNATURE    Dr. Kalman Shan, M.D., F.C.C.P,  Pulmonary and Critical Care Medicine Staff Physician, [facility] Center Director - Interstitial Lung Disease  Program  Pulmonary Fibrosis [facility] at [facility] [place], [place], [address]   Pager: [phone], If no answer  -> Check AMION or Try [phone] Telephone (clinical office): [phone] Telephone (research): [phone]  5:32 PM 11/26/2022   LABS    PULMONARY No results for input(s): "PHART", "PCO2ART", "PO2ART", "HCO3", "TCO2", "O2SAT" in the last 168 hours.  Invalid input(s): "PCO2", "PO2"  CBC Recent Labs  Lab 11/23/22  1433 11/26/22 1136  HGB 14.6 14.5  HCT 43.5 43.8  WBC 6.5 5.7  PLT 201 193    COAGULATION No results for input(s): "INR" in the last 168 hours.  CARDIAC  No results for input(s): "TROPONINI" in the last 168 hours. No results for input(s): "PROBNP" in the last 168 hours.   CHEMISTRY Recent Labs  Lab 11/23/22 1433 11/24/22 1625 11/26/22 1136  NA 135 132* 134*  K 5.7* 5.3* 5.7*  CL 99 96 100  CO2 24 20 22   GLUCOSE 108* 108* 127*  BUN 33* 42* 38*  CREATININE 1.65* 1.66* 1.56*  CALCIUM 10.9* 11.3* 10.6*   Estimated Creatinine Clearance: 85.5 mL/min (A) (by C-G formula based on SCr of 1.56 mg/dL (H)).   LIVER Recent Labs  Lab 11/23/22 1433 11/24/22 1625  AST 20 22  ALT 44 36  ALKPHOS 132* 135*  BILITOT 0.7 0.4  PROT 7.7 7.7  ALBUMIN 4.1 4.0*     INFECTIOUS No results for input(s): "LATICACIDVEN", "PROCALCITON" in the last 168 hours.   ENDOCRINE CBG (last 3)  No results for input(s): "GLUCAP" in the last 72 hours.       IMAGING x48h  -  image(s) personally visualized  -   highlighted in bold CT Angio Chest PE W and/or Wo Contrast  Result Date: 11/26/2022 CLINICAL DATA:  Chest pain, abnormal chest radiograph, dyspnea * Tracking Code: BO * EXAM: CT ANGIOGRAPHY CHEST WITH CONTRAST TECHNIQUE: Multidetector CT imaging of the chest was performed using the standard protocol during bolus administration of intravenous contrast. Multiplanar CT image reconstructions and MIPs were obtained to evaluate the vascular anatomy. RADIATION DOSE REDUCTION: This exam was performed according to the departmental dose-optimization program which includes automated exposure control, adjustment of the mA and/or kV according to patient size and/or use of iterative reconstruction technique. CONTRAST:  75mL OMNIPAQUE IOHEXOL 350 MG/ML SOLN COMPARISON:  None Available. FINDINGS: Cardiovascular: Satisfactory opacification of the pulmonary arteries to the segmental level. No evidence of pulmonary embolism. Normal heart size. Enlargement of the main pulmonary artery measuring up to 3.8 cm in caliber. No pericardial effusion. Mediastinum/Nodes: Numerous bulky mediastinal and bilateral hilar lymph nodes, largest right hilar node measuring up to 2.8 x 2.7 cm (series 2, image 66). Thyroid gland, trachea, and esophagus demonstrate no significant findings. Lungs/Pleura: Somewhat spiculated appearing nodular opacity in the posterior right middle lobe abutting the major fissure measuring 1.9 x 1.3 cm (series 6, image 75). Fine ground-glass and nodular airspace opacities scattered throughout the lungs. Diffuse bilateral bronchial wall thickening. No pleural effusion or pneumothorax. Upper Abdomen: No acute abnormality. Musculoskeletal: No chest wall abnormality. No acute osseous findings. Review of the MIP images confirms the above findings. IMPRESSION: 1. Negative examination for pulmonary embolism. 2. Numerous bulky mediastinal and bilateral hilar lymph nodes, in keeping with findings  of prior chest radiograph. These are highly concerning for lymphoma or nodal metastatic disease, however benign differential considerations include sarcoidosis. 3. Somewhat spiculated appearing nodular opacity in the posterior right middle lobe abutting the major fissure measuring 1.9 x 1.3 cm. This is nonspecific and may be infectious or inflammatory, however primary lung malignancy is not excluded. 4. Fine ground-glass and nodular airspace opacities scattered throughout the lungs. Diffuse bilateral bronchial wall thickening. Findings are nonspecific and infectious or inflammatory. Notably, these are not specific in character for pulmonary sarcoidosis per discussion above. 5. Enlargement of the main pulmonary artery, as can be seen in pulmonary hypertension. Electronically Signed   By: Bonna Gains.D.  On: 11/26/2022 14:51   DG Chest 2 View  Result Date: 11/26/2022 CLINICAL DATA:  Chest pain. EXAM: CHEST - 2 VIEW COMPARISON:  12/14/2018 FINDINGS: Lateral view is markedly motion degraded. Frontal projection shows upper normal heart size. Interval development of soft tissue fullness in the right hilar region. Lungs otherwise clear. No acute bony abnormality. IMPRESSION: Interval development of soft tissue fullness in the right hilar region. CT chest with contrast recommended to further evaluate. Electronically Signed   By: Kennith Center M.D.   On: 11/26/2022 12:12

## 2022-11-26 NOTE — Telephone Encounter (Signed)
[facility] : [facility] Triage to notify patient being sent for evaluation with mainly posterior chest pressure, elevated Troponin T (no peak and decline at this point) and not clear if may have had MI earlier in month and now with failure and/or unstable angina with continued intermittent posterior chest pressure despite being on med. ECG was normal on the 17th, lungs were clear and no noted JVD, but did have some LE edema he stated started with onset of chest pressure episodes one month ago.

## 2022-11-26 NOTE — Progress Notes (Signed)
Spoke with MD on call. Patient is okay to stay in a regular room.

## 2022-11-26 NOTE — ED Notes (Signed)
Meds due delayed and iv fluid delayed  the pt is ourt of the sept getting an echo at present

## 2022-11-26 NOTE — ED Notes (Signed)
Doctor at the bedside 

## 2022-11-27 LAB — COMPREHENSIVE METABOLIC PANEL
ALT: 33 U/L (ref 0–44)
AST: 21 U/L (ref 15–41)
Albumin: 3.1 g/dL — ABNORMAL LOW (ref 3.5–5.0)
Alkaline Phosphatase: 104 U/L (ref 38–126)
Anion gap: 8 (ref 5–15)
BUN: 33 mg/dL — ABNORMAL HIGH (ref 6–20)
CO2: 23 mmol/L (ref 22–32)
Calcium: 10 mg/dL (ref 8.9–10.3)
Chloride: 101 mmol/L (ref 98–111)
Creatinine, Ser: 1.58 mg/dL — ABNORMAL HIGH (ref 0.61–1.24)
GFR, Estimated: 53 mL/min — ABNORMAL LOW (ref >60)
Glucose, Bld: 117 mg/dL — ABNORMAL HIGH (ref 70–99)
Potassium: 5 mmol/L (ref 3.5–5.1)
Sodium: 132 mmol/L — ABNORMAL LOW (ref 135–145)
Total Bilirubin: 0.7 mg/dL (ref 0.3–1.2)
Total Protein: 7 g/dL (ref 6.5–8.1)

## 2022-11-27 LAB — ECHOCARDIOGRAM COMPLETE
AR max vel: 2.83 cm2
AV Area VTI: 2.64 cm2
AV Area mean vel: 2.56 cm2
AV Mean grad: 2 mmHg
AV Peak grad: 4.3 mmHg
Ao pk vel: 1.04 m/s
Area-P 1/2: 3.91 cm2
Height: 71 in
S' Lateral: 3.2 cm

## 2022-11-27 LAB — CBC
HCT: 40.3 % (ref 39.0–52.0)
Hemoglobin: 13.4 g/dL (ref 13.0–17.0)
MCH: 29.5 pg (ref 26.0–34.0)
MCHC: 33.3 g/dL (ref 30.0–36.0)
MCV: 88.6 fL (ref 80.0–100.0)
Platelets: 175 10*3/uL (ref 150–400)
RBC: 4.55 MIL/uL (ref 4.22–5.81)
RDW: 13.4 % (ref 11.5–15.5)
WBC: 5.7 10*3/uL (ref 4.0–10.5)
nRBC: 0 % (ref 0.0–0.2)

## 2022-11-27 LAB — PHOSPHORUS: Phosphorus: 4.4 mg/dL (ref 2.5–4.6)

## 2022-11-27 LAB — GLUCOSE, CAPILLARY
Glucose-Capillary: 111 mg/dL — ABNORMAL HIGH (ref 70–99)
Glucose-Capillary: 164 mg/dL — ABNORMAL HIGH (ref 70–99)
Glucose-Capillary: 106 mg/dL — ABNORMAL HIGH (ref 70–99)
Glucose-Capillary: 140 mg/dL — ABNORMAL HIGH (ref 70–99)

## 2022-11-27 LAB — VITAMIN D 25 HYDROXY (VIT D DEFICIENCY, FRACTURES): Vit D, 25-Hydroxy: 14.75 ng/mL — ABNORMAL LOW (ref 30–100)

## 2022-11-27 LAB — BLOOD GAS, ARTERIAL
Acid-base deficit: 1.2 mmol/L (ref 0.0–2.0)
Bicarbonate: 23.6 mmol/L (ref 20.0–28.0)
O2 Saturation: 94.9 %
Patient temperature: 37
pCO2 arterial: 39 mmHg (ref 32–48)
pH, Arterial: 7.39 (ref 7.35–7.45)
pO2, Arterial: 66 mmHg — ABNORMAL LOW (ref 83–108)

## 2022-11-27 LAB — LACTIC ACID, PLASMA: Lactic Acid, Venous: 1.1 mmol/L (ref 0.5–1.9)

## 2022-11-27 LAB — SEDIMENTATION RATE: Sed Rate: 13 mm/hr (ref 0–16)

## 2022-11-27 LAB — TSH: TSH: 2.108 u[IU]/mL (ref 0.350–4.500)

## 2022-11-27 LAB — LACTATE DEHYDROGENASE: LDH: 105 U/L (ref 98–192)

## 2022-11-27 LAB — GAMMA GT: GGT: 178 U/L — ABNORMAL HIGH (ref 7–50)

## 2022-11-27 MED ORDER — ENOXAPARIN SODIUM 80 MG/0.8ML IJ SOSY
80.0000 mg | PREFILLED_SYRINGE | INTRAMUSCULAR | Status: AC
  Administered 2022-11-27 – 2022-11-30 (×4): 80 mg via SUBCUTANEOUS
  Filled 2022-11-27 (×4): qty 0.8

## 2022-11-27 NOTE — Progress Notes (Signed)
Summary: Roy Andrews is a 52 yo Spanish speaking person living with significant medical history of uncontrolled DM, HTN, HLD, Obesity, OSA not currently using cPAPprior COVID infection requiring ICU admission and 4 months of supplemental O2 requirement that was sent to the ED after evaluation at his PCP revealed an abnormal HS troponin after evaluation for dyspnea on exertion and bilateral midback pain.   Subjective:  Son and patient's brother at bedside. Overnight patient unable to tolerate trial with CPAP. Placed on 1L O2 via [place], back to room air. Patient reports feeling better this morning, no CP. Denies SOB, N/V, abdominal pain, or any acute pain. Last BM last night, patient woke up and had a small BM. Patient was updated on labs/imaging that was ordered and tentative plan for today.   Patient expressed that he has only recently started taking all previously prescribed medications for his diabetes and BP. Tried to wear a CPAP mask in the past but could not tolerate it. Confirmed that he does not have medical insurance and is self-pay.   Objective:  Vital signs in last 24 hours: Vitals:   11/26/22 2355 11/27/22 0500 11/27/22 0513 11/27/22 0907  BP: 106/65  106/71 110/76  Pulse: 85  88 89  Resp: 17  18 18   Temp: 98.8 F (37.1 C)  98.3 F (36.8 C) 98 F (36.7 C)  TempSrc:    Oral  SpO2: 92%  93% 97%  Weight:  (!) 156.4 kg    Height:          Latest Ref Rng & Units 11/27/2022    7:45 AM 11/26/2022   11:36 AM 11/23/2022    2:33 PM  CBC  WBC 4.0 - 10.5 K/uL 5.7  5.7  6.5   Hemoglobin 13.0 - 17.0 g/dL 29.5  62.1  30.8   Hematocrit 39.0 - 52.0 % 40.3  43.8  43.5   Platelets 150 - 400 K/uL 175  193  201        Latest Ref Rng & Units 11/27/2022    7:45 AM 11/26/2022    7:29 PM 11/26/2022   11:36 AM  CMP  Glucose 70 - 99 mg/dL 657   846   BUN 6 - 20 mg/dL 33   38   Creatinine 9.62 - 1.24 mg/dL 9.52   8.41   Sodium 324 - 145 mmol/L 132   134   Potassium 3.5 - 5.1  mmol/L 5.0   5.7   Chloride 98 - 111 mmol/L 101   100   CO2 22 - 32 mmol/L 23   22   Calcium 8.9 - 10.3 mg/dL 40.1   02.7   Total Protein 6.5 - 8.1 g/dL 7.0  7.6    Total Bilirubin 0.3 - 1.2 mg/dL 0.7  0.7    Alkaline Phos 38 - 126 U/L 104  108    AST 15 - 41 U/L 21  21    ALT 0 - 44 U/L 33  37     Phosphorus 4.4 LDH 105 Gamma GT 178 Lactic acid plasma 1.1 Vitamin D 25 14.75 Sedimentation rate 13 TSH 2.108 Blood gas, arterial: pH 7.39, bicarbonate 23.6, pCO2 39, pO2 66, right radial  CRP 0.6 HIV non-reactive Troponin 8, 8  Hep functional panel WNL BNP 37.3 UA positive for glucose  CBG 111, 164  PTH standing  Labs in process:  Rheumatoid factor Anti-DNA ANA ACE Quantiferon TB Gold Calcitriol  ------------- Echocardiogram:  1. Left ventricular ejection fraction,  by estimation, is 60 to 65% . The left ventricle has normal function. The left ventricle has no regional wall motion abnormalities.  2. Right ventricular systolic function is normal. The right ventricular size is normal.  3. The mitral valve is normal in structure. Trivial mitral valve regurgitation. No evidence of mitral stenosis.  4. The aortic valve is tricuspid. Aortic valve regurgitation is not visualized. No aortic stenosis is present.  5. The inferior vena cava is normal in size with greater than 50% respiratory variability, suggesting right atrial pressure of 3 mmHg.  Physical Exam Constitutional: Sitting up in bed in no acute distress, on room air. Conversing appropriately.  CV: RRR, no rubs, murmurs, or gallops, LE edema 1+ up to knee Pulmonary/Respiratory: Clear bilaterally, normal respiratory effort; no wheezing, ronchi, or rales Abdominal: Soft, non-tender, non-distended; body habitus limiting assessment of organomegaly Skin: Warm and dry.  Psych: Normal mood and affect.   Assessment/Plan:  Principal Problem:   AKI (acute kidney injury) (HCC) Active Problems:   Primary hypertension   Type 2  diabetes mellitus with hyperglycemia, without long-term current use of insulin (HCC)   Class 3 severe obesity due to excess calories with serious comorbidity and body mass index (BMI) of 45.0 to 49.9 in adult [name])   Dyslipidemia associated with type 2 diabetes mellitus (HCC)   Diabetic peripheral neuropathy associated with type 2 diabetes mellitus (HCC)   Hyperkalemia   Mediastinal lymphadenopathy   OSA (obstructive sleep apnea)   Hypercalcemia   Elevated alkaline phosphatase level  Patient is a 52 yo Spanish speaking person living with significant medical history of uncontrolled DM, HTN, HLD, Obesity, OSA who presented with abnormal labs, worsening dyspnea, chest pain, and 18 lb unintentional weight loss. He was admitted for AKI, which allowed for further workup of abnormal labs and symptoms. ACS ruled out in the ED.   AKI Hyperkalemia Patient with longstanding hypertension with intermittent adherence to outpatient medications for blood pressure and diabetes. Patient recently started on Jardiance and recently taking all prescribed medications, some renally cleared, at around the same time (~3 weeks ago). Patient received intravenous contrast for imaging as well.   Will hold nephrotoxic and renally cleared medications at this time. Given one 500 NS bolus yesterday. Tele unremarkable. Cr. 1.56 (down from 1.66 yesterday). Improved s/p NS bolus and increased PO intake. Potassium 5.0 today. UA with microscopy with glucosuria. Renal ultrasound 7/20 showing no mass or hydronephrosis, echogenicity within normal limits, no mass or hydronephrosis visualized. Only recently started treating diabetes more consistently, A1C 4/22 13.4, A1C 7/17 8.9.    Plan:  - Telemetry  - Monitor labs and replete as necessary  - Hold nephrotoxic and renally cleared medications    Mediastinal LAD Bilateral hilar LAD Hypercalcemia  Dyspnea on exertion OSA not on cPAP Patient with progressive worsening in dyspnea over  the past month and +ROS for B symptoms. Now with CT chest findings of mediastinal and bilateral LN enlargement concerning for sarcoidosis, lymphoma, metastatic disease, which could be contributing to worsening dyspnea. Other differentials include TB (patient has been in the [place] for the past 21 years, emigrated from [place]). With recent history of worsening renal function, will assess for glomerulonephritis as well. No history of prior colonoscopy.   Review of his labs one year ago without sign of hypercalcemia present on repeat studies for the past 3 days, calcium 10 today.   PCCM consulted in the ED, appreciate their guidance for further work-up (including infectious, inflammatory, and malignancy) while patient  is admitted. Recommended PET when clinically stable, but need to consider that patient is self-pay.   Plan:  - Follow up on remaining labs - Monitor respiratory status and telemetry   Dyspnea on exertion Atypical chest pain OSA not on cPA Pulmonary HTN Differential diagnosis above potentially contributing to DOE. However, patient with acute worsening in DOE and LE edema also concerning for cardiac etiologies such as acute heart failure. OSA is untreated with CTA with findings suggestive of pulmonary HTN.   Patient is HDS, back on room air. Patient denying CP or SOB. BNP normal. Echo on admission showing LVEF 60 to 65%, no aortic stenosis, trivial MR regurgitation, and right atrial pressure of 3 mmHg. No prior echocardiogram available for comparison.   Plan:  - Monitor fluid status - Will hold off on trialing diuresis in setting of AKI - Daily weights and I/O   RML nodule Spiculated appearing nodular opacity in the posterior right middle lobe abutting the major fissure measuring 1.9 x 1.3 cm seen on CT chest PE study. Ddx concerning for infectious vs inflammation vs malignancy. Patient does not carry a former tobacco use.  Plan:  - PCCM consulted by ED provider, appreciate  recommendations - May need further imaging with PET or EBUS, perhaps during this admission given insurance status limiting outpatient follow-up.    Elevated alkaline phosphatase - normalized No changes in liver enzymes. Alk phos 108 from 135, WNL. AST 21, ALT 37, WNL. Vitamin D levels 14.75. Vitamin D 1-25 pending. GGT 178. Isolated, elevated GGT could be caused by alcohol intake, poor/fatty diet, and contaminants. Most likely related to patient's eating habits, BMI 48.09 but will continue to monitor.   Plan: - Vitamin D repletion when Vitamin D 1-25 is back  - Will consider RUQ US/imaging if labs trend towards concern    HTN HLD Stable on admission. Will hold home Lisinopril in setting of AKI. -Monitor BP -Continue Lipitor 40 mg daily   Type 2 DM  Non insulin dependent. A1c 8.9 3 days ago. - Hold home medications - SSI severe   Diet: Carb-Modified with fluid restriction VTE: Enoxaparin Code: Full  Prior to Admission Living Arrangement: Home Anticipated Discharge Location: Home Barriers to Discharge: Medical management/work-up Dispo: Anticipated discharge in approximately 1-2 day(s).   Philomena Doheny, MD, PGY-1 11/27/2022, 2:05 PM Pager: [phone] After 5pm on weekdays and 1pm on weekends: On Call pager [phone]

## 2022-11-27 NOTE — Plan of Care (Signed)

## 2022-11-27 NOTE — Progress Notes (Signed)
NEW ADMISSION NOTE New Admission Note:   Arrival Method: Patient arrived from ED. Mental Orientation:  Alert and oriented x 4. Telemetry: 6M-06 NSR Assessment: Completed Skin: warm, dry and intact with discoloration noted on the left foot and lower leg bilaterally. IV: R AC, SL Pain: denies any pain. Tubes: N/A Safety Measures: Safety Fall Prevention Plan has been given, discussed and implemented. Admission: Completed [facility] Orientation: Patient has been orientated to the room, unit and staff.  Family:  Orders have been reviewed and implemented. Will continue to monitor the patient. Call light has been placed within reach and bed alarm has been activated.   Arvilla Meres, RN

## 2022-11-28 ENCOUNTER — Ambulatory Visit: Payer: Self-pay | Admitting: Internal Medicine

## 2022-11-28 DIAGNOSIS — E875 Hyperkalemia: Secondary | ICD-10-CM

## 2022-11-28 DIAGNOSIS — N179 Acute kidney failure, unspecified: Secondary | ICD-10-CM

## 2022-11-28 LAB — CYCLIC CITRUL PEPTIDE ANTIBODY, IGG/IGA: CCP Antibodies IgG/IgA: 7 units (ref 0–19)

## 2022-11-28 LAB — CBC
HCT: 40.1 % (ref 39.0–52.0)
Hemoglobin: 13.5 g/dL (ref 13.0–17.0)
MCH: 30.3 pg (ref 26.0–34.0)
MCHC: 33.7 g/dL (ref 30.0–36.0)
MCV: 90.1 fL (ref 80.0–100.0)
Platelets: 181 10*3/uL (ref 150–400)
RBC: 4.45 MIL/uL (ref 4.22–5.81)
RDW: 13.3 % (ref 11.5–15.5)
WBC: 6.6 10*3/uL (ref 4.0–10.5)
nRBC: 0 % (ref 0.0–0.2)

## 2022-11-28 LAB — RENAL FUNCTION PANEL
Albumin: 3.3 g/dL — ABNORMAL LOW (ref 3.5–5.0)
Anion gap: 9 (ref 5–15)
BUN: 34 mg/dL — ABNORMAL HIGH (ref 6–20)
CO2: 25 mmol/L (ref 22–32)
Calcium: 9.8 mg/dL (ref 8.9–10.3)
Chloride: 99 mmol/L (ref 98–111)
Creatinine, Ser: 1.65 mg/dL — ABNORMAL HIGH (ref 0.61–1.24)
GFR, Estimated: 50 mL/min — ABNORMAL LOW (ref >60)
Glucose, Bld: 96 mg/dL (ref 70–99)
Phosphorus: 4.5 mg/dL (ref 2.5–4.6)
Potassium: 4.4 mmol/L (ref 3.5–5.1)
Sodium: 133 mmol/L — ABNORMAL LOW (ref 135–145)

## 2022-11-28 LAB — GLUCOSE, CAPILLARY
Glucose-Capillary: 114 mg/dL — ABNORMAL HIGH (ref 70–99)
Glucose-Capillary: 117 mg/dL — ABNORMAL HIGH (ref 70–99)
Glucose-Capillary: 115 mg/dL — ABNORMAL HIGH (ref 70–99)
Glucose-Capillary: 120 mg/dL — ABNORMAL HIGH (ref 70–99)

## 2022-11-28 LAB — CALCITRIOL (1,25 DI-OH VIT D): Vit D, 1,25-Dihydroxy: 81.6 pg/mL — ABNORMAL HIGH (ref 24.8–81.5)

## 2022-11-28 LAB — ANA: Anti Nuclear Antibody (ANA): POSITIVE — AB

## 2022-11-28 LAB — CALCIUM / CREATININE RATIO, URINE
Calcium, Ur: 9 mg/dL
Calcium/Creat.Ratio: 112 mg/g creat (ref 14–318)
Creatinine, Urine: 80.7 mg/dL

## 2022-11-28 LAB — ANTI-DNA ANTIBODY, DOUBLE-STRANDED: ds DNA Ab: <1 IU/mL (ref 0–9)

## 2022-11-28 LAB — ANGIOTENSIN CONVERTING ENZYME: Angiotensin-Converting Enzyme: 14 U/L (ref 14–82)

## 2022-11-28 LAB — RHEUMATOID FACTOR: Rheumatoid fact SerPl-aCnc: <10 IU/mL (ref <14.0)

## 2022-11-28 MED ORDER — IOHEXOL 9 MG/ML PO SOLN
500.0000 mL | ORAL | Status: AC
  Administered 2022-11-28 (×2): 500 mL via ORAL

## 2022-11-28 NOTE — Progress Notes (Signed)
Summary: Roy Andrews is a 52 yo Spanish speaking person living with significant medical history of uncontrolled DM, HTN, HLD, Obesity, OSA not currently using cPAPprior COVID infection requiring ICU admission and 4 months of supplemental O2 requirement that was sent to the ED after evaluation at his PCP revealed an abnormal HS troponin after evaluation for dyspnea on exertion and bilateral midback pain.   Subjective:  Patient in negative pressure room under airborne precautions. No overnight events reported. Patient reports feeling sad and anxious while waiting for results. Denies CP, SOB, abdominal pain, or N/V. Has been trying to eat and drink but will try to drink more water today. Has been experiencing familiar urinary symptoms with an interrupted stream, but not too bad. Has had bowel movements, reports some burning pain with defecation, but also stated he eats spicy foods in his diet and that might be an explanation. Denies seeing blood in stool.   Objective:  Vital signs in last 24 hours: Vitals:   11/27/22 2025 11/28/22 0439 11/28/22 0909 11/28/22 1619  BP: 107/66 104/62 (!) 98/58 119/83  Pulse: 89 85 83 88  Resp: 20 18 18    Temp: 98.3 F (36.8 C) 98.1 F (36.7 C) 98.6 F (37 C) 98.3 F (36.8 C)  TempSrc: Oral Oral  Oral  SpO2: 94% 96% 93% 95%  Weight:      Height:          Latest Ref Rng & Units 11/28/2022    2:38 AM 11/27/2022    7:45 AM 11/26/2022   11:36 AM  CBC  WBC 4.0 - 10.5 K/uL 6.6  5.7  5.7   Hemoglobin 13.0 - 17.0 g/dL 95.2  84.1  32.4   Hematocrit 39.0 - 52.0 % 40.1  40.3  43.8   Platelets 150 - 400 K/uL 181  175  193        Latest Ref Rng & Units 11/28/2022    2:38 AM 11/27/2022    7:45 AM 11/26/2022    7:29 PM  CMP  Glucose 70 - 99 mg/dL 96  401    BUN 6 - 20 mg/dL 34  33    Creatinine 0.27 - 1.24 mg/dL 2.53  6.64    Sodium 403 - 145 mmol/L 133  132    Potassium 3.5 - 5.1 mmol/L 4.4  5.0    Chloride 98 - 111 mmol/L 99  101    CO2 22 -  32 mmol/L 25  23    Calcium 8.9 - 10.3 mg/dL 9.8  47.4    Total Protein 6.5 - 8.1 g/dL  7.0  7.6   Total Bilirubin 0.3 - 1.2 mg/dL  0.7  0.7   Alkaline Phos 38 - 126 U/L  104  108   AST 15 - 41 U/L  21  21   ALT 0 - 44 U/L  33  37    Rheumatoid factor < 10 Anti-DNA < 1 ANA Positive  ACE 14 Calcitriol 81.6 (elevated) Calcium/Cr ratio 112 Urine calcium 9  In process:  Quantiferon TB Gold  PTH  ------------- Physical Exam Constitutional: Sitting up in bed, in no acute distress, on room air.  CV: RRR, no rubs, murmurs, or gallops, LE edema improved from yesterday  Pulmonary/Respiratory: Clear bilaterally, normal respiratory effort.  Abdominal: Soft, non-tender, non-distended; positive bowel sounds.  Skin: Warm and dry. 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 Feeling slightly anxious about further workup and pending results.  Assessment/Plan:  Principal Problem:   AKI (acute kidney injury) (HCC) Active Problems:   Primary hypertension   Type 2 diabetes mellitus with hyperglycemia, without long-term current use of insulin (HCC)   Class 3 severe obesity due to excess calories with serious comorbidity and body mass index (BMI) of 45.0 to 49.9 in adult [facility])   Dyslipidemia associated with type 2 diabetes mellitus (HCC)   Diabetic peripheral neuropathy associated with type 2 diabetes mellitus (HCC)   Hyperkalemia   Mediastinal lymphadenopathy   OSA (obstructive sleep apnea)   Hypercalcemia   Elevated alkaline phosphatase level  Patient is a 52 yo Spanish speaking person living with significant medical history of uncontrolled DM, HTN, HLD, Obesity, OSA who presented with abnormal labs, worsening dyspnea, chest pain, and 18 lb unintentional weight loss. He was admitted for AKI, which allowed for further workup of abnormal labs and symptoms. ACS ruled out in the ED.   AKI Hyperkalemia Patient with longstanding hypertension with  intermittent adherence to outpatient medications for blood pressure and diabetes. Patient recently started on Jardiance and recently taking all prescribed medications, some renally cleared, at around the same time (~3 weeks ago). Patient received intravenous contrast for imaging as well. Renal ultrasound 7/20 showing no mass or hydronephrosis, echogenicity within normal limits, no mass or hydronephrosis visualized. Only recently started treating diabetes more consistently, A1C 4/22 13.4, A1C 7/17 8.9.    Will hold nephrotoxic and renally cleared medications at this time. Given one 500 NS bolus 7/20. Tele unremarkable. Cr. 1.65 (up from 1.56 yesterday). Will encourage PO intake of water/fluids. Potassium improved today (4.4). Will get IV/oral contrast for CT imaging 7/22, f/u tomorrow's labs and consider IV fluid bolus if Cr worsens.  Plan:  - Telemetry  - Monitor labs and replete as necessary  - Hold nephrotoxic and renally cleared medications    Mediastinal LAD Bilateral hilar LAD Hypercalcemia  Dyspnea on exertion OSA not on cPAP Patient with progressive worsening in dyspnea over the past month and +ROS for B symptoms. Now with CT chest findings of mediastinal and bilateral LN enlargement concerning for sarcoidosis, lymphoma, metastatic disease, which could be contributing to worsening dyspnea. Other differentials include TB (patient has been in the [place] for the past 21 years, emigrated from [place]). With recent history of worsening renal function, will assess for glomerulonephritis as well. No history of prior colonoscopy.   Review of his labs one year ago without sign of hypercalcemia present on repeat studies for the past 3 days, calcium 9.8 today. Corrected calcium (albumin 3.3) = 10.4  PCCM consulted in the ED, appreciate their guidance for further work-up (including infectious, inflammatory, and malignancy) while patient is admitted. Recommended PET when clinically stable, but need to  consider that patient is self-pay. PCCM aware of patient but has not been by yet.   Plan:  - Follow up on remaining labs - Monitor respiratory status and telemetry   Dyspnea on exertion Atypical chest pain OSA not on cPA Pulmonary HTN Differential diagnosis above potentially contributing to DOE. However, patient with acute worsening in DOE and LE edema also concerning for cardiac etiologies such as acute heart failure. OSA is untreated with CTA with findings suggestive of pulmonary HTN.   Patient is HDS, back on room air. Patient denying CP or SOB. BNP normal. Echo on admission showing LVEF 60 to 65%, no aortic stenosis, trivial MR regurgitation, and right atrial pressure of 3 mmHg. No prior echocardiogram available for comparison.   Plan:  - Monitor  fluid status - Will hold off on trialing diuresis in setting of AKI - Daily weights and I/O   RML nodule Spiculated appearing nodular opacity in the posterior right middle lobe abutting the major fissure measuring 1.9 x 1.3 cm seen on CT chest PE study. Ddx concerning for infectious vs inflammation vs malignancy. Patient does not carry a former tobacco use. Ordered CT abdomen/pelvis w/wo with oral and IV contrast on 7/22 (discussed with attending Dr. Antony Contras and radiology) to continue workup of potential metastases given spiculated mass.   Plan:  - F/u CT abdomen/pelvis - PCCM consulted by ED provider, appreciate recommendations - May need further imaging with PET or EBUS, perhaps during this admission given insurance status limiting outpatient follow-up.    Elevated alkaline phosphatase - normalized No changes in liver enzymes. Alk phos 108 from 135, WNL. AST 21, ALT 37, WNL. Vitamin D levels 14.75 (low), vitamin D 1-25 81.6 (elevated). GGT 178. Isolated, elevated GGT could be caused by alcohol intake, poor/fatty diet, and contaminants. Most likely related to patient's eating habits, BMI 48.09 but will continue to monitor.   Plan: - F/u  PTH  - Will consider RUQ US/imaging if labs trend towards concern    HTN HLD Stable on admission. Will hold home Lisinopril in setting of AKI. -Monitor BP -Continue Lipitor 40 mg daily   Type 2 DM  Non insulin dependent. A1c 8.9 3 days ago. - Hold home medications - SSI severe   Diet: Carb-Modified with fluid restriction VTE: Enoxaparin Code: Full  Prior to Admission Living Arrangement: Home Anticipated Discharge Location: Home Barriers to Discharge: Medical management/work-up Dispo: Anticipated discharge in approximately 1-2 day(s).   Philomena Doheny, MD, PGY-1 11/28/2022, 4:51 PM Pager: [phone] After 5pm on weekdays and 1pm on weekends: On Call pager [phone]

## 2022-11-28 NOTE — Plan of Care (Signed)

## 2022-11-28 NOTE — TOC CM/SW Note (Addendum)
Transition of Care [facility]) - Inpatient Brief Assessment   Patient Details  Name: Roy Andrews MRN: [id number] Date of Birth: [birth year]  Transition of Care [facility]) CM/SW Contact:    Tom-Johnson, Hershal Coria, RN Phone Number: 11/28/2022, 3:17 PM   Clinical Narrative:  Patient presented to the ED from his PCP for abnormal blood work, admitted for AKI. Medical workup continues.   Patient from home with wife, has two children. Self employed, independent prior to admission. Patient is non-documented, does not have medical insurance.  MATCH will be done at discharge for Prescription if eligible.  PCP is Julieanne Manson, MD and uses [facility] on [address].   CM will continue to follow as patient progresses with care towards discharge.         Transition of Care Asessment: Insurance and Status: Insurance coverage has been reviewed Patient has primary care physician: Yes Home environment has been reviewed: Yes Prior level of function:: Independent Prior/Current Home Services: No current home services Social Determinants of Health Reivew: SDOH reviewed no interventions necessary Readmission risk has been reviewed: Yes Transition of care needs: transition of care needs identified, TOC will continue to follow (No Insurance, Medication Assistance)

## 2022-11-29 ENCOUNTER — Inpatient Hospital Stay (HOSPITAL_COMMUNITY): Payer: Self-pay

## 2022-11-29 ENCOUNTER — Ambulatory Visit: Payer: Self-pay | Admitting: Internal Medicine

## 2022-11-29 DIAGNOSIS — E871 Hypo-osmolality and hyponatremia: Secondary | ICD-10-CM

## 2022-11-29 LAB — CBC
HCT: 39.9 % (ref 39.0–52.0)
Hemoglobin: 13.7 g/dL (ref 13.0–17.0)
MCH: 29.7 pg (ref 26.0–34.0)
MCHC: 34.3 g/dL (ref 30.0–36.0)
MCV: 86.4 fL (ref 80.0–100.0)
Platelets: 193 10*3/uL (ref 150–400)
RBC: 4.62 MIL/uL (ref 4.22–5.81)
RDW: 13.2 % (ref 11.5–15.5)
WBC: 6.6 10*3/uL (ref 4.0–10.5)
nRBC: 0 % (ref 0.0–0.2)

## 2022-11-29 LAB — QUANTIFERON-TB GOLD PLUS (RQFGPL)
QuantiFERON Mitogen Value: >10 IU/mL
QuantiFERON Nil Value: 0.23 IU/mL
QuantiFERON TB1 Ag Value: 0.49 IU/mL
QuantiFERON TB2 Ag Value: 0.4 IU/mL

## 2022-11-29 LAB — ANGIOTENSIN CONVERTING ENZYME: Angiotensin-Converting Enzyme: 25 U/L (ref 14–82)

## 2022-11-29 LAB — QUANTIFERON-TB GOLD PLUS: QuantiFERON-TB Gold Plus: NEGATIVE

## 2022-11-29 LAB — RENAL FUNCTION PANEL
Albumin: 3.3 g/dL — ABNORMAL LOW (ref 3.5–5.0)
Anion gap: 8 (ref 5–15)
BUN: 30 mg/dL — ABNORMAL HIGH (ref 6–20)
CO2: 26 mmol/L (ref 22–32)
Calcium: 9.6 mg/dL (ref 8.9–10.3)
Chloride: 96 mmol/L — ABNORMAL LOW (ref 98–111)
Creatinine, Ser: 1.55 mg/dL — ABNORMAL HIGH (ref 0.61–1.24)
GFR, Estimated: 54 mL/min — ABNORMAL LOW (ref >60)
Glucose, Bld: 102 mg/dL — ABNORMAL HIGH (ref 70–99)
Phosphorus: 4.1 mg/dL (ref 2.5–4.6)
Potassium: 4.2 mmol/L (ref 3.5–5.1)
Sodium: 130 mmol/L — ABNORMAL LOW (ref 135–145)

## 2022-11-29 LAB — PARATHYROID HORMONE, INTACT (NO CA): PTH: 5 pg/mL — ABNORMAL LOW (ref 15–65)

## 2022-11-29 LAB — GLUCOSE, CAPILLARY
Glucose-Capillary: 110 mg/dL — ABNORMAL HIGH (ref 70–99)
Glucose-Capillary: 124 mg/dL — ABNORMAL HIGH (ref 70–99)
Glucose-Capillary: 117 mg/dL — ABNORMAL HIGH (ref 70–99)
Glucose-Capillary: 128 mg/dL — ABNORMAL HIGH (ref 70–99)

## 2022-11-29 MED ORDER — HYDROXYZINE HCL 25 MG PO TABS
25.0000 mg | ORAL_TABLET | Freq: Three times a day (TID) | ORAL | Status: DC | PRN
  Filled 2022-11-29: qty 1

## 2022-11-29 MED ORDER — ONDANSETRON HCL 4 MG PO TABS
4.0000 mg | ORAL_TABLET | Freq: Once | ORAL | Status: AC
  Administered 2022-11-30: 4 mg via ORAL
  Filled 2022-11-29 (×2): qty 1

## 2022-11-29 MED ORDER — IOHEXOL 350 MG/ML SOLN
75.0000 mL | Freq: Once | INTRAVENOUS | Status: AC | PRN
  Administered 2022-11-29: 75 mL via INTRAVENOUS

## 2022-11-29 NOTE — Plan of Care (Signed)

## 2022-11-29 NOTE — Progress Notes (Signed)
NAME:  Roy Andrews, MRN:  [id number], DOB:  [birth year], LOS: 2 ADMISSION DATE:  11/26/2022, CONSULTATION DATE:  11/26/22 REFERRING MD:  Dr. Criselda Peaches CHIEF COMPLAINT:  Abnormal CT Chest   History of Present Illness:  Roy Andrews is a 52 y.o.-male with a past medical history significant for diabetes, HTN, and HLD who presented to the ED from PCP due to abnormal labs, including elevated high-sensitivity troponin..  Patient denied any chest pain but did report mild back pain with Discomfort mostly located between her shoulder blades.  Also reports fatigue, intermittent dyspnea, and pleuritic pain.  On ED arrival patient was seen hemodynamically stable without signs of hypoxia. Labor significant for AKI and mild hyponatremia and hyperkalemia and HS trop 27. CTA chest was negative for PE but significant for mediastinal and hilar lymphadenopathy concerning for malignancy or  possible sarcoidosis. PCCM consulted for further workup.   He is a never smoker. No family history of autoimmune disease. He works in [employer], mostly pressure washing currently. No alcohol use. He does feel like he has trouble swallowing solids and liquids at times. He has been in the [place] for the past 20 years, with no travel outside of the country. No known TB exposures. No history of jail/prison time.   History of pneumonia in 2008 - hospitalized. History of covid pneumonia in 2020 - hospitalized.   Pertinent  Medical History  DMII HLD Obesity OSA  Significant Hospital Events: Including procedures, antibiotic start and stop dates in addition to other pertinent events     Interim History / Subjective:   No acute issues Interpreter used   Objective   Blood pressure 109/68, pulse 92, temperature 97.9 F (36.6 C), temperature source Oral, resp. rate 19, height 5\' 11"  (1.803 m), weight (!) 156.4 kg, SpO2 93%.        Intake/Output Summary (Last 24 hours) at 11/29/2022 7829 Last data filed  at 11/28/2022 2152 Gross per 24 hour  Intake 480 ml  Output 0 ml  Net 480 ml   Filed Weights   11/26/22 1133 11/26/22 1915 11/27/22 0500  Weight: (!) 153.8 kg (!) 156.2 kg (!) 156.4 kg    Examination: General: obese, middle aged male, no distress HENT: [place]/AT, moist mucous membranes, sclera anicteric Lungs: clear to auscultation, no wheezing Cardiovascular: rrr, no murmurs Abdomen: soft, non-tender, non-distended Extremities: warm, no edema Neuro: alert, oriented, moving all extremities GU: n/a  ACE 25 ESR 13 CRP 0.6 ANA positive RF, CCP, ds DNA - negative  Resolved Hospital Problem list     Assessment & Plan:  Mediastinal adenopathy - bulkly RML nodule 1.9cm Positive ANA  Plan: - Discussed in length bronchoscopy procedure in order to take biopsy samples of his lymph nodes for further evaluation of infection, inflammation or cancer - Pending scheduling of procedure in endo - Quantiferon gold is pending, low concern for TB at this time - Follow up ANA titer and pattern  PCCM to follow   Best Practice (right click and "Reselect all SmartList Selections" daily)   Per Primary  Labs   CBC: Recent Labs  Lab 11/23/22 1433 11/26/22 1136 11/27/22 0745 11/28/22 0238 11/29/22 0212  WBC 6.5 5.7 5.7 6.6 6.6  NEUTROABS 3.5  --   --   --   --   HGB 14.6 14.5 13.4 13.5 13.7  HCT 43.5 43.8 40.3 40.1 39.9  MCV 89 90.5 88.6 90.1 86.4  PLT 201 193 175 181 193    Basic Metabolic Panel:  Recent Labs  Lab 11/24/22 1625 11/26/22 1136 11/27/22 0745 11/28/22 0238 11/29/22 0212  NA 132* 134* 132* 133* 130*  K 5.3* 5.7* 5.0 4.4 4.2  CL 96 100 101 99 96*  CO2 20 22 23 25 26   GLUCOSE 108* 127* 117* 96 102*  BUN 42* 38* 33* 34* 30*  CREATININE 1.66* 1.56* 1.58* 1.65* 1.55*  CALCIUM 11.3* 10.6* 10.0 9.8 9.6  PHOS  --   --  4.4 4.5 4.1   GFR: Estimated Creatinine Clearance: 86.9 mL/min (A) (by C-G formula based on SCr of 1.55 mg/dL (H)). Recent Labs  Lab  11/26/22 1136 11/27/22 0745 11/28/22 0238 11/29/22 0212  WBC 5.7 5.7 6.6 6.6  LATICACIDVEN  --  1.1  --   --     Liver Function Tests: Recent Labs  Lab 11/23/22 1433 11/24/22 1625 11/26/22 1929 11/27/22 0745 11/28/22 0238 11/29/22 0212  AST 20 22 21 21   --   --   ALT 44 36 37 33  --   --   ALKPHOS 132* 135* 108 104  --   --   BILITOT 0.7 0.4 0.7 0.7  --   --   PROT 7.7 7.7 7.6 7.0  --   --   ALBUMIN 4.1 4.0* 3.5 3.1* 3.3* 3.3*   No results for input(s): "LIPASE", "AMYLASE" in the last 168 hours. No results for input(s): "AMMONIA" in the last 168 hours.  ABG    Component Value Date/Time   PHART 7.39 11/27/2022 0500   PCO2ART 39 11/27/2022 0500   PO2ART 66 (L) 11/27/2022 0500   HCO3 23.6 11/27/2022 0500   TCO2 31 10/14/2018 0554   ACIDBASEDEF 1.2 11/27/2022 0500   O2SAT 94.9 11/27/2022 0500     Coagulation Profile: No results for input(s): "INR", "PROTIME" in the last 168 hours.  Cardiac Enzymes: No results for input(s): "CKTOTAL", "CKMB", "CKMBINDEX", "TROPONINI" in the last 168 hours.  HbA1C: Hgb A1c MFr Bld  Date/Time Value Ref Range Status  11/23/2022 02:33 PM 8.9 (H) 4.8 - 5.6 % Final    Comment:             Prediabetes: 5.7 - 6.4          Diabetes: >6.4          Glycemic control for adults with diabetes: <7.0   08/29/2022 12:14 PM 13.4 (H) 4.8 - 5.6 % Final    Comment:             Prediabetes: 5.7 - 6.4          Diabetes: >6.4          Glycemic control for adults with diabetes: <7.0     CBG: Recent Labs  Lab 11/27/22 2028 11/28/22 0733 11/28/22 1151 11/28/22 1622 11/28/22 2151  GLUCAP 140* 114* 117* 115* 120*       Critical care time: n/a    Melody Comas, MD [facility] Office: [phone]   See Amion for personal pager PCCM on call pager [phone] until 7pm. Please call Elink 7p-7a. [phone]

## 2022-11-29 NOTE — Progress Notes (Signed)
Summary: Roy Andrews is a 52 yo Spanish speaking person living with significant medical history of uncontrolled DM, HTN, HLD, Obesity, OSA not currently using cPAPprior COVID infection requiring ICU admission and 4 months of supplemental O2 requirement that was sent to the ED after evaluation at his PCP revealed an abnormal HS troponin after evaluation for dyspnea on exertion and bilateral midback pain.   Subjective:  Patient in negative pressure room under airborne precautions. Gold TB test negative, will discuss with infectious disease due to low concern for active TB infection.   Patient expressed anxiety and nausea this morning. Feels anxious over pending lab results and upcoming bronchoscopy. Nausea started after drinking contrast last night, slightly improved this morning. Denies CP, SOB, or abdominal pain. No changes to BM or urination- interrupted stream but not bad. Reported overall decreased PO intake been trying to eat more of the hospital food including eggs and bacon this morning for breakfast. Patient willing to take PRN meds for anxiety and nausea, will try to get family/wife to visit to raise his spirits. Wife likely coming to hospital on Wednesday 7/24.   Objective:  Vital signs in last 24 hours: Vitals:   11/28/22 1619 11/28/22 2152 11/29/22 0446 11/29/22 0942  BP: 119/83 131/79 109/68 114/66  Pulse: 88 88 92 83  Resp:  18 19 18   Temp: 98.3 F (36.8 C) 98.7 F (37.1 C) 97.9 F (36.6 C) 98.2 F (36.8 C)  TempSrc: Oral Oral Oral   SpO2: 95% 97% 93% 93%  Weight:      Height:          Latest Ref Rng & Units 11/29/2022    2:12 AM 11/28/2022    2:38 AM 11/27/2022    7:45 AM  CBC  WBC 4.0 - 10.5 K/uL 6.6  6.6  5.7   Hemoglobin 13.0 - 17.0 g/dL 62.9  52.8  41.3   Hematocrit 39.0 - 52.0 % 39.9  40.1  40.3   Platelets 150 - 400 K/uL 193  181  175        Latest Ref Rng & Units 11/29/2022    2:12 AM 11/28/2022    2:38 AM 11/27/2022    7:45 AM  CMP   Glucose 70 - 99 mg/dL 244  96  010   BUN 6 - 20 mg/dL 30  34  33   Creatinine 0.61 - 1.24 mg/dL 2.72  5.36  6.44   Sodium 135 - 145 mmol/L 130  133  132   Potassium 3.5 - 5.1 mmol/L 4.2  4.4  5.0   Chloride 98 - 111 mmol/L 96  99  101   CO2 22 - 32 mmol/L 26  25  23    Calcium 8.9 - 10.3 mg/dL 9.6  9.8  03.4   Total Protein 6.5 - 8.1 g/dL   7.0   Total Bilirubin 0.3 - 1.2 mg/dL   0.7   Alkaline Phos 38 - 126 U/L   104   AST 15 - 41 U/L   21   ALT 0 - 44 U/L   33    ANA Positive, ANA titers pending ACE 25, 14 Quantiferon TB Gold - negative Lysozyme, serum- in process  CT Abdomen/Pelvis 7/22 COMPARISON:  Only relevant prior study is a recent CTA chest 11/26/2022. No prior abdomen and pelvis CT.  IMPRESSION: 1. Stable 1.8 x 0.8 cm thickened linear band opacity with spiculations in the posterior right middle lobe abutting the fissure. This could be  due to scarring or neoplasm. 2. Two 3 mm nodules in the right lower lung field. 3. Slightly prominent preesophageal lymph node measuring 1 cm in short axis. 4. Mild hepatosplenomegaly with mild hepatic steatosis. 5. Aortic atherosclerosis. 6. Umbilical and right inguinal fat hernias. 7. Mild prostatomegaly. 8. Uncomplicated diverticulosis. 9. Area of evolving fat necrosis overlying the distal sacrum right of the midline subcutaneously. 10. No primary or metastatic neoplasm grossly visible in the abdomen or pelvis.  Physical Exam Constitutional: Patient resting in bed in no acute distress, on room air.  CV: RRR, no rubs, murmurs, or gallops, trace to none LE edema Pulmonary: Clear bilaterally, normal respiratory effort.  Abdominal: Soft, non-tender, non-distended; positive bowel sounds.  Skin: Warm and dry. Bilateral lower legs c/w lipodermatosclerosis.  Psych: Some anxiety, normal affect. Continues to express concern over further workup and upcoming procedure given month-long ICU experience a few years ago.    Assessment/Plan:  Principal Problem:   AKI (acute kidney injury) (HCC) Active Problems:   Primary hypertension   Type 2 diabetes mellitus with hyperglycemia, without long-term current use of insulin (HCC)   Class 3 severe obesity due to excess calories with serious comorbidity and body mass index (BMI) of 45.0 to 49.9 in adult [facility])   Dyslipidemia associated with type 2 diabetes mellitus (HCC)   Diabetic peripheral neuropathy associated with type 2 diabetes mellitus (HCC)   Hyperkalemia   Mediastinal lymphadenopathy   OSA (obstructive sleep apnea)   Hypercalcemia   Elevated alkaline phosphatase level  Patient is a 52 yo Spanish speaking person living with significant medical history of uncontrolled DM, HTN, HLD, Obesity, OSA who presented with abnormal labs, worsening dyspnea, chest pain, and 18 lb unintentional weight loss. He was admitted for AKI, which allowed for further workup of abnormal labs and symptoms. ACS ruled out in the ED.   AKI Hyperkalemia- resolved Hyponatremia  Patient with longstanding hypertension with intermittent adherence to outpatient medications for blood pressure and diabetes. Patient recently started on Jardiance and recently taking all prescribed medications, some renally cleared, at around the same time (~3 weeks ago). Patient received intravenous contrast for imaging as well. Renal ultrasound 7/20 showing no mass or hydronephrosis, echogenicity within normal limits, no mass or hydronephrosis visualized. Only recently started treating diabetes more consistently, A1C 4/22 13.4, A1C 7/17 8.9.    Will hold nephrotoxic and renally cleared medications at this time. Given one 500 NS bolus 7/20. Tele unremarkable. Cr. 1.55 (1.65 yesterday). Continue to encourage PO intake of water/fluids. Potassium improved today (4.2).   Sodium trending down 134 > 132, > 133 > 130. Patient reporting decreased oral intake. Trace edema b/l lower extremities. Will monitor. Encouraged  PO intake.   Plan:  - Telemetry  - Monitor labs and replete as necessary  - Hold nephrotoxic and renally cleared medications    Mediastinal LAD Bilateral hilar LAD Hypercalcemia  Dyspnea on exertion OSA not on cPAP Patient with progressive worsening in dyspnea over the past month and +ROS for B symptoms. Now with CT chest findings of mediastinal and bilateral LN enlargement concerning for sarcoidosis, lymphoma, metastatic disease, which could be contributing to worsening dyspnea. TB less likely given negative Quantiferon TB Gold  test resulted 7/23 (patient has been in the [place] for the past 21 years, emigrated from [place]). With recent history of worsening renal function, will assess for glomerulonephritis as well. No history of prior colonoscopy.   Review of his labs one year ago without sign of hypercalcemia present on repeat  studies for the past 3 days, calcium 9.6 today. Corrected calcium (albumin 3.3) = 10.2 (10.4 7/22).  PCCM consulted in the ED, appreciate their guidance for further work-up (including infectious, inflammatory, and malignancy) while patient is admitted. Per Dr. Lanora Manis note, planning on bronchoscopy to take biopsy sample of lymph nodes and evaluate for infection, inflammation, and cancer. Pending scheduling here, per patient the procedure will be sometime this week.   Plan:  - Follow up on scheduling for bronchoscopy - Follow up remaining labs and PCCM recommendations - Monitor respiratory status and telemetry   Dyspnea on exertion Atypical chest pain OSA not on cPA Pulmonary HTN Differential diagnosis above potentially contributing to DOE. However, patient with acute worsening in DOE and LE edema also concerning for cardiac etiologies such as acute heart failure. OSA is untreated with CTA with findings suggestive of pulmonary HTN.   Patient remains HDS and on room air. Patient denying CP or SOB. BNP normal. Echo on admission showing LVEF 60 to 65%, no aortic  stenosis, trivial MR regurgitation, and right atrial pressure of 3 mmHg. No prior echocardiogram available for comparison. Trace edema b/l lower extremities. Encouraged patient to sit up in chair.   Plan:  - Monitor fluid status - Will hold off on trialing diuresis in setting of AKI - Daily weights and I/O  Anxiety Will try hydroxyzine PRN for anxiety. May improve nausea, if not can add Zofran or other anti emetic. EKG 7/22 with QT/QTcB 320/380.  - Monitor, try PRN hydroxyzine  Fat necrosis on CT Area of evolving fat necrosis overlying the distal sacrum right of the midline subcutaneously on CT abdomen/pelvis 7/22. No erythema, edema, or skin changes on lower back/sacrum or buttocks. Will monitor. Encouraged patient to sit up in chair throughout the day.    RML nodule Spiculated appearing nodular opacity in the posterior right middle lobe abutting the major fissure measuring 1.9 x 1.3 cm seen on CT chest PE study. Ddx concerning for infectious vs inflammation vs malignancy. Patient does not carry a former tobacco use. Ordered CT abdomen/pelvis without masses, confirmed previously visualized nodules and lymphadenopathy. PCCM planning bronchoscopy while patient is here.   Plan:  - F/u bronchoscopy - May need further imaging with PET or EBUS, perhaps during this admission given insurance status limiting outpatient follow-up.    Elevated alkaline phosphatase - normalized No changes in liver enzymes. Alk phos 108 from 135, WNL. AST 21, ALT 37, WNL. Vitamin D levels 14.75 (low), vitamin D 1-25 81.6 (elevated). GGT 178. Isolated, elevated GGT could be caused by alcohol intake, poor/fatty diet, and contaminants. Most likely related to patient's eating habits, BMI 48.09 but will continue to monitor.   Plan: - F/u pending labs  - Will consider RUQ US/imaging if labs trend towards concern    HTN HLD Stable on admission. Will hold home Lisinopril in setting of AKI. -Monitor BP -Continue Lipitor  40 mg daily   Type 2 DM  Non insulin dependent. A1c 8.9 3 days ago. - Hold home medications - SSI severe   Diet: Carb-Modified with fluid restriction VTE: Enoxaparin Code: Full  Prior to Admission Living Arrangement: Home Anticipated Discharge Location: Home Barriers to Discharge: Medical management/work-up Dispo: Anticipated discharge in approximately 1-2 day(s).   Philomena Doheny, MD, PGY-1 11/29/2022, 10:29 AM Pager: [phone] After 5pm on weekdays and 1pm on weekends: On Call pager [phone]

## 2022-11-30 LAB — RENAL FUNCTION PANEL
Albumin: 3.5 g/dL (ref 3.5–5.0)
Anion gap: 13 (ref 5–15)
BUN: 31 mg/dL — ABNORMAL HIGH (ref 6–20)
CO2: 25 mmol/L (ref 22–32)
Calcium: 9.9 mg/dL (ref 8.9–10.3)
Chloride: 95 mmol/L — ABNORMAL LOW (ref 98–111)
Creatinine, Ser: 1.65 mg/dL — ABNORMAL HIGH (ref 0.61–1.24)
GFR, Estimated: 50 mL/min — ABNORMAL LOW (ref >60)
Glucose, Bld: 123 mg/dL — ABNORMAL HIGH (ref 70–99)
Phosphorus: 4.9 mg/dL — ABNORMAL HIGH (ref 2.5–4.6)
Potassium: 4.2 mmol/L (ref 3.5–5.1)
Sodium: 133 mmol/L — ABNORMAL LOW (ref 135–145)

## 2022-11-30 LAB — LYSOZYME, SERUM: Lysozyme: 17.5 ug/mL — ABNORMAL HIGH (ref 3.4–7.6)

## 2022-11-30 LAB — GLUCOSE, CAPILLARY
Glucose-Capillary: 139 mg/dL — ABNORMAL HIGH (ref 70–99)
Glucose-Capillary: 121 mg/dL — ABNORMAL HIGH (ref 70–99)
Glucose-Capillary: 146 mg/dL — ABNORMAL HIGH (ref 70–99)
Glucose-Capillary: 111 mg/dL — ABNORMAL HIGH (ref 70–99)

## 2022-11-30 LAB — MAGNESIUM: Magnesium: 1.7 mg/dL (ref 1.7–2.4)

## 2022-11-30 NOTE — Progress Notes (Signed)
NAME:  Roy Andrews, MRN:  [id number], DOB:  1970-12-27, LOS: 3 ADMISSION DATE:  11/26/2022, CONSULTATION DATE:  11/26/22 REFERRING MD:  Dr. Criselda Peaches CHIEF COMPLAINT:  Abnormal CT Chest   History of Present Illness:  Roy Andrews is a 52 y.o.-male with a past medical history significant for diabetes, HTN, and HLD who presented to the ED from PCP due to abnormal labs, including elevated high-sensitivity troponin..  Patient denied any chest pain but did report mild back pain with Discomfort mostly located between her shoulder blades.  Also reports fatigue, intermittent dyspnea, and pleuritic pain.  On ED arrival patient was seen hemodynamically stable without signs of hypoxia. Labor significant for AKI and mild hyponatremia and hyperkalemia and HS trop 27. CTA chest was negative for PE but significant for mediastinal and hilar lymphadenopathy concerning for malignancy or  possible sarcoidosis. PCCM consulted for further workup.   He is a never smoker. No family history of autoimmune disease. He works in [employer], mostly pressure washing currently. No alcohol use. He does feel like he has trouble swallowing solids and liquids at times. He has been in the [place] for the past 20 years, with no travel outside of the country. No known TB exposures. No history of jail/prison time.   History of pneumonia in 2008 - hospitalized. History of covid pneumonia in 2020 - hospitalized.   Pertinent  Medical History  DMII HLD Obesity OSA  Significant Hospital Events: Including procedures, antibiotic start and stop dates in addition to other pertinent events   QuantiFERON gold 11/27/2022 >> negative  Interim History / Subjective:  Spoke with the patient with the assistance of Spanish interpretation Reviewed rationale for bronchoscopy   Objective   Blood pressure 104/63, pulse 92, temperature 98.2 F (36.8 C), resp. rate 19, height 5\' 11"  (1.803 m), weight (!) 156.4 kg, SpO2 93%.         Intake/Output Summary (Last 24 hours) at 11/30/2022 1441 Last data filed at 11/30/2022 0900 Gross per 24 hour  Intake 460 ml  Output 0 ml  Net 460 ml   Filed Weights   11/26/22 1133 11/26/22 1915 11/27/22 0500  Weight: (!) 153.8 kg (!) 156.2 kg (!) 156.4 kg    Examination: General: No distress.  Obese gentleman sitting up in bed HENT: Oropharynx clear, strong voice, no stridor Lungs: Clear bilaterally Cardiovascular: rrr, no murmurs Abdomen: soft, non-tender, non-distended Extremities: warm, no edema Neuro: Awake, alert, appropriate, answers questions, moves all extremities, follows commands   Resolved Hospital Problem list     Assessment & Plan:  Mediastinal adenopathy - bulkly RML nodule 1.9cm Positive ANA  Plan: -Discussed bronchoscopy, transbronchial biopsy of a right middle lobe nodule, EBUS biopsies of mediastinal adenopathy with the patient.  He understands the rationale, risks, benefits and like to proceed. -I will make him n.p.o. at midnight on Friday -I will hold his DVT prophylaxis in preparation for procedure -He has 1 Negative QuantiFERON and another that is equivocal, low suspicion for TB.  Could consider discontinuing his respiratory isolation     Best Practice (right click and "Reselect all SmartList Selections" daily)   Per Primary  Labs   CBC: Recent Labs  Lab 11/26/22 1136 11/27/22 0745 11/28/22 0238 11/29/22 0212  WBC 5.7 5.7 6.6 6.6  HGB 14.5 13.4 13.5 13.7  HCT 43.8 40.3 40.1 39.9  MCV 90.5 88.6 90.1 86.4  PLT 193 175 181 193    Basic Metabolic Panel: Recent Labs  Lab 11/26/22 1136 11/27/22  0745 11/28/22 0238 11/29/22 0212 11/30/22 0408  NA 134* 132* 133* 130* 133*  K 5.7* 5.0 4.4 4.2 4.2  CL 100 101 99 96* 95*  CO2 22 23 25 26 25   GLUCOSE 127* 117* 96 102* 123*  BUN 38* 33* 34* 30* 31*  CREATININE 1.56* 1.58* 1.65* 1.55* 1.65*  CALCIUM 10.6* 10.0 9.8 9.6 9.9  MG  --   --   --   --  1.7  PHOS  --  4.4 4.5 4.1 4.9*    GFR: Estimated Creatinine Clearance: 81.6 mL/min (A) (by C-G formula based on SCr of 1.65 mg/dL (H)). Recent Labs  Lab 11/26/22 1136 11/27/22 0745 11/28/22 0238 11/29/22 0212  WBC 5.7 5.7 6.6 6.6  LATICACIDVEN  --  1.1  --   --     Liver Function Tests: Recent Labs  Lab 11/24/22 1625 11/26/22 1929 11/27/22 0745 11/28/22 0238 11/29/22 0212 11/30/22 0408  AST 22 21 21   --   --   --   ALT 36 37 33  --   --   --   ALKPHOS 135* 108 104  --   --   --   BILITOT 0.4 0.7 0.7  --   --   --   PROT 7.7 7.6 7.0  --   --   --   ALBUMIN 4.0* 3.5 3.1* 3.3* 3.3* 3.5   No results for input(s): "LIPASE", "AMYLASE" in the last 168 hours. No results for input(s): "AMMONIA" in the last 168 hours.  ABG    Component Value Date/Time   PHART 7.39 11/27/2022 0500   PCO2ART 39 11/27/2022 0500   PO2ART 66 (L) 11/27/2022 0500   HCO3 23.6 11/27/2022 0500   TCO2 31 10/14/2018 0554   ACIDBASEDEF 1.2 11/27/2022 0500   O2SAT 94.9 11/27/2022 0500     Coagulation Profile: No results for input(s): "INR", "PROTIME" in the last 168 hours.  Cardiac Enzymes: No results for input(s): "CKTOTAL", "CKMB", "CKMBINDEX", "TROPONINI" in the last 168 hours.  HbA1C: Hgb A1c MFr Bld  Date/Time Value Ref Range Status  11/23/2022 02:33 PM 8.9 (H) 4.8 - 5.6 % Final    Comment:             Prediabetes: 5.7 - 6.4          Diabetes: >6.4          Glycemic control for adults with diabetes: <7.0   08/29/2022 12:14 PM 13.4 (H) 4.8 - 5.6 % Final    Comment:             Prediabetes: 5.7 - 6.4          Diabetes: >6.4          Glycemic control for adults with diabetes: <7.0     CBG: Recent Labs  Lab 11/29/22 1146 11/29/22 1652 11/29/22 2110 11/30/22 0741 11/30/22 1123  GLUCAP 124* 117* 128* 139* 121*       Critical care time: n/a    Levy Pupa, MD, PhD 11/30/2022, 2:52 PM [facility] [phone] or if no answer before 7:00PM call [phone] For any issues after  7:00PM please call eLink [phone]

## 2022-11-30 NOTE — H&P (View-Only) (Signed)
NAME:  Roy Andrews, MRN:  [id number], DOB:  Sep 16, 1970, LOS: 3 ADMISSION DATE:  11/26/2022, CONSULTATION DATE:  11/26/22 REFERRING MD:  Dr. Criselda Peaches CHIEF COMPLAINT:  Abnormal CT Chest   History of Present Illness:  Roy Andrews is a 52 y.o.-male with a past medical history significant for diabetes, HTN, and HLD who presented to the ED from PCP due to abnormal labs, including elevated high-sensitivity troponin..  Patient denied any chest pain but did report mild back pain with Discomfort mostly located between her shoulder blades.  Also reports fatigue, intermittent dyspnea, and pleuritic pain.  On ED arrival patient was seen hemodynamically stable without signs of hypoxia. Labor significant for AKI and mild hyponatremia and hyperkalemia and HS trop 27. CTA chest was negative for PE but significant for mediastinal and hilar lymphadenopathy concerning for malignancy or  possible sarcoidosis. PCCM consulted for further workup.   He is a never smoker. No family history of autoimmune disease. He works in [employer], mostly pressure washing currently. No alcohol use. He does feel like he has trouble swallowing solids and liquids at times. He has been in the [place] for the past 20 years, with no travel outside of the country. No known TB exposures. No history of jail/prison time.   History of pneumonia in 2008 - hospitalized. History of covid pneumonia in 2020 - hospitalized.   Pertinent  Medical History  DMII HLD Obesity OSA  Significant Hospital Events: Including procedures, antibiotic start and stop dates in addition to other pertinent events   QuantiFERON gold 11/27/2022 >> negative  Interim History / Subjective:  Spoke with the patient with the assistance of Spanish interpretation Reviewed rationale for bronchoscopy   Objective   Blood pressure 104/63, pulse 92, temperature 98.2 F (36.8 C), resp. rate 19, height 5\' 11"  (1.803 m), weight (!) 156.4 kg, SpO2 93%.         Intake/Output Summary (Last 24 hours) at 11/30/2022 1441 Last data filed at 11/30/2022 0900 Gross per 24 hour  Intake 460 ml  Output 0 ml  Net 460 ml   Filed Weights   11/26/22 1133 11/26/22 1915 11/27/22 0500  Weight: (!) 153.8 kg (!) 156.2 kg (!) 156.4 kg    Examination: General: No distress.  Obese gentleman sitting up in bed HENT: Oropharynx clear, strong voice, no stridor Lungs: Clear bilaterally Cardiovascular: rrr, no murmurs Abdomen: soft, non-tender, non-distended Extremities: warm, no edema Neuro: Awake, alert, appropriate, answers questions, moves all extremities, follows commands   Resolved Hospital Problem list     Assessment & Plan:  Mediastinal adenopathy - bulkly RML nodule 1.9cm Positive ANA  Plan: -Discussed bronchoscopy, transbronchial biopsy of a right middle lobe nodule, EBUS biopsies of mediastinal adenopathy with the patient.  He understands the rationale, risks, benefits and like to proceed. -I will make him n.p.o. at midnight on Friday -I will hold his DVT prophylaxis in preparation for procedure -He has 1 Negative QuantiFERON and another that is equivocal, low suspicion for TB.  Could consider discontinuing his respiratory isolation     Best Practice (right click and "Reselect all SmartList Selections" daily)   Per Primary  Labs   CBC: Recent Labs  Lab 11/26/22 1136 11/27/22 0745 11/28/22 0238 11/29/22 0212  WBC 5.7 5.7 6.6 6.6  HGB 14.5 13.4 13.5 13.7  HCT 43.8 40.3 40.1 39.9  MCV 90.5 88.6 90.1 86.4  PLT 193 175 181 193    Basic Metabolic Panel: Recent Labs  Lab 11/26/22 1136 11/27/22  0745 11/28/22 0238 11/29/22 0212 11/30/22 0408  NA 134* 132* 133* 130* 133*  K 5.7* 5.0 4.4 4.2 4.2  CL 100 101 99 96* 95*  CO2 22 23 25 26 25   GLUCOSE 127* 117* 96 102* 123*  BUN 38* 33* 34* 30* 31*  CREATININE 1.56* 1.58* 1.65* 1.55* 1.65*  CALCIUM 10.6* 10.0 9.8 9.6 9.9  MG  --   --   --   --  1.7  PHOS  --  4.4 4.5 4.1 4.9*    GFR: Estimated Creatinine Clearance: 81.6 mL/min (A) (by C-G formula based on SCr of 1.65 mg/dL (H)). Recent Labs  Lab 11/26/22 1136 11/27/22 0745 11/28/22 0238 11/29/22 0212  WBC 5.7 5.7 6.6 6.6  LATICACIDVEN  --  1.1  --   --     Liver Function Tests: Recent Labs  Lab 11/24/22 1625 11/26/22 1929 11/27/22 0745 11/28/22 0238 11/29/22 0212 11/30/22 0408  AST 22 21 21   --   --   --   ALT 36 37 33  --   --   --   ALKPHOS 135* 108 104  --   --   --   BILITOT 0.4 0.7 0.7  --   --   --   PROT 7.7 7.6 7.0  --   --   --   ALBUMIN 4.0* 3.5 3.1* 3.3* 3.3* 3.5   No results for input(s): "LIPASE", "AMYLASE" in the last 168 hours. No results for input(s): "AMMONIA" in the last 168 hours.  ABG    Component Value Date/Time   PHART 7.39 11/27/2022 0500   PCO2ART 39 11/27/2022 0500   PO2ART 66 (L) 11/27/2022 0500   HCO3 23.6 11/27/2022 0500   TCO2 31 10/14/2018 0554   ACIDBASEDEF 1.2 11/27/2022 0500   O2SAT 94.9 11/27/2022 0500     Coagulation Profile: No results for input(s): "INR", "PROTIME" in the last 168 hours.  Cardiac Enzymes: No results for input(s): "CKTOTAL", "CKMB", "CKMBINDEX", "TROPONINI" in the last 168 hours.  HbA1C: Hgb A1c MFr Bld  Date/Time Value Ref Range Status  11/23/2022 02:33 PM 8.9 (H) 4.8 - 5.6 % Final    Comment:             Prediabetes: 5.7 - 6.4          Diabetes: >6.4          Glycemic control for adults with diabetes: <7.0   08/29/2022 12:14 PM 13.4 (H) 4.8 - 5.6 % Final    Comment:             Prediabetes: 5.7 - 6.4          Diabetes: >6.4          Glycemic control for adults with diabetes: <7.0     CBG: Recent Labs  Lab 11/29/22 1146 11/29/22 1652 11/29/22 2110 11/30/22 0741 11/30/22 1123  GLUCAP 124* 117* 128* 139* 121*       Critical care time: n/a    Levy Pupa, MD, PhD 11/30/2022, 2:52 PM [facility] [phone] or if no answer before 7:00PM call [phone] For any issues after  7:00PM please call eLink [phone]

## 2022-11-30 NOTE — Progress Notes (Signed)
Summary: Roy Andrews is a 52 yo Spanish speaking person living with significant medical history of uncontrolled DM, HTN, HLD, Obesity, OSA not currently using cPAPprior COVID infection requiring ICU admission and 4 months of supplemental O2 requirement that was sent to the ED after evaluation at his PCP revealed an abnormal HS troponin after evaluation for dyspnea on exertion and bilateral midback pain.   Subjective:  No overnight events reported. Patient's wife [place] at bedside. Patient very anxious about upcoming bronchoscopy and what the findings in his lungs may be related to. Denies CP, SOB, abdominal pain, or dysuria. Has not had any issues getting up to walk to the bathroom. Nausea improved over the day yesterday with Zofran. Still anxious, has not taken any of the PRN hydroxyzine. Not interested in having anything to help with sleep.   Dr. Delton Coombes arrived to discuss bronchoscopy in person with the patient regarding indications, benefits, and risks. Patient and wife were able to ask all their questions related to the procedure.   Objective:  Vital signs in last 24 hours: Vitals:   11/29/22 1654 11/29/22 2111 11/30/22 0401 11/30/22 0740  BP: (!) 88/61 102/62 106/64 104/63  Pulse: 85 83 97 92  Resp: 18 18 20 19   Temp: 98.5 F (36.9 C) 98 F (36.7 C) 98.1 F (36.7 C) 98.2 F (36.8 C)  TempSrc:   Oral   SpO2: 94% 99% 90% 93%  Weight:      Height:          Latest Ref Rng & Units 11/29/2022    2:12 AM 11/28/2022    2:38 AM 11/27/2022    7:45 AM  CBC  WBC 4.0 - 10.5 K/uL 6.6  6.6  5.7   Hemoglobin 13.0 - 17.0 g/dL 16.1  09.6  04.5   Hematocrit 39.0 - 52.0 % 39.9  40.1  40.3   Platelets 150 - 400 K/uL 193  181  175        Latest Ref Rng & Units 11/30/2022    4:08 AM 11/29/2022    2:12 AM 11/28/2022    2:38 AM  CMP  Glucose 70 - 99 mg/dL 409  811  96   BUN 6 - 20 mg/dL 31  30  34   Creatinine 0.61 - 1.24 mg/dL 9.14  7.82  9.56   Sodium 135 - 145 mmol/L 133  130   133   Potassium 3.5 - 5.1 mmol/L 4.2  4.2  4.4   Chloride 98 - 111 mmol/L 95  96  99   CO2 22 - 32 mmol/L 25  26  25    Calcium 8.9 - 10.3 mg/dL 9.9  9.6  9.8    ANA Positive, ANA titers pending Lysozyme, serum- in process  Physical Exam Constitutional: Resting in bed comfortably, conversing appropriately.  CV: RRR, no rubs/murmurs/gallops. No B/L LE edema on exam.  Pulmonary: Clear bilaterally, normal respiratory effort on room air.  Abdominal: Soft, non-tender, non-distended, positive bowel sounds.  Skin: Warm and dry. Unchanged n/l LE lipodermatosclerosis.  Psych: Normal mood and affect.   Assessment/Plan:  Principal Problem:   AKI (acute kidney injury) (HCC) Active Problems:   Primary hypertension   Type 2 diabetes mellitus with hyperglycemia, without long-term current use of insulin (HCC)   Class 3 severe obesity due to excess calories with serious comorbidity and body mass index (BMI) of 45.0 to 49.9 in adult [facility])   Dyslipidemia associated with type 2 diabetes mellitus (HCC)   Diabetic  peripheral neuropathy associated with type 2 diabetes mellitus (HCC)   Hyperkalemia   Mediastinal lymphadenopathy   OSA (obstructive sleep apnea)   Hypercalcemia   Elevated alkaline phosphatase level  Patient is a 52 yo Spanish speaking person living with significant medical history of uncontrolled DM, HTN, HLD, Obesity, OSA who presented with abnormal labs, worsening dyspnea, chest pain, and 18 lb unintentional weight loss. He was admitted for AKI, which allowed for further workup of abnormal labs and symptoms. ACS ruled out in the ED.   AKI Hyperkalemia- resolved Hyponatremia  Patient with longstanding hypertension with intermittent adherence to outpatient medications for blood pressure and diabetes. Patient recently started on Jardiance and recently taking all prescribed medications, some renally cleared, at around the same time (~3 weeks ago). Patient received intravenous contrast for  imaging as well. Renal ultrasound 7/20 showing no mass or hydronephrosis, echogenicity within normal limits, no mass or hydronephrosis visualized. Only recently started treating diabetes more consistently, A1C 4/22 13.4, A1C 7/17 8.9.    Will hold nephrotoxic and renally cleared medications at this time. Given one 500 NS bolus 7/20. Tele unremarkable. Cr. 1.65 (1.65 on admission, improved HD #1 1.55). Creatinine 5.23.23 and 03/15/2021 of 0.95. Continue to encourage PO intake of water/fluids. Potassium stable 4.2.   Sodium initially trending down 134 > 132, > 133 > 130. Today sodium 133. Patient reported trying to eating regular meals while here. No edema b/l lower extremities. Will monitor. Encouraged PO intake.   Plan:  - Telemetry  - Monitor labs and replete as necessary  - Hold nephrotoxic and renally cleared medications    Mediastinal LAD Bilateral hilar LAD Hypercalcemia  Dyspnea on exertion OSA not on cPAP Patient with progressive worsening in dyspnea over the past month and +ROS for B symptoms. Now with CT chest findings of mediastinal and bilateral LN enlargement concerning for sarcoidosis, lymphoma, metastatic disease, which could be contributing to worsening dyspnea. TB less likely given negative Quantiferon TB Gold  test resulted 7/23 (patient has been in the [place] for the past 21 years, emigrated from [place]). With recent history of worsening renal function, will assess for glomerulonephritis as well. No history of prior colonoscopy.   Review of his labs one year ago without sign of hypercalcemia present on repeat studies for the past 3 days, calcium 9.9 today. Corrected calcium 7/22 10.4  7/23 10.2  7/24 10.3. No changes on telemetry.   PCCM consulted in the ED, Ddx infectious vs  inflammatory vs malignancy. Bronchoscopy on schedule 3:00 PM with Dr. Delton Coombes for Friday 12/02/2022. Per Dr. Delton Coombes, patient will be NPO Thursday at midnight and will hold DVT prophylaxis.  Plan:  - Follow up  on PCCM recommendations ahead of bronchoscopy - Follow up remaining labs and PCCM recommendations - Monitor respiratory status and telemetry   Dyspnea on exertion Atypical chest pain OSA not on cPA Pulmonary HTN Differential diagnosis above potentially contributing to DOE. However, patient with acute worsening in DOE and LE edema also concerning for cardiac etiologies such as acute heart failure. OSA is untreated with CTA with findings suggestive of pulmonary HTN.   Patient remains HDS and on room air. Patient denying CP or SOB. BNP normal. Echo LVEF 60 to 65%, increased RA pressure. No prior echocardiogram. Trace edema b/l lower extremities. Encouraged patient to sit up in chair, walk around room. Mobility limited by telemetry.   Plan:  - Monitor fluid status - Will hold off on trialing diuresis in setting of AKI - Daily weights  and I/O  Anxiety Will try hydroxyzine PRN for anxiety. EKG 7/22 with QT/QTcB 320/380. Zofran 4 mg 1x dose for nausea. Patient not interested in sleep aid at this time.  - Monitor, try PRN hydroxyzine (not given/requested 7/23) - Repeat EKG if ongoing need for Zofran or antiemetic  Fat necrosis on CT Area of evolving fat necrosis overlying the distal sacrum right of the midline subcutaneously on CT abdomen/pelvis 7/22. No erythema, edema, or skin changes on lower back/sacrum or buttocks. Will monitor. Encouraged patient to sit up in chair throughout the day.    RML nodule Spiculated appearing nodular opacity in the posterior right middle lobe abutting the major fissure measuring 1.9 x 1.3 cm seen on CT chest PE study. Ddx concerning for infectious vs inflammation vs malignancy. Patient does not carry a former tobacco use. Ordered CT abdomen/pelvis without masses, confirmed previously visualized nodules and lymphadenopathy. PCCM planning bronchoscopy while patient is here.   Plan:  - F/u bronchoscopy - May need further imaging with PET or EBUS, perhaps during  this admission given insurance status limiting outpatient follow-up.    Elevated alkaline phosphatase - normalized No changes in liver enzymes. Alk phos 108 from 135, WNL. AST 21, ALT 37, WNL. Vitamin D levels 14.75 (low), vitamin D 1-25 81.6 (elevated). GGT 178. Isolated, elevated GGT could be caused by alcohol intake, poor/fatty diet, and contaminants. Most likely related to patient's eating habits, BMI 48.09 but will continue to monitor.   Plan: - F/u pending labs  - Will consider RUQ US/imaging if labs trend towards concern    HTN HLD Stable on admission. Will hold home Lisinopril in setting of AKI. -Monitor BP -Continue Lipitor 40 mg daily   Type 2 DM  Non insulin dependent. A1c 8.9 3 days ago. - Hold home medications - SSI severe   Diet: Carb-Modified with fluid restriction VTE: Enoxaparin Code: Full  Prior to Admission Living Arrangement: Home Anticipated Discharge Location: Home Barriers to Discharge: Medical management/work-up Dispo: Anticipated discharge in approximately 1-2 day(s).   Philomena Doheny, MD, PGY-1 11/30/2022, 8:39 AM Pager: [phone] After 5pm on weekdays and 1pm on weekends: On Call pager [phone]

## 2022-12-01 DIAGNOSIS — R0600 Dyspnea, unspecified: Secondary | ICD-10-CM

## 2022-12-01 DIAGNOSIS — G4733 Obstructive sleep apnea (adult) (pediatric): Secondary | ICD-10-CM

## 2022-12-01 LAB — RENAL FUNCTION PANEL
Anion gap: 12 (ref 5–15)
BUN: 33 mg/dL — ABNORMAL HIGH (ref 6–20)
CO2: 23 mmol/L (ref 22–32)
Calcium: 9.5 mg/dL (ref 8.9–10.3)
Chloride: 95 mmol/L — ABNORMAL LOW (ref 98–111)
Creatinine, Ser: 1.73 mg/dL — ABNORMAL HIGH (ref 0.61–1.24)
GFR, Estimated: 47 mL/min — ABNORMAL LOW (ref >60)
Glucose, Bld: 100 mg/dL — ABNORMAL HIGH (ref 70–99)
Phosphorus: 4.5 mg/dL (ref 2.5–4.6)
Potassium: 4.1 mmol/L (ref 3.5–5.1)
Sodium: 130 mmol/L — ABNORMAL LOW (ref 135–145)

## 2022-12-01 LAB — URINALYSIS, COMPLETE (UACMP) WITH MICROSCOPIC
Bilirubin Urine: NEGATIVE
Glucose, UA: 150 mg/dL — AB
Hgb urine dipstick: NEGATIVE
Ketones, ur: NEGATIVE mg/dL
Leukocytes,Ua: NEGATIVE
Nitrite: NEGATIVE
Protein, ur: NEGATIVE mg/dL
Specific Gravity, Urine: 1.014 (ref 1.005–1.030)
pH: 5 (ref 5.0–8.0)

## 2022-12-01 LAB — GLUCOSE, CAPILLARY
Glucose-Capillary: 110 mg/dL — ABNORMAL HIGH (ref 70–99)
Glucose-Capillary: 149 mg/dL — ABNORMAL HIGH (ref 70–99)
Glucose-Capillary: 153 mg/dL — ABNORMAL HIGH (ref 70–99)
Glucose-Capillary: 169 mg/dL — ABNORMAL HIGH (ref 70–99)

## 2022-12-01 MED ORDER — METHOCARBAMOL 500 MG PO TABS
500.0000 mg | ORAL_TABLET | Freq: Four times a day (QID) | ORAL | Status: DC | PRN
  Administered 2022-12-01 – 2022-12-02 (×3): 500 mg via ORAL
  Filled 2022-12-01 (×3): qty 1

## 2022-12-01 MED ORDER — MAGNESIUM SULFATE 2 GM/50ML IV SOLN
2.0000 g | Freq: Once | INTRAVENOUS | Status: AC
  Administered 2022-12-01: 2 g via INTRAVENOUS
  Filled 2022-12-01: qty 50

## 2022-12-01 NOTE — Progress Notes (Addendum)
Summary: Roy Andrews is a 52 yo Spanish speaking person living with significant medical history of uncontrolled DM, HTN, HLD, Obesity, OSA not currently using cPAPprior COVID infection requiring ICU admission and 4 months of supplemental O2 requirement that was sent to the ED after evaluation at his PCP revealed an abnormal HS troponin after evaluation for dyspnea on exertion and bilateral midback pain.   Subjective:  No overnight events reported. Feeling ok this morning, denies CP, N/V, headache, anxiety, dysuria, or abdominal pain. Continues to endorse back pain described as muscle spasms that come and go. Has been tolerating PO intake. Tries to sit up in chairs available in room but spends most of his time on the bed. Messaged his nurse, Larita Fife, to see if we can get a reclining chair in the room, which may also improve his back pain.   Patient remains on precautions despite negative Quantiferon TB Gold due to ID policy.   Objective:  Vital signs in last 24 hours: Vitals:   11/30/22 0401 11/30/22 0740 11/30/22 1600 11/30/22 2032  BP: 106/64 104/63 103/65 107/73  Pulse: 97 92 80 92  Resp: 20 19 19 18   Temp: 98.1 F (36.7 C) 98.2 F (36.8 C) 98 F (36.7 C) 98.1 F (36.7 C)  TempSrc: Oral     SpO2: 90% 93% 93% 96%  Weight:      Height:          Latest Ref Rng & Units 11/29/2022    2:12 AM 11/28/2022    2:38 AM 11/27/2022    7:45 AM  CBC  WBC 4.0 - 10.5 K/uL 6.6  6.6  5.7   Hemoglobin 13.0 - 17.0 g/dL 16.1  09.6  04.5   Hematocrit 39.0 - 52.0 % 39.9  40.1  40.3   Platelets 150 - 400 K/uL 193  181  175        Latest Ref Rng & Units 12/01/2022    2:23 AM 11/30/2022    4:08 AM 11/29/2022    2:12 AM  CMP  Glucose 70 - 99 mg/dL 409  811  914   BUN 6 - 20 mg/dL 33  31  30   Creatinine 0.61 - 1.24 mg/dL 7.82  9.56  2.13   Sodium 135 - 145 mmol/L 130  133  130   Potassium 3.5 - 5.1 mmol/L 4.1  4.2  4.2   Chloride 98 - 111 mmol/L 95  95  96   CO2 22 - 32 mmol/L 23   25  26    Calcium 8.9 - 10.3 mg/dL 9.5  9.9  9.6    Lysozyme, serum- 17.5  Physical Exam Constitutional: Resting in bed in no acute distress.  CV: RRR, no rubs/murmurs/gallops; no b/l lower extremity edema. Pulmonary: Normal respiratory effort on room air, clear lungs bilaterally on auscultation.   Abdominal: Soft, non-tender, non-distended, positive bowel sounds. Skin: Warm and dry, unchanged lower extremity hyperpigmentation and changes c/w lipodermatosclerosis. Psych: Normal mood and affect.   Assessment/Plan:  Principal Problem:   AKI (acute kidney injury) (HCC) Active Problems:   Primary hypertension   Type 2 diabetes mellitus with hyperglycemia, without long-term current use of insulin (HCC)   Class 3 severe obesity due to excess calories with serious comorbidity and body mass index (BMI) of 45.0 to 49.9 in adult [facility])   Dyslipidemia associated with type 2 diabetes mellitus (HCC)   Diabetic peripheral neuropathy associated with type 2 diabetes mellitus (HCC)   Hyperkalemia   Mediastinal  lymphadenopathy   OSA (obstructive sleep apnea)   Hypercalcemia   Elevated alkaline phosphatase level  Patient is a 52 yo Spanish speaking person living with significant medical history of uncontrolled DM, HTN, HLD, Obesity, OSA who presented with abnormal labs, worsening dyspnea, chest pain, and 18 lb unintentional weight loss. He was admitted for AKI, which allowed for further workup of abnormal labs and symptoms. ACS ruled out in the ED.     Mediastinal & Hilar LAD, concerning for lymphoma vs. Metastatic nodal disease vs. sarcoid Hypercalcemia  Dyspnea on exertion OSA not on cPAP Patient with progressive worsening in dyspnea over the past month and +ROS for B symptoms. Now with CT chest findings of mediastinal and bilateral LN enlargement concerning for sarcoidosis, lymphoma, metastatic disease, which could be contributing to worsening dyspnea. TB less likely given negative Quantiferon TB  Gold  test resulted 7/23 (patient has been in the [place] for the past 21 years, emigrated from [place]). With recent history of worsening renal function, will assess for glomerulonephritis as well. No history of prior colonoscopy.   Review of his labs one year ago without sign of hypercalcemia, present on repeat studies on admission. Corrected calcium 7/22 10.4  7/23 10.2  7/24 10.3.  7/25 10.0. Stopped telemetry 7/25.   PCCM consulted in the ED, Ddx infectious vs  inflammatory vs malignancy. Bronchoscopy on schedule 3:00 PM with Dr. Delton Coombes for Friday 12/02/2022. Per Dr. Delton Coombes, patient will be NPO Thursday at midnight and will hold DVT prophylaxis. Patient not producing sputum and with low suspicion of TB, Dr. Delton Coombes (pulmonology) does not think AFB cultures q8H are necessary prior to bronchoscopy 7/26.   Plan:  - NPO at midnight - Stop DVT prophylaxis - F/u pulm recommendations  - Monitor respiratory status   AKI Hyperkalemia- resolved Hyponatremia  Patient with longstanding hypertension with intermittent adherence to outpatient medications for blood pressure and diabetes. Patient recently started on Jardiance and recently taking all prescribed medications, some renally cleared, at around the same time (~3 weeks ago). Patient received intravenous contrast for imaging as well. Renal ultrasound 7/20 showing no mass or hydronephrosis, echogenicity within normal limits, no mass or hydronephrosis visualized. Only recently started treating diabetes more consistently, A1C 4/22 13.4, A1C 7/17 8.9.    Creatinine 0.95 about a year ago. Cr today 7/25 of 1.73 up from 1.65. Received oral and IV contrast for CT abd/pelvis 7/23.   Potassium stable 4.1.   Sodium initially trending down 134 > 132, > 133 > 130 > 133. Today sodium 130. Patient reported trying to eating regular meals while here. No edema b/l lower extremities. Encouraged water intake. Patient with history c/w BPH, will get post-void [place]. Denies  dysuria but will get UA with microanalysis and monitor for other changes since UA on admission (normal).   Plan:  - Telemetry discontinued 7/25 - Monitor labs and replete as necessary  - Hold nephrotoxic and renally cleared medications     Dyspnea on exertion Atypical chest pain OSA not on cPA Pulmonary HTN Differential diagnosis above potentially contributing to DOE. However, patient with acute worsening in DOE and LE edema also concerning for cardiac etiologies such as acute heart failure. OSA is untreated with CTA with findings suggestive of pulmonary HTN.   Patient remains HDS and on room air. Patient denying CP or SOB. BNP normal. Echo LVEF 60 to 65%, increased RA pressure. No prior echocardiogram. Trace edema b/l lower extremities. Encouraged patient to sit up in chair, walk around room. Will  try to get recliner so he can sit up while eating and watching TV.  Plan:  - Monitor fluid status - Hold off diuresis  - Daily weights and I/O  RML nodule Spiculated appearing nodular opacity in the posterior right middle lobe abutting the major fissure measuring 1.9 x 1.3 cm seen on CT chest PE study. Ddx concerning for infectious vs inflammation vs malignancy. Patient does not carry a former tobacco use. Ordered CT abdomen/pelvis without masses, confirmed previously visualized nodules and lymphadenopathy. Dr. Delton Coombes planning bronchoscopy 7/26 ~3 PM.   Plan:  - NPO tonight - Hold DVT prophylaxis  - Clarify need for AF testing for TB to remove precautions - May need further imaging with PET or EBUS, perhaps during this admission given insurance status limiting outpatient follow-up.   Back pain Likely muscular in nature. Intermittent, mid to lower back. No tenderness on palpation, improves with position.  - Robaxin 500 mg q6H   Anxiety Will try hydroxyzine PRN for anxiety. EKG 7/22 with QT/QTcB 320/380. Zofran 4 mg 1x dose for nausea. Patient not interested in sleep aid at this time.  -  Monitor, try PRN hydroxyzine (not given/requested 7/23) - Repeat EKG if ongoing need for Zofran or antiemetic  Fat necrosis on CT Area of evolving fat necrosis overlying the distal sacrum right of the midline subcutaneously on CT abdomen/pelvis 7/22. No erythema, edema, or skin changes on lower back/sacrum or buttocks. Will monitor. Encouraged patient to sit up in chair throughout the day.     Elevated alkaline phosphatase - normalized No changes in liver enzymes. Alk phos 108 from 135, WNL. AST 21, ALT 37, WNL. Vitamin D levels 14.75 (low), vitamin D 1-25 81.6 (elevated). GGT 178. Isolated, elevated GGT could be caused by alcohol intake, poor/fatty diet, and contaminants. Most likely related to patient's eating habits, BMI 48.09 but will continue to monitor.   Plan: - F/u pending labs  - Will consider RUQ US/imaging if labs trend towards concern    HTN HLD Stable on admission. Will hold home Lisinopril in setting of AKI. -Monitor BP -Continue Lipitor 40 mg daily   Type 2 DM  Non insulin dependent. A1c 8.9 3 days ago. - Hold home medications - SSI severe   Diet: Carb-Modified with fluid restriction VTE: Enoxaparin Code: Full  Prior to Admission Living Arrangement: Home Anticipated Discharge Location: Home Barriers to Discharge: Medical management/work-up Dispo: Anticipated discharge in approximately 1-2 day(s).   Philomena Doheny, MD, PGY-1 12/01/2022, 7:46 AM Pager: [phone] After 5pm on weekdays and 1pm on weekends: On Call pager [phone]

## 2022-12-01 NOTE — TOC Progression Note (Signed)
Transition of Care [facility]) - Progression Note    Patient Details  Name: Roy Andrews MRN: [id number] Date of Birth: [birth year]  [facility]) CM/SW Contact  Tom-Johnson, Hershal Coria, RN Phone Number: 12/01/2022, 4:56 PM  Clinical Narrative:     Patient had CTA significant for Mediastinal and Hilar Lymphadenopathy, concerning for Malignancy or possible Sarcoidosis. PCCM consulted for further workup.   Quantiferon Gold negative, r/o TB.  Plan for Bronchoscopy tomorrow, 12/02/22.   CM will continue to follow as patient progresses with care towards discharge.        Expected Discharge Plan and Services                                               Social Determinants of Health (SDOH) Interventions SDOH Screenings   Food Insecurity: Food Insecurity Present (11/03/2021)  Housing: Low Risk  (11/03/2021)  Transportation Needs: No Transportation Needs (11/03/2021)  Depression (PHQ2-9): Low Risk  (10/31/2018)  Financial Resource Strain: Medium Risk (11/03/2021)  Tobacco Use: Low Risk  (11/26/2022)    Readmission Risk Interventions    11/28/2022    3:12 PM  Readmission Risk Prevention Plan  Post Dischage Appt Complete  Medication Screening Complete  Transportation Screening Complete

## 2022-12-02 ENCOUNTER — Inpatient Hospital Stay (HOSPITAL_COMMUNITY): Payer: Self-pay | Admitting: Critical Care Medicine

## 2022-12-02 ENCOUNTER — Encounter (HOSPITAL_COMMUNITY): Payer: Self-pay | Admitting: Internal Medicine

## 2022-12-02 ENCOUNTER — Encounter (HOSPITAL_COMMUNITY)
Admission: EM | Disposition: A | Discharge status: Home / Self Care | Payer: Self-pay | Source: Home / Self Care | Attending: Internal Medicine

## 2022-12-02 ENCOUNTER — Inpatient Hospital Stay (HOSPITAL_COMMUNITY): Payer: Self-pay

## 2022-12-02 ENCOUNTER — Telehealth: Payer: Self-pay | Admitting: Emergency Medicine

## 2022-12-02 DIAGNOSIS — E1165 Type 2 diabetes mellitus with hyperglycemia: Secondary | ICD-10-CM

## 2022-12-02 DIAGNOSIS — R911 Solitary pulmonary nodule: Principal | ICD-10-CM

## 2022-12-02 DIAGNOSIS — R918 Other nonspecific abnormal finding of lung field: Secondary | ICD-10-CM

## 2022-12-02 DIAGNOSIS — E785 Hyperlipidemia, unspecified: Secondary | ICD-10-CM

## 2022-12-02 DIAGNOSIS — I10 Essential (primary) hypertension: Secondary | ICD-10-CM

## 2022-12-02 DIAGNOSIS — Z7984 Long term (current) use of oral hypoglycemic drugs: Secondary | ICD-10-CM

## 2022-12-02 HISTORY — PX: VIDEO BRONCHOSCOPY WITH ENDOBRONCHIAL ULTRASOUND: SHX6177

## 2022-12-02 HISTORY — PX: BRONCHIAL WASHINGS: SHX5105

## 2022-12-02 HISTORY — PX: BRONCHIAL NEEDLE ASPIRATION BIOPSY: SHX5106

## 2022-12-02 HISTORY — PX: BRONCHIAL BRUSHINGS: SHX5108

## 2022-12-02 LAB — AEROBIC/ANAEROBIC CULTURE W GRAM STAIN (SURGICAL/DEEP WOUND): Gram Stain: NONE SEEN

## 2022-12-02 LAB — RENAL FUNCTION PANEL
Albumin: 3.3 g/dL — ABNORMAL LOW (ref 3.5–5.0)
Anion gap: 8 (ref 5–15)
BUN: 33 mg/dL — ABNORMAL HIGH (ref 6–20)
CO2: 25 mmol/L (ref 22–32)
Calcium: 9.7 mg/dL (ref 8.9–10.3)
Chloride: 97 mmol/L — ABNORMAL LOW (ref 98–111)
Creatinine, Ser: 1.61 mg/dL — ABNORMAL HIGH (ref 0.61–1.24)
GFR, Estimated: 52 mL/min — ABNORMAL LOW (ref >60)
Glucose, Bld: 134 mg/dL — ABNORMAL HIGH (ref 70–99)
Phosphorus: 4.1 mg/dL (ref 2.5–4.6)
Potassium: 4.3 mmol/L (ref 3.5–5.1)
Sodium: 130 mmol/L — ABNORMAL LOW (ref 135–145)

## 2022-12-02 LAB — GLUCOSE, CAPILLARY
Glucose-Capillary: 141 mg/dL — ABNORMAL HIGH (ref 70–99)
Glucose-Capillary: 119 mg/dL — ABNORMAL HIGH (ref 70–99)
Glucose-Capillary: 155 mg/dL — ABNORMAL HIGH (ref 70–99)
Glucose-Capillary: 199 mg/dL — ABNORMAL HIGH (ref 70–99)

## 2022-12-02 LAB — CULTURE, BAL-QUANTITATIVE W GRAM STAIN: Gram Stain: NONE SEEN

## 2022-12-02 SURGERY — BRONCHOSCOPY, WITH BIOPSY USING ELECTROMAGNETIC NAVIGATION
Anesthesia: General

## 2022-12-02 MED ORDER — PROPOFOL 500 MG/50ML IV EMUL
INTRAVENOUS | Status: DC | PRN
  Administered 2022-12-02: 125 ug/kg/min via INTRAVENOUS
  Administered 2022-12-02: 25 mg via INTRAVENOUS

## 2022-12-02 MED ORDER — FENTANYL CITRATE (PF) 100 MCG/2ML IJ SOLN
INTRAMUSCULAR | Status: AC
  Filled 2022-12-02: qty 2

## 2022-12-02 MED ORDER — PROPOFOL 10 MG/ML IV BOLUS
INTRAVENOUS | Status: DC | PRN
Start: 2022-12-02 — End: 2022-12-02
  Administered 2022-12-02: 200 mg via INTRAVENOUS

## 2022-12-02 MED ORDER — ENOXAPARIN SODIUM 80 MG/0.8ML IJ SOSY
80.0000 mg | PREFILLED_SYRINGE | INTRAMUSCULAR | Status: DC
  Administered 2022-12-02: 80 mg via SUBCUTANEOUS
  Filled 2022-12-02 (×2): qty 0.8

## 2022-12-02 MED ORDER — ROCURONIUM BROMIDE 10 MG/ML (PF) SYRINGE
PREFILLED_SYRINGE | INTRAVENOUS | Status: DC | PRN
  Administered 2022-12-02: 60 mg via INTRAVENOUS
  Administered 2022-12-02: 20 mg via INTRAVENOUS

## 2022-12-02 MED ORDER — FENTANYL CITRATE (PF) 100 MCG/2ML IJ SOLN
INTRAMUSCULAR | Status: DC | PRN
  Administered 2022-12-02: 100 ug via INTRAVENOUS

## 2022-12-02 MED ORDER — DEXAMETHASONE SODIUM PHOSPHATE 10 MG/ML IJ SOLN
INTRAMUSCULAR | Status: DC | PRN
  Administered 2022-12-02: 4 mg via INTRAVENOUS

## 2022-12-02 MED ORDER — MIDAZOLAM HCL (PF) 5 MG/ML IJ SOLN
INTRAMUSCULAR | Status: DC | PRN
  Administered 2022-12-02: 1 mg via INTRAVENOUS

## 2022-12-02 MED ORDER — MIDAZOLAM HCL (PF) 5 MG/ML IJ SOLN
INTRAMUSCULAR | Status: AC
  Filled 2022-12-02: qty 1

## 2022-12-02 MED ORDER — PHENYLEPHRINE HCL-NACL 20-0.9 MG/250ML-% IV SOLN
INTRAVENOUS | Status: DC | PRN
  Administered 2022-12-02: 25 ug/min via INTRAVENOUS

## 2022-12-02 MED ORDER — LACTATED RINGERS IV SOLN: INTRAVENOUS | Status: DC | PRN

## 2022-12-02 MED ORDER — ONDANSETRON HCL 4 MG/2ML IJ SOLN
INTRAMUSCULAR | Status: DC | PRN
  Administered 2022-12-02: 4 mg via INTRAVENOUS

## 2022-12-02 MED ORDER — LIDOCAINE 2% (20 MG/ML) 5 ML SYRINGE
INTRAMUSCULAR | Status: DC | PRN
  Administered 2022-12-02: 20 mg via INTRAVENOUS

## 2022-12-02 MED ORDER — SUGAMMADEX SODIUM 200 MG/2ML IV SOLN
INTRAVENOUS | Status: DC | PRN
  Administered 2022-12-02: 400 mg via INTRAVENOUS

## 2022-12-02 NOTE — Anesthesia Preprocedure Evaluation (Addendum)
Anesthesia Evaluation  Patient identified by MRN, date of birth, ID band Patient awake    Reviewed: Allergy & Precautions, NPO status , Patient's Chart, lab work & pertinent test results, reviewed documented beta blocker date and time   History of Anesthesia Complications Negative for: history of anesthetic complications  Airway Mallampati: III  TM Distance: >3 FB Neck ROM: Full    Dental  (+) Dental Advisory Given   Pulmonary sleep apnea (has been using CPAP and O2 irregularly)  CT chest findings of mediastinal and bilateral LN enlargement concerning for sarcoidosis, lymphoma, metastatic disease   breath sounds clear to auscultation       Cardiovascular hypertension, Pt. on medications and Pt. on home beta blockers (-) angina  Rhythm:Regular Rate:Normal  11/26/2022 ECHO: EF 60-65%, normal LVF, normal RVF, trivial MR, no AS   Neuro/Psych negative neurological ROS     GI/Hepatic negative GI ROS, Neg liver ROS,,,  Endo/Other  diabetes (glu 119), Oral Hypoglycemic Agents  Morbid obesityBMI 46.2  Renal/GU Renal InsufficiencyRenal disease     Musculoskeletal   Abdominal  (+) + obese  Peds  Hematology negative hematology ROS (+)   Anesthesia Other Findings   Reproductive/Obstetrics                             Anesthesia Physical Anesthesia Plan  ASA: 3  Anesthesia Plan: General   Post-op Pain Management: Tylenol PO (pre-op)*   Induction: Intravenous  PONV Risk Score and Plan: 2 and Ondansetron and Dexamethasone  Airway Management Planned: Oral ETT  Additional Equipment: None  Intra-op Plan:   Post-operative Plan: Extubation in OR  Informed Consent: I have reviewed the patients History and Physical, chart, labs and discussed the procedure including the risks, benefits and alternatives for the proposed anesthesia with the patient or authorized representative who has indicated his/her  understanding and acceptance.     Dental advisory given  Plan Discussed with: Surgeon and CRNA  Anesthesia Plan Comments: (Discussed in Spanish)        Anesthesia Quick Evaluation

## 2022-12-02 NOTE — Anesthesia Procedure Notes (Signed)
Procedure Name: Intubation Date/Time: 12/02/2022 1:30 PM  Performed by: Waynard Edwards, CRNAPre-anesthesia Checklist: Patient identified, Emergency Drugs available, Suction available and Patient being monitored Patient Re-evaluated:Patient Re-evaluated prior to induction Oxygen Delivery Method: Circle system utilized Preoxygenation: Pre-oxygenation with 100% oxygen Induction Type: IV induction Ventilation: Two handed mask ventilation required and Mask ventilation without difficulty Laryngoscope Size: Mac and 4 Grade View: Grade II Tube type: Oral Tube size: 8.5 mm Number of attempts: 1 Airway Equipment and Method: Stylet Placement Confirmation: ETT inserted through vocal cords under direct vision, positive ETCO2 and breath sounds checked- equal and bilateral Secured at: 25 cm Tube secured with: Tape Dental Injury: Teeth and Oropharynx as per pre-operative assessment

## 2022-12-02 NOTE — Anesthesia Postprocedure Evaluation (Signed)
Anesthesia Post Note  Patient: Roy Andrews  Procedure(s) Performed: ROBOTIC ASSISTED NAVIGATIONAL BRONCHOSCOPY VIDEO BRONCHOSCOPY WITH ENDOBRONCHIAL ULTRASOUND BRONCHIAL BRUSHINGS BRONCHIAL WASHINGS BRONCHIAL NEEDLE ASPIRATION BIOPSIES     Patient location during evaluation: Endoscopy Anesthesia Type: General Level of consciousness: awake and alert, patient cooperative and oriented Pain management: pain level controlled Vital Signs Assessment: post-procedure vital signs reviewed and stable Respiratory status: spontaneous breathing, nonlabored ventilation, respiratory function stable and patient connected to face mask oxygen Cardiovascular status: blood pressure returned to baseline and stable Postop Assessment: no apparent nausea or vomiting Anesthetic complications: no   No notable events documented.  Last Vitals:  Vitals:   12/02/22 1506 12/02/22 1507  BP:    Pulse: 93 93  Resp:    Temp:    SpO2: 95% 96%    Last Pain:  Vitals:   12/02/22 1455  TempSrc: Temporal  PainSc: 0-No pain                 Gerlean Cid,E. Jacob Chamblee

## 2022-12-02 NOTE — Plan of Care (Signed)
  Problem: Education: Goal: Knowledge of General Education information will improve Description Including pain rating scale, medication(s)/side effects and non-pharmacologic comfort measures Outcome: Progressing   Problem: Health Behavior/Discharge Planning: Goal: Ability to manage health-related needs will improve Outcome: Progressing   

## 2022-12-02 NOTE — Interval H&P Note (Signed)
History and Physical Interval Note:  12/02/2022 1:04 PM  [facility] Roy Andrews  has presented today for surgery, with the diagnosis of abnormal CT.  The various methods of treatment have been discussed with the patient and family. After consideration of risks, benefits and other options for treatment, the patient has consented to  Procedure(s): ROBOTIC ASSISTED NAVIGATIONAL BRONCHOSCOPY (N/A) VIDEO BRONCHOSCOPY WITH ENDOBRONCHIAL ULTRASOUND (N/A) as a surgical intervention.  The patient's history has been reviewed, patient examined, no change in status, stable for surgery.  I have reviewed the patient's chart and labs.  Questions were answered to the patient's satisfaction.     Leslye Peer

## 2022-12-02 NOTE — Transfer of Care (Signed)
Immediate Anesthesia Transfer of Care Note  Patient: Roy Andrews  Procedure(s) Performed: ROBOTIC ASSISTED NAVIGATIONAL BRONCHOSCOPY VIDEO BRONCHOSCOPY WITH ENDOBRONCHIAL ULTRASOUND BRONCHIAL BRUSHINGS BRONCHIAL WASHINGS BRONCHIAL NEEDLE ASPIRATION BIOPSIES  Patient Location: Endoscopy Unit  Anesthesia Type:General  Level of Consciousness: drowsy and patient cooperative  Airway & Oxygen Therapy: Patient Spontanous Breathing and Patient connected to face mask oxygen  Post-op Assessment: Report given to RN and Post -op Vital signs reviewed and stable  Post vital signs: Reviewed and stable  Last Vitals:  Vitals Value Taken Time  BP 101/58  71 1456 12/02/22  Temp    Pulse 92 1455 12/02/22  Resp 14 1455 12/02/22  SpO2 95 1455 12/02/22    Last Pain:  Vitals:   12/02/22 1313  TempSrc: Temporal  PainSc: 0-No pain         Complications: No notable events documented.

## 2022-12-02 NOTE — Telephone Encounter (Signed)
Patient needs a hospital follow up visit in about 1 month, any provider, thank you

## 2022-12-02 NOTE — Progress Notes (Signed)
Summary: Orvilla Fus Bess Kinds is a 52 yo Spanish speaking person living with significant medical history of uncontrolled DM, HTN, HLD, Obesity, OSA not currently using cPAPprior COVID infection requiring ICU admission and 4 months of supplemental O2 requirement that was sent to the ED after evaluation at his PCP revealed an abnormal HS troponin after evaluation for dyspnea on exertion and bilateral midback pain.   Subjective:  No overnight events reported. NPO since midnight 7/25. Blood pressures continue to fluctuate at night; patient has OSA and is not able to tolerate CPAP.   Denies CP, SOB, N/V, back pain, headache, or dysuria this morning. No complaints, feels at calm. Sounds congested while speaking but denies sneezing, coughing, or nasal drainage. Patient's wife will try to stop by before bronchoscopy later today.   Expressed some concern regarding his ability to afford follow-up appointments should anything be found on bronchoscopy and require treatment. Will engage SW or TOC for assistance closer to discharge as patient does not have medical insurance.   Objective:  Vital signs in last 24 hours: Vitals:   12/01/22 1643 12/01/22 2110 12/02/22 0518 12/02/22 0905  BP: 92/69 (!) 101/52 106/66 106/62  Pulse: 88 87 86 79  Resp: 19 18 18 18   Temp: 98.9 F (37.2 C) 98.2 F (36.8 C) 97.6 F (36.4 C) 98 F (36.7 C)  TempSrc:  Oral Oral   SpO2: 98% 96% 94% 94%  Weight:   (!) 150.4 kg   Height:          Latest Ref Rng & Units 11/29/2022    2:12 AM 11/28/2022    2:38 AM 11/27/2022    7:45 AM  CBC  WBC 4.0 - 10.5 K/uL 6.6  6.6  5.7   Hemoglobin 13.0 - 17.0 g/dL 74.2  59.5  63.8   Hematocrit 39.0 - 52.0 % 39.9  40.1  40.3   Platelets 150 - 400 K/uL 193  181  175        Latest Ref Rng & Units 12/02/2022    7:02 AM 12/01/2022    2:23 AM 11/30/2022    4:08 AM  CMP  Glucose 70 - 99 mg/dL 756  433  295   BUN 6 - 20 mg/dL 33  33  31   Creatinine 0.61 - 1.24 mg/dL 1.88  4.16   6.06   Sodium 135 - 145 mmol/L 130  130  133   Potassium 3.5 - 5.1 mmol/L 4.3  4.1  4.2   Chloride 98 - 111 mmol/L 97  95  95   CO2 22 - 32 mmol/L 25  23  25    Calcium 8.9 - 10.3 mg/dL 9.7  9.5  9.9    CBG 30:16 AM 119  Physical Exam Constitutional: Upper airway congestion, conversing appropriately; in no acute respiratory distress.  CV: RRR, no rubs, murmurs, or gallops. No edema on exam.  Pulmonary: Clear lungs bilaterally, normal respiratory effort.  Abdominal: Soft, non-tender, non-distended. No suprapubic tenderness.  Skin: Warm and dry.  Psych: Normal mood and affect.   Assessment/Plan:  Principal Problem:   AKI (acute kidney injury) (HCC) Active Problems:   Primary hypertension   Type 2 diabetes mellitus with hyperglycemia, without long-term current use of insulin (HCC)   Class 3 severe obesity due to excess calories with serious comorbidity and body mass index (BMI) of 45.0 to 49.9 in adult [facility])   Dyslipidemia associated with type 2 diabetes mellitus (HCC)   Diabetic peripheral neuropathy associated with type  2 diabetes mellitus (HCC)   Hyperkalemia   Mediastinal lymphadenopathy   OSA (obstructive sleep apnea)   Hypercalcemia   Elevated alkaline phosphatase level  Patient is a 52 yo Spanish speaking person living with significant medical history of uncontrolled DM, HTN, HLD, Obesity, OSA who presented with abnormal labs, worsening dyspnea, chest pain, and 18 lb unintentional weight loss. He was admitted for AKI, which allowed for further workup of abnormal labs and symptoms. ACS ruled out in the ED.   Mediastinal & Hilar LAD, concerning for lymphoma vs. Metastatic nodal disease vs. sarcoid Hypercalcemia  Dyspnea on exertion OSA not on cPAP Patient with progressive worsening in dyspnea over the past month and +ROS for B symptoms. Now with CT chest findings of mediastinal and bilateral LN enlargement concerning for sarcoidosis, lymphoma, metastatic disease, which could  be contributing to worsening dyspnea. TB less likely given negative Quantiferon TB Gold  test resulted 7/23 (patient has been in the [place] for the past 21 years, emigrated from [place]). With recent history of worsening renal function, will assess for glomerulonephritis as well. No history of prior colonoscopy.   Review of his labs one year ago without sign of hypercalcemia, present on repeat studies on admission. Corrected calcium 7/22 10.4  7/23 10.2  7/24 10.3.  7/25 10.0  7/26 10.3.   PCCM consulted in the ED, Ddx infectious vs  inflammatory vs malignancy. Bronchoscopy with Dr. Delton Coombes today.    Plan:  - Re-establish diet after bronchoscopy - Restart DVT prophylaxis after bronchoscopy - F/u pulm recommendations  - F/u bronch tissue biopsy - F/u BAL results - Monitor respiratory status   AKI Hyperkalemia- resolved Hyponatremia  Patient with longstanding hypertension with intermittent adherence to outpatient medications for blood pressure and diabetes. Patient recently started on Jardiance and recently taking all prescribed medications, some renally cleared, at around the same time (~3 weeks ago). Patient received intravenous contrast for imaging as well. Renal ultrasound 7/20 showing no mass or hydronephrosis, echogenicity within normal limits, no mass or hydronephrosis visualized. Only recently started treating diabetes more consistently, A1C 4/22 13.4, A1C 7/17 8.9.    Creatinine 0.95 about a year ago. Cr. 1.58 on admission 7/21. Received oral and IV contrast for CT abd/pelvis 7/23. Today Cr. Improved to 1.61.   Potassium 4.3, stable.   Sodium initially trending down 134 > 132, > 133 > 130 > 133 > 130. Today sodium 130, stable. Patient's UA hazy, rare bacteria, glucosuria, and hyaline casts present. Patient denies dysuria, post-void bladder scan 0 ccs 7/25.    Plan:  - Monitor labs and replete as necessary  - Hold nephrotoxic and renally cleared medications   Dyspnea on  exertion Atypical chest pain OSA not on cPA Pulmonary HTN Differential diagnosis above potentially contributing to DOE. However, patient with acute worsening in DOE and LE edema also concerning for cardiac etiologies such as acute heart failure. OSA is untreated with CTA with findings suggestive of pulmonary HTN.   Patient remains HDS and on room air. Patient denying CP or SOB. BNP normal. Echo LVEF 60 to 65%, increased RA pressure. No prior echocardiogram. Trace edema b/l lower extremities. Encouraged patient to sit up in chair, walk around room. Will try to get recliner so he can sit up while eating and watching TV.  Plan:  - Monitor fluid status - Hold off diuresis  - Daily weights and I/O  RML nodule Spiculated appearing nodular opacity in the posterior right middle lobe abutting the major fissure measuring 1.9  x 1.3 cm seen on CT chest PE study. Ddx concerning for infectious vs inflammation vs malignancy. Patient does not carry a former tobacco use. Ordered CT abdomen/pelvis without masses, confirmed previously visualized nodules and lymphadenopathy. Dr. Delton Coombes planning bronchoscopy 7/26 ~3 PM.   Plan:  - Resume DVT prophylaxis after bronschoscopy - Clarify need for TB precautions post BAL - May need further imaging with PET or EBUS, perhaps during this admission given insurance status limiting outpatient follow-up.   Back pain Likely muscular in nature. Intermittent, mid to lower back. No tenderness on palpation, improves with position.  - Continue Robaxin 500 mg q6H, has been effective  Anxiety Will try hydroxyzine PRN for anxiety. EKG 7/22 with QT/QTcB 320/380. Zofran 4 mg 1x dose for nausea. Patient not interested in sleep aid at this time.  - Monitor, try PRN hydroxyzine (not given/requested 7/23) - Repeat EKG if ongoing need for Zofran or antiemetic  Fat necrosis on CT Area of evolving fat necrosis overlying the distal sacrum right of the midline subcutaneously on CT  abdomen/pelvis 7/22. No erythema, edema, or skin changes on lower back/sacrum or buttocks. Will monitor. Encouraged patient to sit up in chair, move around room throughout the day.     Elevated alkaline phosphatase - normalized No changes in liver enzymes. Alk phos 108 from 135, WNL. AST 21, ALT 37, WNL. Vitamin D levels 14.75 (low), vitamin D 1-25 81.6 (elevated). GGT 178. Isolated, elevated GGT could be caused by alcohol intake, poor/fatty diet, and contaminants. Most likely related to patient's eating habits, BMI 48.09 but will continue to monitor.   Plan: - F/u pending labs  - Will consider RUQ US/imaging if labs trend towards concern.   HTN HLD Stable on admission. Will hold home Lisinopril in setting of AKI. -Monitor BP -Continue Lipitor 40 mg daily   Type 2 DM  Non insulin dependent. A1c 8.9 3 days ago. - Hold home medications - SSI severe   Diet: NPO midnight 7/25 for bronchoscopy 7/26 VTE: Enoxaparin- held for bronchoscopy  Code: Full  Prior to Admission Living Arrangement: Home Anticipated Discharge Location: Home Barriers to Discharge: Medical management/work-up Dispo: Anticipated discharge in approximately 1-2 day(s).   Philomena Doheny, MD, PGY-1 12/02/2022, 12:35 PM Pager: [phone] After 5pm on weekdays and 1pm on weekends: On Call pager [phone]

## 2022-12-02 NOTE — TOC Progression Note (Signed)
Transition of Care [facility]) - Progression Note    Patient Details  Name: Roy Andrews MRN: [id number] Date of Birth: Dec 04, 1970  Transition of [facility]) CM/SW Contact  Tom-Johnson, Hershal Coria, RN Phone Number: 12/02/2022, 4:18 PM  Clinical Narrative:     Patient with no Medical Insurance and undocumented, referred to Physiological scientist for Financial eligibility. MATCH will be done for prescription assistance at discharge.  CM will continue to follow as patient progresses with care towards discharge.       Expected Discharge Plan and Services                                               Social Determinants of Health (SDOH) Interventions SDOH Screenings   Food Insecurity: Food Insecurity Present (11/03/2021)  Housing: Low Risk  (11/03/2021)  Transportation Needs: No Transportation Needs (11/03/2021)  Depression (PHQ2-9): Low Risk  (10/31/2018)  Financial Resource Strain: Medium Risk (11/03/2021)  Tobacco Use: Low Risk  (12/02/2022)    Readmission Risk Interventions    11/28/2022    3:12 PM  Readmission Risk Prevention Plan  Post Dischage Appt Complete  Medication Screening Complete  Transportation Screening Complete

## 2022-12-02 NOTE — Op Note (Signed)
Video Bronchoscopy with Robotic Assisted Bronchoscopic Navigation and Endobronchial Ultrasound Procedure Note  Date of Operation: 12/02/2022   Pre-op Diagnosis: Right middle lobe nodule, mediastinal adenopathy  Post-op Diagnosis: Same  Surgeon: Levy Pupa  Assistants: None  Anesthesia: General endotracheal anesthesia  Operation: Flexible video fiberoptic bronchoscopy with robotic assistance and biopsies.  Estimated Blood Loss: Minimal  Complications: None  Indications and History: Roy Andrews is a 52 y.o. male with history of mediastinal adenopathy and right middle lobe mixed density nodule on CT chest.  Recommendation made to achieve a tissue diagnosis and culture data via robotic assisted navigational bronchoscopy and endobronchial ultrasound with biopsies. The risks, benefits, complications, treatment options and expected outcomes were discussed with the patient.  The possibilities of pneumothorax, pneumonia, reaction to medication, pulmonary aspiration, perforation of a viscus, bleeding, failure to diagnose a condition and creating a complication requiring transfusion or operation were discussed with the patient who freely signed the consent.    Description of Procedure: The patient was seen in the Preoperative Area, was examined and was deemed appropriate to proceed.  The patient was taken to [facility] endoscopy room 3, identified as [facility] and the procedure verified as Flexible Video Fiberoptic Bronchoscopy.  A Time Out was held and the above information confirmed.   Prior to the date of the procedure a high-resolution CT scan of the chest was performed. Utilizing ION software program a virtual tracheobronchial tree was generated to allow the creation of distinct navigation pathways to the patient's parenchymal abnormalities. After being taken to the operating room general anesthesia was initiated and the patient  was orally intubated. The video  fiberoptic bronchoscope was introduced via the endotracheal tube and a general inspection was performed which showed normal right and left lung anatomy. Aspiration of the bilateral mainstems was completed to remove any remaining secretions. Robotic catheter inserted into patient's endotracheal tube.   Target #1 right middle lobe pulmonary nodule: The distinct navigation pathway prepared prior to this procedure was then utilized to navigate to patient's lesion identified on CT scan. The robotic catheter was secured into place and the vision probe was withdrawn.  Lesion location was approximated using fluoroscopy Cios three-dimensional imaging.  On cytospin the right middle lobe nodule adjacent to the major fissure was poorly visualized and appeared to have largely resolved.  Under fluoroscopic guidance transbronchial brushings were performed in this region to be sent for cytology.  A bronchioalveolar lavage was performed in the right middle lobe and sent for microbiology.  The robotic scope was then withdrawn and the endobronchial ultrasound was used to identify and characterize the peritracheal, hilar and bronchial lymph nodes. Inspection showed widespread nodal enlargement in the precarinal region, subcarinal region and down the right mainstem bronchus. Using real-time ultrasound guidance Wang needle biopsies were take from Station 4R, 7, 11 R nodes and were sent for cytology.  A single sample from Station 7 was sent for fungal and AFB smear and culture.  At the end of the procedure a general airway inspection was performed and there was no evidence of active bleeding. The bronchoscope was removed.  The patient tolerated the procedure well. There was no significant blood loss and there were no obvious complications. A post-procedural chest x-ray is pending.  Samples Target #1: 1. Transbronchial brushings from right middle lobe nodule (which was largely resolved on imaging) 2. Bronchoalveolar lavage from  right middle lobe  Samples: 1. Wang needle biopsies from 4R node 2. Wang needle biopsies from 7  node 3. Wang needle biopsies from 11 R node  Plans:  The patient will be transferred to his hospital room from the PACU when recovered from anesthesia and after chest x-ray is reviewed. We will review the cytology, pathology and microbiology results with the patient when they become available.   Levy Pupa, MD, PhD 12/02/2022, 2:56 PM [facility] [phone] or if no answer before 7:00PM call [phone] For any issues after 7:00PM please call eLink [phone]

## 2022-12-03 DIAGNOSIS — R590 Localized enlarged lymph nodes: Secondary | ICD-10-CM

## 2022-12-03 LAB — GLUCOSE, CAPILLARY
Glucose-Capillary: 118 mg/dL — ABNORMAL HIGH (ref 70–99)
Glucose-Capillary: 114 mg/dL — ABNORMAL HIGH (ref 70–99)

## 2022-12-03 MED ORDER — METHOCARBAMOL 500 MG PO TABS: 500.0000 mg | ORAL_TABLET | Freq: Four times a day (QID) | ORAL | 0 refills | Status: DC | PRN

## 2022-12-03 MED ORDER — ATORVASTATIN CALCIUM 40 MG PO TABS: 40.0000 mg | ORAL_TABLET | Freq: Every day | ORAL | 0 refills | Status: DC

## 2022-12-03 NOTE — Discharge Summary (Signed)
Name: Roy Andrews MRN: [id number] DOB: July 03, 1970 52 y.o. PCP: Julieanne Manson, MD  Date of Admission: 11/26/2022 11:21 AM Date of Discharge: 12/03/2022 Attending Physician: Mercie Eon, MD  Discharge Diagnosis: 1. Principal Problem:   AKI (acute kidney injury) (HCC) Active Problems:   Primary hypertension   Type 2 diabetes mellitus with hyperglycemia, without long-term current use of insulin (HCC)   Class 3 severe obesity due to excess calories with serious comorbidity and body mass index (BMI) of 45.0 to 49.9 in adult [facility])   Dyslipidemia associated with type 2 diabetes mellitus (HCC)   Diabetic peripheral neuropathy associated with type 2 diabetes mellitus (HCC)   Hyperkalemia   Mediastinal lymphadenopathy   OSA (obstructive sleep apnea)   Hypercalcemia   Elevated alkaline phosphatase level   Pulmonary nodule    Discharge Medications: Allergies as of 12/03/2022   No Known Allergies      Medication List     STOP taking these medications    carvedilol 3.125 MG tablet Commonly known as: COREG   empagliflozin 10 MG Tabs tablet Commonly known as: Jardiance   Fish Oil 1000 MG Caps   glipiZIDE 10 MG tablet Commonly known as: GLUCOTROL   ibuprofen 600 MG tablet Commonly known as: ADVIL   lisinopril 20 MG tablet Commonly known as: ZESTRIL   loratadine 10 MG tablet Commonly known as: CLARITIN   metFORMIN 500 MG 24 hr tablet Commonly known as: GLUCOPHAGE-XR   nitroGLYCERIN 0.4 MG SL tablet Commonly known as: NITROSTAT       TAKE these medications    AgaMatrix Presto Test test strip Generic drug: glucose blood Check blood glucose twice daily before meals.   AgaMatrix Presto w/Device Kit Check blood glucose twice daily before meals   AgaMatrix Ultra-Thin Lancets Misc Check blood glucose twice daily before meals   aspirin EC 81 MG tablet Take 1 tablet (81 mg total) by mouth daily. Swallow whole.   atorvastatin 40 MG  tablet Commonly known as: LIPITOR Take 1 tablet (40 mg total) by mouth daily. Start taking on: December 04, 2022 What changed:  how much to take how to take this when to take this additional instructions Another medication with the same name was removed. Continue taking this medication, and follow the directions you see here.   blood glucose meter kit and supplies Dispense based on patient and insurance preference. Use up to four times daily as directed. (FOR ICD-10 E10.9, E11.9).   gabapentin 100 MG capsule Commonly known as: NEURONTIN 3 caps by mouth 3 times daily   methocarbamol 500 MG tablet Commonly known as: ROBAXIN Take 1 tablet (500 mg total) by mouth every 6 (six) hours as needed for muscle spasms.   tirzepatide 2.5 MG/0.5ML Pen Commonly known as: MOUNJARO Inject 2.5 mg into the skin once a week.        Disposition and follow-up:   Mr.Roy Andrews was discharged from [facility] in Good condition.  At the hospital follow up visit please address:  1.    AKI - Please recheck BMP at follow up visit for resolution. Could be new baseline for him in the setting of uncontrolled HTN and DM2  T2DM - Holding metformin given kidney injury, please ensure he is on therapy  HTN - Pt has been normotensive in the hospital, holding lisinopril on discharge   Mediastinal LAD - Please ensure pt attends appointment with pulmonology on 9/4, and follow up his lab results.   2.  Labs / imaging needed at time of follow-up: BMP  3.  Pending labs/ test needing follow-up: AFB, Non PAP cytology, Fungus culture,   Follow-up Appointments:   Hospital Course by problem list: AKI with hyperkalemia BUN/Cr >20 pre-renal w/ intrarenal and postobstructive causes contributing. Recently started on Jardiance. S/p Lokelma, EKG and telemetry stable. Improving. Patient with longstanding hypertension with intermittent adherence to outpatient medications for blood pressure  and diabetes. Patient recently started on Jardiance and recently taking all prescribed medications, some renally cleared, at around the same time (~3 weeks ago). Patient received intravenous contrast for imaging as well. Renal ultrasound 7/20 showing no mass or hydronephrosis, echogenicity within normal limits, no mass or hydronephrosis visualized. Only recently started treating diabetes more consistently, A1C 4/22 13.4, A1C 7/17 8.9.  Holding nephrotoxic medications such as metformin, lisinopril on discharge. Will need follow up in the outpatient setting. Could be patients new baseline in the setting of uncontrolled HTN and T2DM.   Mediastinal LAD, hypercalcemia 1 mo hx worsening dyspnea; now with CT showing mediastinal and bilateral LN enlargement c/f sarcoidosis, lymphoma, metastatic disease. Ddx TB (from [place]), glomerulonephritis, sarcoidosis, inflammatory etiologies. PULM/CRIT spoke with patient 7/22 to discuss lung biopsy, they will discuss and arrange, s/p bronchoscopy. Currently pending results. Pt has follow up with pulmonology on 01/11/23.   Acute worsening DOE TTE wnl. OSA not on CPAP, Pulm HTN potentially contributing. Currently no CP or discomfort. Will need CPAP.   RML nodule Spiculated nodular opacity in P R middle lobe 1.9 x 1.3 cm seen on CT chest PE study. Ddx concerning for infectious vs inflammation vs malignancy. PCCM consulted, follow reccs. Ordered CT abdomen/pelvis 7/22- uncomplicated diverticulosis, fat necrosis distal sacrum right of the midline, no primary or metastatic neoplasm  Anxiety Hydroxyzine PRN, one time Zofran, consider short course ambien for sleep.  Muscle spasms/back pain Treated with robaxin, will continue in the outpatient setting.   HTN Stable on admission; will hold home Lisinopril in setting of AKI; continue Lipitor, monitor BP.  Discharge Subjective:  Pt was seen bedside this AM. States he is feeling well, and answered his questions about his follow  up appointment and wearing a mask around new people.   Discharge Exam:   BP 104/71 (BP Location: Left Arm)   Pulse 80   Temp 98.2 F (36.8 C)   Resp 17   Ht 5\' 11"  (1.803 m)   Wt (!) 150.4 kg   SpO2 98%   BMI 46.24 kg/m  Discharge exam:   Constitutional: Well appearing, obese male, in no acute distress  CV: RRR, no rubs, murmurs, or gallops. NO edema noted  Pulmonary: Lungs clear to auscultation bilaterally  Abdominal: Soft, non-tender, non-distended.   Pertinent Labs, Studies, and Procedures:     Latest Ref Rng & Units 12/03/2022    3:14 AM 11/29/2022    2:12 AM 11/28/2022    2:38 AM  CBC  WBC 4.0 - 10.5 K/uL 6.5  6.6  6.6   Hemoglobin 13.0 - 17.0 g/dL 16.1  09.6  04.5   Hematocrit 39.0 - 52.0 % 39.2  39.9  40.1   Platelets 150 - 400 K/uL 192  193  181        Latest Ref Rng & Units 12/03/2022    3:14 AM 12/02/2022    7:02 AM 12/01/2022    2:23 AM  BMP  Glucose 70 - 99 mg/dL 409  811  914   BUN 6 - 20 mg/dL 34  33  33  Creatinine 0.61 - 1.24 mg/dL 2.13  0.86  5.78   Sodium 135 - 145 mmol/L 129  130  130   Potassium 3.5 - 5.1 mmol/L 4.7  4.3  4.1   Chloride 98 - 111 mmol/L 97  97  95   CO2 22 - 32 mmol/L 21  25  23    Calcium 8.9 - 10.3 mg/dL 9.7  9.7  9.5      Discharge Instructions: Discharge Instructions     Call MD for:  persistant nausea and vomiting   Complete by: As directed    Call MD for:  temperature >100.4   Complete by: As directed    Diet - low sodium heart healthy   Complete by: As directed    Increase activity slowly   Complete by: As directed        Signed: Olegario Messier, MD 12/03/2022, 11:17 AM   Pager: [phone]

## 2022-12-03 NOTE — Progress Notes (Signed)
DISCHARGE NOTE HOME [facility] Roy Andrews to be discharged Home per MD order. Diagnosis, treatment, follow-up appointments,and medications discussed in detail. Patient verbalized knowledge and understanding with teach back in relation to diagnosis, treatment, follow up and medications list. Skin clean, dry and intact without evidence of skin break down, no evidence of skin tears noted. IV catheter discontinued intact. Site without signs and symptoms of complications. Dressing and pressure applied. Pt denies pain at the site currently. No complaints noted. VSS.  Patient free of lines, drains, and wounds.   An After Visit Summary (AVS) was printed and given to the patient. Patient escorted via wheelchair, and discharged home via private auto.  Tresa Endo, RN

## 2022-12-03 NOTE — Discharge Instructions (Addendum)
You were seen for acute kidney injury and hypercalcemia on labs, and found to have mediastinal and hilar lymphadenopathy and a right medial lung nodule requiring further workup by a lung specialist.   - Dr. Levy Pupa, a pulmonologist with [facility] at [facility], saw you while you were in the hospital and ordered special labs and performed a bronchoscopy. You will be notified when the results of these labs and bronchoscopy with biopsies are ready. His clinic phone number is [phone], and the address for his office is [address], [place] [address].   - Dr. Kavin Leech office will call you to schedule an appointment at his clinic. You can call his office at [phone] if you have not heard from them by next Wednesday, July 31.   - You need to make an appointment with your regular doctor, Dr. Julieanne Manson, in the next 1-2 weeks. She can go over the results of the bronchoscopy with you and can follow up on any necessary labs. Her clinic's phone number is [phone].   - We have let our care team know that you will need some resources and they have passed on your information to a Physiological scientist. We let them know you prefer Spanish, and they will call you to help figure out hospital bills. You can also ask your doctor, Dr. Delrae Alfred, for a medication assistance program IF you end up requiring any new medical treatments.  - Your blood pressures have been good, 114/66, 106/62, 110/65. This may be due to your diet being different (less salt, less fat) in the hospital. Please check your BP at your next doctor visit and discuss the need for continued medications.   - We added a new medication, "Methocarbamol/Robaxin" for your back muscle spasms. You can take one pill every 6 hours as needed.   - Your fasting blood sugars have been 163, 140, 120, 128, 111, 169, and 119. Your random blood sugars have been 127, 96, 102, 123, 100, and 134. You have been  getting insulin units (3-9). Please follow up your diabetes medications with Dr. Delrae Alfred at your next appointment.   - Please call our clinic during office hours (8:00 AM - 5 PM) at [phone] if you have any questions regarding your care. Please call 911 if you are experiencing a medical emergency like chest pain, shortness of breath, coughing blood, you are too nauseous to eat or drink, or you feel weak or lightheaded.   It was a pleasure serving you during your stay with [place]! -[facility] team.     INSTRUCCIONES PARA EL PACIENTE:   Roy Andrews atendieron por una lesin aguda de los riones y calcio elevado en los examenes de laboratorio que le hicieron, y se Clinical cytogeneticist que tena linfadenopata mediastnica e hiliar y un ndulo pulmonar medial derecho que requera ms estudios por parte de un especialista en pulmones.   - El Dr. Levy Pupa, neumlogo de [facility] en [place], [place] vio mientras que estaba en el hospital, solicit laboratorios especiales y le realiz una broncoscopia. Se le notificar cuando los resultados de estos laboratorios y la broncoscopia con biopsias estn listos. El nmero de telfono de su [place] [phone] y la direccin de su oficina es [address] [place], [place] [address].   - La oficina de el Dr. Delton Coombes lo llamara para programar una cita en su clinica. Usted puede llamar al [phone])  [phone] si no lo han contactado el proximo [place], Julio 31. Septiembre 4 11:15 AM.      - Debe programar una cita con su mdico habitual, la Dra. Julieanne Manson, en las prximas 1 o 2 semanas. Ella puede [employer] de la broncoscopia con usted y Education officer, environmental un seguimiento de los anlisis de laboratorio necesarios. El nmero de telfono de su clnica es [phone].   - Le hemos informado a nuestro equipo de atencin que necesitar algunos recursos y le han pasado su informacin a un navegador financiero. Les informamos  que prefiere el espaol y lo llamarn para ayudarlo a calcular las facturas del hospital. Tambin puede pedirle a su mdico, la Dra. Mulberry, un programa de asistencia con medicamentos SI ES QUE NECESITA algn tratamiento mdico nuevo.  - Tus presiones arteriales han sido estables sin el medicamento para la alta presin, 114/66, 106/62, 110/65. Esto puede deberse a que su dieta fue diferente (menos sal, menos grasa) en el hospital. Verifique su presin arterial en su prxima visita con la Dra. Mulberry para que juntos decidan la necesidad de Educational psychologist con los medicamentos de alta presin.   - Agregamos un nuevo medicamento, "Metocarbamol/Robaxin" para los espasmos de los msculos de la espalda. Puede tomar una pastilla de este medicamento cada 6 horas segn sea necesario.   - Sus niveles de azcar en la sangre en ayunas han sido 163, 140, 120, 128, 111, 169 y 119. Sus niveles de International aid/development worker en la sangre durante el da han sido 127, 96, 102, 123, 100 y 134. Ha estado recibiendo unidades de insulina (3- 9). Por favor haga un seguimiento de sus medicamentos para la diabetes con la Dra. Mulberry en su prxima cita.   - Llame a nuestra clnica durante el horario de oficina (8:00 AM a 5:00 PM) al [phone] si tiene alguna pregunta sobre la atencin que recibi bajo nuestro cuidado. Llame al 911 si tiene una emergencia mdica como dolor en el pecho, dificultad para respirar, tos con [place], si es que tiene demasiadas nuseas para comer o beber, o se siente dbil o aturdido.   Fue un placer atenderlo durante su estancia con nosotros! Le deseamos buena salud en el futuro. - Dra. Priscila Arellano y el equipo de [facility] [place].

## 2022-12-05 ENCOUNTER — Encounter (HOSPITAL_COMMUNITY): Payer: Self-pay | Admitting: Emergency Medicine

## 2022-12-07 ENCOUNTER — Ambulatory Visit: Payer: Self-pay | Admitting: Internal Medicine

## 2022-12-08 ENCOUNTER — Ambulatory Visit: Payer: Self-pay | Admitting: Internal Medicine

## 2022-12-08 ENCOUNTER — Telehealth: Payer: Self-pay | Admitting: Pulmonary Disease

## 2022-12-08 ENCOUNTER — Encounter: Payer: Self-pay | Admitting: Internal Medicine

## 2022-12-08 VITALS — BP 136/72 | HR 88 | Resp 16 | Ht 69.75 in | Wt 337.0 lb

## 2022-12-08 DIAGNOSIS — R590 Localized enlarged lymph nodes: Secondary | ICD-10-CM

## 2022-12-08 DIAGNOSIS — R918 Other nonspecific abnormal finding of lung field: Secondary | ICD-10-CM

## 2022-12-08 DIAGNOSIS — E875 Hyperkalemia: Secondary | ICD-10-CM

## 2022-12-08 DIAGNOSIS — I10 Essential (primary) hypertension: Secondary | ICD-10-CM

## 2022-12-08 DIAGNOSIS — Z6841 Body Mass Index (BMI) 40.0 and over, adult: Secondary | ICD-10-CM

## 2022-12-08 DIAGNOSIS — N1831 Chronic kidney disease, stage 3a: Secondary | ICD-10-CM

## 2022-12-08 DIAGNOSIS — E1165 Type 2 diabetes mellitus with hyperglycemia: Secondary | ICD-10-CM

## 2022-12-08 MED ORDER — METFORMIN HCL ER 500 MG PO TB24: ORAL_TABLET | ORAL | 11 refills | Status: DC

## 2022-12-08 MED ORDER — GLIPIZIDE 10 MG PO TABS: 10.0000 mg | ORAL_TABLET | Freq: Two times a day (BID) | ORAL | 11 refills | Status: DC

## 2022-12-08 MED ORDER — OZEMPIC (0.25 OR 0.5 MG/DOSE) 2 MG/3ML ~~LOC~~ SOPN: PEN_INJECTOR | SUBCUTANEOUS | 6 refills | Status: DC

## 2022-12-08 MED ORDER — EMPAGLIFLOZIN 10 MG PO TABS: 10.0000 mg | ORAL_TABLET | Freq: Every day | ORAL | 11 refills | Status: DC

## 2022-12-08 MED ORDER — TIRZEPATIDE 2.5 MG/0.5ML ~~LOC~~ SOAJ: 2.5000 mg | SUBCUTANEOUS | 3 refills | Status: DC

## 2022-12-08 NOTE — Progress Notes (Signed)
    Subjective:    Patient ID: Roy Andrews, male   DOB: 21-Aug-1970, 52 y.o.   MRN: [id number]   HPI  Tereasa Coop interprets   Pulmonary nodule and bulky mediastinal and bilateral hilar lymph nodes.  Bronchoscopy with FNA of 3 lymph nodes showed caseating granulomas and no malignant cells. Acid fast smear and Quantiferon neg, sputum bacterial culture negative.  Resp fungal and TB cultures pending still.  Appears to be leaning toward Sarcoid  2.  DM:  All meds for DM were discontinued at discharge, apparently due to concern about kidney disease.   A1C was down to 8.9% when seen for back pain and dyspnea on the 17th of July.   He was taking Metformin ER 1000 mg twice daily and Jardiance 10 mg once daily and Glipizide 10 mg twice daily.   His GFR at lowest recently was 48.    3.  Morbid obesity:  has not started application for [facility].  4.  Hypertension:  Lisinopril was discontinued.  Check urine microalbumin/crea.   Current Meds  Medication Sig   AgaMatrix Ultra-Thin Lancets MISC Check blood glucose twice daily before meals   atorvastatin (LIPITOR) 40 MG tablet Take 1 tablet (40 mg total) by mouth daily.   blood glucose meter kit and supplies Dispense based on patient and insurance preference. Use up to four times daily as directed. (FOR ICD-10 E10.9, E11.9).   Blood Glucose Monitoring Suppl (AGAMATRIX PRESTO) w/Device KIT Check blood glucose twice daily before meals   methocarbamol (ROBAXIN) 500 MG tablet Take 1 tablet (500 mg total) by mouth every 6 (six) hours as needed for muscle spasms.   No Known Allergies   Review of Systems    Objective:   BP 136/72 (BP Location: Left Arm, Patient Position: Sitting, Cuff Size: Normal)   Pulse 88   Resp 16   Ht 5' 9.75" (1.772 m)   Wt (!) 337 lb (152.9 kg)   BMI 48.70 kg/m   Physical Exam NAD Morbidly obese HEENT:  PERRL, EOMI Neck:  Supple, No adenopathy Chest:  CTA CV:  RRR without murmur or rub.  Radial  and DP pulses normal and equal Abd:  S, NT, No HSM or mass, + BS LE:  No edema.   Assessment & Plan   ? Likely Sarcoid:  Call into Pulmonology, Dr. Wynona Neat, to see if recommends getting started on inhaled corticosteroids vs prednisone with biopsy results, ?likely Sarcoid or need to wait for final TB and fungal cultures.  Surprisingly, doing better without definitive treatment, though has not been physically active.    2.  DM:  A1C much improved prior to hospitalization, though still not at goal.  Mounjaro not covered at [facility] pharmacy through MAP, but Ozempic is, so will switch to having him apply for the latter for both control of blood glucose and weight loss.  He is to notify when he receives.   Restart Metformin, Glipizide, and Jardiance  3.  Obesity:  to work on diet and apply for Ozempic above.  Physical activity as tolerates.  4.  Hypertension:  Hold Lisinopril for now.  BP fine off meds for some time.    5.  CKD:  BMP.  Urine microalbumin/crea.

## 2022-12-08 NOTE — Telephone Encounter (Signed)
Dr. Nolon Lennert is calling needing to speak with DR.Olalere but hosp f/u not till sept. And best couse of treatment for pt.

## 2022-12-14 NOTE — Telephone Encounter (Signed)
Did this get done can we close encounter

## 2022-12-14 NOTE — Telephone Encounter (Signed)
Will try and call when I am in the office on friday

## 2022-12-15 NOTE — Telephone Encounter (Signed)
Dr Wynona Neat has stated he will try and call pt when he is here on 12/16/22.

## 2023-01-02 ENCOUNTER — Ambulatory Visit: Payer: Self-pay

## 2023-01-02 VITALS — BP 120/80 | HR 88

## 2023-01-02 DIAGNOSIS — Z79899 Other long term (current) drug therapy: Secondary | ICD-10-CM

## 2023-01-02 DIAGNOSIS — Z0130 Encounter for examination of blood pressure without abnormal findings: Secondary | ICD-10-CM

## 2023-01-03 LAB — BASIC METABOLIC PANEL
BUN/Creatinine Ratio: 22 — ABNORMAL HIGH (ref 9–20)
BUN: 29 mg/dL — ABNORMAL HIGH (ref 6–24)
CO2: 19 mmol/L — ABNORMAL LOW (ref 20–29)
Calcium: 9.4 mg/dL (ref 8.7–10.2)
Chloride: 102 mmol/L (ref 96–106)
Creatinine, Ser: 1.34 mg/dL — ABNORMAL HIGH (ref 0.76–1.27)
Glucose: 136 mg/dL — ABNORMAL HIGH (ref 70–99)
Potassium: 4.9 mmol/L (ref 3.5–5.2)
Sodium: 137 mmol/L (ref 134–144)
eGFR: 65 mL/min/{1.73_m2} (ref >59)

## 2023-01-04 NOTE — Progress Notes (Signed)
No changes to patients medication will be made.

## 2023-01-11 ENCOUNTER — Ambulatory Visit (INDEPENDENT_AMBULATORY_CARE_PROVIDER_SITE_OTHER): Payer: Self-pay | Admitting: Pulmonary Disease

## 2023-01-11 ENCOUNTER — Encounter: Payer: Self-pay | Admitting: Pulmonary Disease

## 2023-01-11 VITALS — BP 104/76 | HR 83 | Temp 97.2°F | Ht 71.0 in | Wt 340.0 lb

## 2023-01-11 DIAGNOSIS — D869 Sarcoidosis, unspecified: Secondary | ICD-10-CM

## 2023-01-11 LAB — SEDIMENTATION RATE: Sed Rate: 36 mm/h — ABNORMAL HIGH (ref 0–20)

## 2023-01-11 MED ORDER — FLUTICASONE FUROATE-VILANTEROL 100-25 MCG/ACT IN AEPB: 1.0000 | INHALATION_SPRAY | Freq: Every day | RESPIRATORY_TRACT | 2 refills | Status: DC

## 2023-01-11 MED ORDER — METHOCARBAMOL 500 MG PO TABS: 500.0000 mg | ORAL_TABLET | Freq: Four times a day (QID) | ORAL | 1 refills | Status: AC | PRN

## 2023-01-11 MED ORDER — GABAPENTIN 100 MG PO CAPS: ORAL_CAPSULE | ORAL | 2 refills | Status: DC

## 2023-01-11 NOTE — Patient Instructions (Addendum)
The biopsy that was done in the hospital is consistent with inflammatory condition called sarcoidosis  We will get a breathing study  Obtain some blood work-ESR, ACE level, calcium  Inhaler called Breo called to the pharmacy for you-may help shortness of breath -It is use once a day -Make sure you rinse your mouth after use  Continue weight loss efforts -This will help the pressure on the lungs -This will help your sleep apnea  Recommend to see an ophthalmologist to make sure sarcoidosis is not affecting the eyes  I will see you in about 6 to 8 weeks  I sent in refills for Robaxin which is for muscle spasms Neurontin will help nerve pain

## 2023-01-11 NOTE — Progress Notes (Signed)
Roy Andrews    [id number]    Jun 20, 1970  Primary Care Physician:Mulberry, Lanora Manis, MD  Referring Physician: Julieanne Manson, MD [address] [place],  [place] [address]  Chief complaint:   Patient being seen for follow-up Aid of interpreter  HPI:  Recently hospitalized  Found to have an abnormal CT showing mediastinal adenopathy and lung nodules  Biopsy revealing noncaseating granuloma  He admits to shortness of breath, dizziness with activity especially if it involves bending over  Denies known underlying lung disease Did have COVID that required hospitalization and on a ventilator in 2020  In 2009 and 2010, but pneumonia with significant phlegm production  No significant smoking history  History of obstructive sleep apnea, did not tolerate CPAP and give the machine back -Recommendation at the time was for him to focus on weight loss  Has lost about 30 pounds this year  He does have some back pain, shortness of breath with activity Denies a cough No night sweats  Outpatient Encounter Medications as of 01/11/2023  Medication Sig   aspirin EC 81 MG tablet Take 1 tablet (81 mg total) by mouth daily. Swallow whole.   atorvastatin (LIPITOR) 40 MG tablet Take 1 tablet (40 mg total) by mouth daily.   blood glucose meter kit and supplies Dispense based on patient and insurance preference. Use up to four times daily as directed. (FOR ICD-10 E10.9, E11.9).   Blood Glucose Monitoring Suppl (AGAMATRIX PRESTO) w/Device KIT Check blood glucose twice daily before meals   empagliflozin (JARDIANCE) 10 MG TABS tablet Take 1 tablet (10 mg total) by mouth daily before breakfast.   fluticasone furoate-vilanterol (BREO ELLIPTA) 100-25 MCG/ACT AEPB Inhale 1 puff into the lungs daily.   glucose blood (AGAMATRIX PRESTO TEST) test strip Check blood glucose twice daily before meals.   [DISCONTINUED] methocarbamol (ROBAXIN) 500 MG tablet Take 1 tablet (500 mg  total) by mouth every 6 (six) hours as needed for muscle spasms.   AgaMatrix Ultra-Thin Lancets MISC Check blood glucose twice daily before meals (Patient not taking: Reported on 01/11/2023)   gabapentin (NEURONTIN) 100 MG capsule 3 caps by mouth 3 times daily   glipiZIDE (GLUCOTROL) 10 MG tablet Take 1 tablet (10 mg total) by mouth 2 (two) times daily before a meal. (Patient not taking: Reported on 01/11/2023)   metFORMIN (GLUCOPHAGE-XR) 500 MG 24 hr tablet 2 tabs by mouth twice daily with meals (Patient not taking: Reported on 01/11/2023)   methocarbamol (ROBAXIN) 500 MG tablet Take 1 tablet (500 mg total) by mouth every 6 (six) hours as needed for muscle spasms.   Semaglutide,0.25 or 0.5MG /DOS, (OZEMPIC, 0.25 OR 0.5 MG/DOSE,) 2 MG/3ML SOPN 0.25 mg injected subcutaneously once weekly (Patient not taking: Reported on 01/11/2023)   [DISCONTINUED] gabapentin (NEURONTIN) 100 MG capsule 3 caps by mouth 3 times daily (Patient not taking: Reported on 01/11/2023)   No facility-administered encounter medications on file as of 01/11/2023.    Allergies as of 01/11/2023   (No Known Allergies)    Past Medical History:  Diagnosis Date   Diabetes mellitus without complication (HCC)    Dyslipidemia, goal LDL below 70    Hypertension    Obese     Past Surgical History:  Procedure Laterality Date   BRONCHIAL BRUSHINGS  12/02/2022   Procedure: BRONCHIAL BRUSHINGS;  Surgeon: Leslye Peer, MD;  Location: [facility] ENDOSCOPY;  Service: Pulmonary;;   BRONCHIAL NEEDLE ASPIRATION BIOPSY  12/02/2022   Procedure: BRONCHIAL NEEDLE  ASPIRATION BIOPSIES;  Surgeon: Leslye Peer, MD;  Location: [facility] ENDOSCOPY;  Service: Pulmonary;;   BRONCHIAL WASHINGS  12/02/2022   Procedure: BRONCHIAL WASHINGS;  Surgeon: Leslye Peer, MD;  Location: [facility] ENDOSCOPY;  Service: Pulmonary;;   NO PAST SURGERIES     right lower leg debridement Right    large puncture wound from sharp rusted metal object at 52 years of age   VIDEO BRONCHOSCOPY WITH  ENDOBRONCHIAL ULTRASOUND N/A 12/02/2022   Procedure: VIDEO BRONCHOSCOPY WITH ENDOBRONCHIAL ULTRASOUND;  Surgeon: Leslye Peer, MD;  Location: [facility] ENDOSCOPY;  Service: Pulmonary;  Laterality: N/A;    Family History  Problem Relation Age of Onset   Diabetes Mother    Glucose-6-phos deficiency Daughter     Social History   Socioeconomic History   Marital status: Married    Spouse name: Rupert Stacks   Number of children: 2   Years of education: Not on file   Highest education level: Not on file  Occupational History   Occupation: house pressure washing.  Tobacco Use   Smoking status: Never    Passive exposure: Never   Smokeless tobacco: Never  Vaping Use   Vaping status: Never Used  Substance and Sexual Activity   Alcohol use: No   Drug use: No   Sexual activity: Not Currently    Birth control/protection: None, Rhythm  Other Topics Concern   Not on file  Social History Narrative   Lives at home with wife and 2 children.   Social Determinants of Health   Financial Resource Strain: Medium Risk (11/03/2021)   Overall Financial Resource Strain (CARDIA)    Difficulty of Paying Living Expenses: Somewhat hard  Food Insecurity: Food Insecurity Present (11/03/2021)   Hunger Vital Sign    Worried About Running Out of Food in the Last Year: Sometimes true    Ran Out of Food in the Last Year: Sometimes true  Transportation Needs: No Transportation Needs (11/03/2021)   PRAPARE - Administrator, Civil Service (Medical): No    Lack of Transportation (Non-Medical): No  Physical Activity: Not on file  Stress: Not on file  Social Connections: Not on file  Intimate Partner Violence: Not At Risk (11/03/2021)   Humiliation, Afraid, Rape, and Kick questionnaire    Fear of Current or Ex-Partner: No    Emotionally Abused: No    Physically Abused: No    Sexually Abused: No    Review of Systems  Constitutional:  Positive for fatigue.  Respiratory:  Positive  for apnea and shortness of breath.   Musculoskeletal:  Positive for arthralgias and back pain.    Vitals:   01/11/23 1120  BP: 104/76  Pulse: 83  Temp: (!) 97.2 F (36.2 C)  SpO2: 95%     Physical Exam Constitutional:      Appearance: He is obese.  HENT:     Head: Normocephalic.     Nose: Nose normal.     Mouth/Throat:     Mouth: Mucous membranes are moist.  Cardiovascular:     Rate and Rhythm: Normal rate and regular rhythm.     Heart sounds: No murmur heard.    No friction rub.  Pulmonary:     Effort: No respiratory distress.     Breath sounds: No stridor. No wheezing or rhonchi.  Musculoskeletal:     Cervical back: No rigidity or tenderness.  Neurological:     Mental Status: He is alert.  Psychiatric:  Mood and Affect: Mood normal.    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  He does have shortness of breath which is likely multifactorial due to muscle weakness, He has no cough Has some back discomfort that was helped with muscle relaxants and Neurontin while he was in the hospital  He has a history of diabetes with gastroparesis  Obstructive sleep apnea -He is working on weight loss  Plan/Recommendations: Obtain ESR, ACE level  Encouraged to obtain evaluation by ophthalmology  Will require repeat CT in a few months to follow-up on the lung nodule  Schedule for pulmonary function test  Refills for his Neurontin and Robaxin  Graded activities as tolerated  Informational material regarding sarcoidosis provided  Empiric trial with Breo  Monitoring of symptoms  Treatment options for  sarcoidosis was discussed with patient, I believe it is best to hold off on initiating steroids at present with his history of diabetes  Virl Diamond MD [facility] 01/11/2023, 12:56 PM  CC: Julieanne Manson, MD

## 2023-01-13 ENCOUNTER — Other Ambulatory Visit: Payer: Self-pay | Admitting: Internal Medicine

## 2023-01-13 LAB — ANGIOTENSIN CONVERTING ENZYME: Angiotensin-Converting Enzyme: 84 U/L — ABNORMAL HIGH (ref 9–67)

## 2023-01-13 MED ORDER — FLUTICASONE FUROATE-VILANTEROL 100-25 MCG/ACT IN AEPB: 1.0000 | INHALATION_SPRAY | Freq: Every day | RESPIRATORY_TRACT | 2 refills | Status: DC

## 2023-01-17 NOTE — Telephone Encounter (Signed)
Patient has been notified

## 2023-01-27 ENCOUNTER — Other Ambulatory Visit: Payer: Self-pay

## 2023-01-27 DIAGNOSIS — Z79899 Other long term (current) drug therapy: Secondary | ICD-10-CM

## 2023-01-27 DIAGNOSIS — E1165 Type 2 diabetes mellitus with hyperglycemia: Secondary | ICD-10-CM

## 2023-01-28 LAB — HEPATIC FUNCTION PANEL
ALT: 41 IU/L (ref 0–44)
AST: 26 IU/L (ref 0–40)
Albumin: 4 g/dL — ABNORMAL LOW (ref 4.1–5.1)
Alkaline Phosphatase: 158 IU/L — ABNORMAL HIGH (ref 44–121)
Bilirubin Total: 0.5 mg/dL (ref 0.0–1.2)
Bilirubin, Direct: 0.16 mg/dL (ref 0.00–0.40)
Total Protein: 7.5 g/dL (ref 6.0–8.5)

## 2023-01-28 LAB — LIPID PANEL W/O CHOL/HDL RATIO
Cholesterol, Total: 118 mg/dL (ref 100–199)
HDL: 27 mg/dL — ABNORMAL LOW (ref >39)
LDL Chol Calc (NIH): 65 mg/dL (ref 0–99)
Triglycerides: 151 mg/dL — ABNORMAL HIGH (ref 0–149)
VLDL Cholesterol Cal: 26 mg/dL (ref 5–40)

## 2023-01-28 LAB — HEMOGLOBIN A1C
Est. average glucose Bld gHb Est-mCnc: 146 mg/dL
Hgb A1c MFr Bld: 6.7 % — ABNORMAL HIGH (ref 4.8–5.6)

## 2023-01-30 ENCOUNTER — Ambulatory Visit (INDEPENDENT_AMBULATORY_CARE_PROVIDER_SITE_OTHER): Payer: Self-pay | Admitting: Internal Medicine

## 2023-01-30 ENCOUNTER — Encounter: Payer: Self-pay | Admitting: Internal Medicine

## 2023-01-30 VITALS — BP 134/88 | HR 80 | Resp 16 | Ht 71.0 in | Wt 335.0 lb

## 2023-01-30 DIAGNOSIS — B353 Tinea pedis: Secondary | ICD-10-CM | POA: Insufficient documentation

## 2023-01-30 DIAGNOSIS — G8929 Other chronic pain: Secondary | ICD-10-CM | POA: Insufficient documentation

## 2023-01-30 DIAGNOSIS — Z23 Encounter for immunization: Secondary | ICD-10-CM

## 2023-01-30 DIAGNOSIS — D860 Sarcoidosis of lung: Secondary | ICD-10-CM | POA: Insufficient documentation

## 2023-01-30 DIAGNOSIS — M546 Pain in thoracic spine: Secondary | ICD-10-CM

## 2023-01-30 DIAGNOSIS — Z599 Problem related to housing and economic circumstances, unspecified: Secondary | ICD-10-CM

## 2023-01-30 DIAGNOSIS — E1165 Type 2 diabetes mellitus with hyperglycemia: Secondary | ICD-10-CM

## 2023-01-30 DIAGNOSIS — Z6841 Body Mass Index (BMI) 40.0 and over, adult: Secondary | ICD-10-CM

## 2023-01-30 DIAGNOSIS — I10 Essential (primary) hypertension: Secondary | ICD-10-CM

## 2023-01-30 MED ORDER — TERBINAFINE HCL 1 % EX CREA: TOPICAL_CREAM | CUTANEOUS | Status: AC

## 2023-01-30 MED ORDER — GABAPENTIN 100 MG PO CAPS: ORAL_CAPSULE | ORAL | 11 refills | Status: DC

## 2023-01-30 NOTE — Progress Notes (Signed)
Subjective:    Patient ID: Roy Andrews, male   DOB: 1971/02/14, 52 y.o.   MRN: [id number]   HPI  Tereasa Coop interprets   Pulmonary Sarcoidosis:  He has yet to start the [facility].  Very expensive at a retail pharmacy, so sent the Rx to [employer] at [facility] to apply through pharmaceutical company for free medication for a year.  He was notified Virgel Bouquet was in last Friday afternoon and plans to pick up today.  Feels he is doing okay with breathing   Dr. Wynona Neat recommended he be seen by eye specialist for eye involvement.  He has an appt with Dr. Elicia Lamp in [place] in October.  He also needs help in applying for financial assistance with [facility].  He does not have any employer documentation.  Neither does his wife. He has not called the phone number on the paperwork he received last week to get help.  He does have an appt with cardiology from original referral in July when presented with what was felt to be exertional dyspnea.  Certainly with multiple risk factors.  He does continue to get fatigued with dyspnea and stops what he is doing when that occurs. Has not started inhaled corticosteroids for sarcoid as above.    2.  DM/Obesity:  Started Ozempic injections last Friday.  Sugars fasting now in 90s.  He is not checking evening sugar before dinner.  A1C recently 6.7%.    3.  Blood pressure:  discussed high normal.    He is not taking Lisinopril currently.    4.  Hyperlipidemia:  LDL at goal, but HDL remains low and trigs slightly high.  He cleans the home and mows the lawn.  He does not walk or do something for enjoyment.    5.  Elevated BUN and Crea:  He is now drinking more water.    Current Meds  Medication Sig   aspirin EC 81 MG tablet Take 1 tablet (81 mg total) by mouth daily. Swallow whole.   atorvastatin (LIPITOR) 40 MG tablet Take 1 tablet (40 mg total) by mouth daily.   blood glucose meter kit and supplies Dispense based on patient and insurance preference. Use  up to four times daily as directed. (FOR ICD-10 E10.9, E11.9).   Blood Glucose Monitoring Suppl (AGAMATRIX PRESTO) w/Device KIT Check blood glucose twice daily before meals   empagliflozin (JARDIANCE) 10 MG TABS tablet Take 1 tablet (10 mg total) by mouth daily before breakfast.   glipiZIDE (GLUCOTROL) 10 MG tablet Take 1 tablet (10 mg total) by mouth 2 (two) times daily before a meal.   glucose blood (AGAMATRIX PRESTO TEST) test strip Check blood glucose twice daily before meals.   metFORMIN (GLUCOPHAGE-XR) 500 MG 24 hr tablet 2 tabs by mouth twice daily with meals (Patient taking differently: 1 tabs by mouth twice daily with meals)   Semaglutide,0.25 or 0.5MG /DOS, (OZEMPIC, 0.25 OR 0.5 MG/DOSE,) 2 MG/3ML SOPN 0.25 mg injected subcutaneously once weekly   No Known Allergies   Review of Systems    Objective:   BP 134/88 (BP Location: Left Arm, Patient Position: Sitting, Cuff Size: Normal)   Pulse 80   Resp 16   Ht 5\' 11"  (1.803 m)   Wt (!) 335 lb (152 kg)   BMI 46.72 kg/m   Physical Exam NAD Lungs:  CTA CV:  RRR without murmur or rub.  Radial and DP pulses normal and equal Abd:  S, + BS LE:  No edema.  Diabetic Foot Exam - Simple   Simple Foot Form Diabetic Foot exam was performed with the following findings: Yes 01/30/2023  3:02 PM  Visual Inspection See comments: Yes Sensation Testing Intact to touch and monofilament testing bilaterally: Yes Pulse Check Posterior Tibialis and Dorsalis pulse intact bilaterally: Yes Comments Flaking of plantar and sides of feet bilaterally.       Assessment & Plan   Sarcoidosis:  To start inhaled corticosteroid today.    Note to [place] [facility] to also evaluate eyes for sarcoid involvement as well.   Will ask CHW, Amparo Bristol to help him with documents for [facility] financial support.  2.  DM/obesity:  Has initiated Ozempic.  He will document blood glucose twice daily before meals and bring in after 1 month.    3.   Hyperlipidemia:  all at goal save for mildly high trigs and very low HDL.  Encouraged daily walking for pleasure rather than just household work.  Encouraged good fats in diet as well:  fish, nuts, olives, avocados.   4.  Hypertension:  BP fine thus far off Lisinopril  5.  Elevated BUN/Crea:  trying to hydrate more.  Add BMP to labs performed last week.    6.  Tinea pedis:  Terbinafine cream twice daily for at least 14 days.    7.  Back pain:  he has not refilled the methocarbamol.  He never obtained gabapentin--less expensive at [facility], so sending there and gradually increasing to 300 mg 3 times daily.

## 2023-01-30 NOTE — Patient Instructions (Signed)
Gabapentin 100 mg capsula: 1 capsula en la noche.  En 3 dias, sube a 2 capsulas en la noche, en 3 mas dias, sube a 3 capsulas en la noche. En 3 dias, anade 1 capsula en la manana y sigue 3 capsulas en la noche En 3 dias, sube a 2 capsulas en la manana y 3 capsulas en la noche En 3 dias, sube a 3 capsulas en la manana y 3 capsulas en la noche En 3 dias sigue 3 capsulas en la manana y la noche y sigue 1 capsula medio dia En 3 dias, sube 2 capsulas el medio dia y sigue 3 capsulas en la manana y en la noche En 3 dias sube 3 capsulas el medio dia, en la manana y en la noche

## 2023-01-31 ENCOUNTER — Ambulatory Visit: Payer: MEDICAID | Admitting: Internal Medicine

## 2023-02-01 LAB — SPECIMEN STATUS REPORT

## 2023-02-01 LAB — BASIC METABOLIC PANEL
BUN/Creatinine Ratio: 24 — ABNORMAL HIGH (ref 9–20)
BUN: 28 mg/dL — ABNORMAL HIGH (ref 6–24)
CO2: 17 mmol/L — ABNORMAL LOW (ref 20–29)
Calcium: 9.7 mg/dL (ref 8.7–10.2)
Chloride: 100 mmol/L (ref 96–106)
Creatinine, Ser: 1.16 mg/dL (ref 0.76–1.27)
Glucose: 115 mg/dL — ABNORMAL HIGH (ref 70–99)
Potassium: 4.8 mmol/L (ref 3.5–5.2)
Sodium: 136 mmol/L (ref 134–144)
eGFR: 77 mL/min/{1.73_m2} (ref >59)

## 2023-02-07 ENCOUNTER — Ambulatory Visit: Payer: Self-pay | Attending: Internal Medicine | Admitting: Cardiology

## 2023-02-07 ENCOUNTER — Encounter: Payer: Self-pay | Admitting: Cardiology

## 2023-02-07 VITALS — BP 102/76 | HR 93 | Ht 71.0 in | Wt 330.0 lb

## 2023-02-07 DIAGNOSIS — I10 Essential (primary) hypertension: Secondary | ICD-10-CM

## 2023-02-07 DIAGNOSIS — E785 Hyperlipidemia, unspecified: Secondary | ICD-10-CM

## 2023-02-07 DIAGNOSIS — E1169 Type 2 diabetes mellitus with other specified complication: Secondary | ICD-10-CM

## 2023-02-07 NOTE — Progress Notes (Addendum)
Cardiology Office Note:  .   Date:  02/07/2023  ID:  [name], [place] [birth year], MRN [id number] PCP: Julieanne Manson, MD  [facility] Providers Cardiologist:  Truett Mainland, MD PCP: Julieanne Manson, MD   History of Present Illness: .    52 year old Hispanic male with obesity, type 2 DM, hyperlipidemia pulmonary sarcoidosis, referred for evaluation of chest pain and dyspnea on exertion.  Patient is here today with his wife.  Spanish language interpreter services were used.  Patient used to be a Corporate investment banker, but has been out of regular work since having had COVID in 2020 that required him to be intubated.  He is following pulmonology for management of sarcoidosis.  Recently, he has had exertional dyspnea.  However, on further review, his limiting symptoms is really his back pain, which sometimes does followed by exertional dyspnea.  He denies any specific chest pain symptoms.  His diabetes is being managed by his PCP.  He is also on Ozempic with which he has had some weight loss.  Vitals:   02/07/23 1453  BP: 102/76  Pulse: 93  SpO2: 95%     ROS:  Review of Systems  Cardiovascular:  Positive for dyspnea on exertion. Negative for chest pain, leg swelling, palpitations and syncope.     Studies Reviewed: Marland Kitchen   EKG Interpretation Date/Time:  Tuesday February 07 2023 14:48:36 EDT Ventricular Rate:  88 PR Interval:  174 QRS Duration:  88 QT Interval:  334 QTC Calculation: 404 R Axis:   117  Text Interpretation: Normal sinus rhythm Right axis deviation Possible Right ventricular hypertrophy When compared with ECG of 26-Nov-2022 11:29, No significant change was found Confirmed by Truett Mainland [phone]) on 02/07/2023 2:52:46 PM      Physical Exam:   Physical Exam Vitals and nursing note reviewed.  Constitutional:      General: He is not in acute distress.    Appearance: He is obese.  Neck:     Vascular: No JVD.  Cardiovascular:      Rate and Rhythm: Normal rate and regular rhythm.     Heart sounds: Normal heart sounds. No murmur heard. Pulmonary:     Effort: Pulmonary effort is normal.     Breath sounds: Normal breath sounds. No wheezing or rales.  Musculoskeletal:     Right lower leg: No edema.     Left lower leg: No edema.      VISIT DIAGNOSES:   ICD-10-CM   1. Primary hypertension  I10 EKG 12-Lead    Myocardial Perfusion Imaging    2. Dyslipidemia associated with type 2 diabetes mellitus (HCC)  E11.69 Myocardial Perfusion Imaging   E78.5      Informed Consent   Shared Decision Making/Informed Consent The risks [chest pain, shortness of breath, cardiac arrhythmias, dizziness, blood pressure fluctuations, myocardial infarction, stroke/transient ischemic attack, nausea, vomiting, allergic reaction, radiation exposure, metallic taste sensation and life-threatening complications (estimated to be 1 in 10,000)], benefits (risk stratification, diagnosing coronary artery disease, treatment guidance) and alternatives of a nuclear stress test were discussed in detail with Mr. Karrie Meres and he agrees to proceed.  ASSESSMENT AND PLAN: .     52 year old Hispanic male with obesity, type 2 DM, hyperlipidemia pulmonary sarcoidosis, referred for evaluation of chest pain and dyspnea on exertion.  Dyspnea on exertion: No specific chest pain, but symptoms could be angina.  In the setting of his known diabetes, I will obtain exercise nuclear stress testing.  With his morbid obesity, he will  likely need to restudy.  Continue follow with PCP regarding management of diabetes mellitus and hyperlipidemia, and with pulmonology regarding management of pulmonary sarcoidosis.    F/u as needed  Signed, Elder Negus, MD

## 2023-02-07 NOTE — Patient Instructions (Signed)
Medication Instructions:   *If you need a refill on your cardiac medications before your next appointment, please call your pharmacy*   Lab Work:  If you have labs (blood work) drawn today and your tests are completely normal, you will receive your results only by: MyChart Message (if you have MyChart) OR A paper copy in the mail If you have any lab test that is abnormal or we need to change your treatment, we will call you to review the results.   Testing/Procedures:  2 DAY EXERCISE MYOVIEW    Follow-Up: At [facility], you and your health needs are our priority.  As part of our continuing mission to provide you with exceptional heart care, we have created designated Provider Care Teams.  These Care Teams include your primary Cardiologist (physician) and Advanced Practice Providers (APPs -  Physician Assistants and Nurse Practitioners) who all work together to provide you with the care you need, when you need it.  We recommend signing up for the patient portal called "MyChart".  Sign up information is provided on this After Visit Summary.  MyChart is used to connect with patients for Virtual Visits (Telemedicine).  Patients are able to view lab/test results, encounter notes, upcoming appointments, etc.  Non-urgent messages can be sent to your provider as well.   To learn more about what you can do with MyChart, go to [email or web address].    Your next appointment:  AS NEEDED

## 2023-02-09 ENCOUNTER — Ambulatory Visit: Payer: Self-pay | Admitting: Internal Medicine

## 2023-02-09 ENCOUNTER — Encounter: Payer: Self-pay | Admitting: Internal Medicine

## 2023-02-09 VITALS — BP 122/80 | HR 80 | Resp 16 | Ht 71.0 in | Wt 334.0 lb

## 2023-02-09 DIAGNOSIS — N50811 Right testicular pain: Secondary | ICD-10-CM

## 2023-02-09 LAB — POCT URINALYSIS DIPSTICK
Bilirubin, UA: NEGATIVE
Blood, UA: NEGATIVE
Glucose, UA: POSITIVE — AB
Ketones, UA: NEGATIVE
Leukocytes, UA: NEGATIVE
Nitrite, UA: NEGATIVE
Protein, UA: NEGATIVE
Spec Grav, UA: 1.025 (ref 1.010–1.025)
Urobilinogen, UA: 0.2 U/dL
pH, UA: 6.5 (ref 5.0–8.0)

## 2023-02-09 MED ORDER — SULFAMETHOXAZOLE-TRIMETHOPRIM 800-160 MG PO TABS: ORAL_TABLET | ORAL | 0 refills | Status: DC

## 2023-02-09 NOTE — Patient Instructions (Signed)
Call progress report on Monday

## 2023-02-09 NOTE — Progress Notes (Unsigned)
Subjective:    Patient ID: Roy Andrews, male   DOB: 1970/09/20, 52 y.o.   MRN: [id number]   HPI  Roy Andrews interprets  Right testicle with pain for about 11 days.  States it is at the top of the testicle.  Has had this before, but tends to go away quickly.  This time seems to be staying with him. Pain is constant, but intensity varies No erythema or swelling around the testicle Describes where vessels go to and from testicle, feels like a "ball" with mild tenderness. No intermittent mass or enlargement in his groin area. No penile discharge No dysuria or urinary frequency.   No fever.  He is sexually active, did not have increased pain with ejaculation.      He has had this occur once monthly for the past 6 months.  Generally, lasts about 30 minutes and then is gone.   The pain also seems to be radiating straight up and back to his right posterior pelvic rim area.  Has more pain when he bends forward.  Does not have to pick anything up.  At check out, wanted to share problems with inability to retract foreskin and penis seems to be shrinking as well as difficulties with ED.     Current Meds  Medication Sig   AgaMatrix Ultra-Thin Lancets MISC Check blood glucose twice daily before meals   aspirin EC 81 MG tablet Take 1 tablet (81 mg total) by mouth daily. Swallow whole.   atorvastatin (LIPITOR) 40 MG tablet Take 1 tablet (40 mg total) by mouth daily.   blood glucose meter kit and supplies Dispense based on patient and insurance preference. Use up to four times daily as directed. (FOR ICD-10 E10.9, E11.9).   Blood Glucose Monitoring Suppl (AGAMATRIX PRESTO) w/Device KIT Check blood glucose twice daily before meals   empagliflozin (JARDIANCE) 10 MG TABS tablet Take 1 tablet (10 mg total) by mouth daily before breakfast.   fluticasone furoate-vilanterol (BREO ELLIPTA) 100-25 MCG/ACT AEPB Inhale 1 puff into the lungs daily.   glipiZIDE (GLUCOTROL) 10 MG tablet  Take 1 tablet (10 mg total) by mouth 2 (two) times daily before a meal.   glucose blood (AGAMATRIX PRESTO TEST) test strip Check blood glucose twice daily before meals.   metFORMIN (GLUCOPHAGE-XR) 500 MG 24 hr tablet 2 tabs by mouth twice daily with meals (Patient taking differently: 1 tabs by mouth twice daily with meals)   Semaglutide,0.25 or 0.5MG /DOS, (OZEMPIC, 0.25 OR 0.5 MG/DOSE,) 2 MG/3ML SOPN 0.25 mg injected subcutaneously once weekly   No Known Allergies   Review of Systems    Objective:   BP 122/80 (BP Location: Left Arm, Patient Position: Sitting, Cuff Size: Normal)   Pulse 80   Resp 16   Ht 5\' 11"  (1.803 m)   Wt (!) 334 lb (151.5 kg)   BMI 46.58 kg/m   Physical Exam Large fat pad.  Assessment & Plan  Right Epididymitis:  UA, culture if abnormal.  Bactrim DS 1 tab by mouth twice daily for 10 days.

## 2023-02-13 ENCOUNTER — Telehealth: Payer: Self-pay | Admitting: Licensed Clinical Social Worker

## 2023-02-13 NOTE — Progress Notes (Unsigned)
Heart and Vascular Care Navigation  02/13/2023  [facility] Roy Andrews Roy Andrews 15-May-1970 [id number]  Reason for Referral: uninsured, CAFA pending Patient is participating in a Managed Medicaid Plan: No, self pay only  Engaged with patient by telephone for initial visit for Heart and Vascular Care Coordination.                                                                                                   Assessment:                                     LCSW reached pt today at [phone]. Introduced self, role, reason for call.   HRT/VAS Care Coordination     Patients [facility] Cardiology Office [facility]   Outpatient Care Team Social Worker   Social Worker Name: Octavio Graves, [place], [phone]   Living arrangements for the past 2 months Single Family Home   Lives with: Spouse; Adult Children   Patient Current Insurance Coverage Self-Pay   Patient Has Concern With Paying Medical Bills Yes   Patient Concerns With Medical Bills uninsured- did not bring in full paperwork for CAFA   Medical Bill Referrals: CAFA/approved for [employer]   Does Patient Have Prescription Coverage? No   Home Assistive Devices/Equipment None   DME Agency [facility]       Social History:                                                                             SDOH Screenings   Food Insecurity: Food Insecurity Present (11/03/2021)  Housing: Low Risk  (02/13/2023)  Transportation Needs: No Transportation Needs (02/13/2023)  Utilities: Not At Risk (02/13/2023)  Depression (PHQ2-9): Low Risk  (10/31/2018)  Financial Resource Strain: Medium Risk (02/13/2023)  Tobacco Use: Low Risk  (02/09/2023)    SDOH Interventions: Financial Resources:  Financial Strain Interventions: Other (Comment), Artist (approved for OC; working on [employer]; health dept helps with medications) Editor, commissioning for [employer] Program  Food Insecurity:  Food Insecurity Interventions: Other  (Comment) (will mail food assistance)  Housing Insecurity:  Housing Interventions: Intervention Not Indicated  Transportation:   Transportation Interventions: Intervention Not Indicated    Other Care Navigation Interventions:     Provided Pharmacy assistance resources  Will review for Med[employer]ist; receives some medications from Health Dept   Follow-up plan:   LCSW has communicated with financial counseling department, let them know pt will bring additional documents and may need assistance with interpreter to ensure he has all needed documents turned in. Mailed additional food resources for pt as well as red in SDOH wheel.

## 2023-02-14 ENCOUNTER — Ambulatory Visit: Payer: Self-pay | Admitting: Psychology

## 2023-02-14 DIAGNOSIS — Z09 Encounter for follow-up examination after completed treatment for conditions other than malignant neoplasm: Secondary | ICD-10-CM

## 2023-02-15 ENCOUNTER — Telehealth (HOSPITAL_COMMUNITY): Payer: Self-pay | Admitting: *Deleted

## 2023-02-15 ENCOUNTER — Other Ambulatory Visit: Payer: Self-pay

## 2023-02-15 DIAGNOSIS — R0609 Other forms of dyspnea: Secondary | ICD-10-CM

## 2023-02-15 NOTE — Progress Notes (Signed)
Done.  Thanks MJP

## 2023-02-15 NOTE — Telephone Encounter (Signed)
Per interpreter # [phone]: Left message on voicemail per DPR in reference to upcoming appointment scheduled on 02/16/2023 at 1:00 with detailed instructions given per Myocardial Perfusion Study Information Sheet for the test. LM to arrive 15 minutes early, and that it is imperative to arrive on time for appointment to keep from having the test rescheduled. If you need to cancel or reschedule your appointment, please call the office within 24 hours of your appointment. Failure to do so may result in a cancellation of your appointment, and a $50 no show fee. Phone number given for call back for any questions.

## 2023-02-16 ENCOUNTER — Ambulatory Visit (HOSPITAL_COMMUNITY): Payer: Self-pay | Attending: Internal Medicine

## 2023-02-16 DIAGNOSIS — E1169 Type 2 diabetes mellitus with other specified complication: Secondary | ICD-10-CM | POA: Insufficient documentation

## 2023-02-16 DIAGNOSIS — E785 Hyperlipidemia, unspecified: Secondary | ICD-10-CM | POA: Insufficient documentation

## 2023-02-16 DIAGNOSIS — I10 Essential (primary) hypertension: Secondary | ICD-10-CM | POA: Insufficient documentation

## 2023-02-16 MED ORDER — TECHNETIUM TC 99M TETROFOSMIN IV KIT
30.7000 | PACK | Freq: Once | INTRAVENOUS | Status: AC | PRN
  Administered 2023-02-16: 30.7 via INTRAVENOUS

## 2023-02-16 MED ORDER — REGADENOSON 0.4 MG/5ML IV SOLN
0.4000 mg | Freq: Once | INTRAVENOUS | Status: AC
  Administered 2023-02-16: 0.4 mg via INTRAVENOUS

## 2023-02-17 ENCOUNTER — Telehealth (HOSPITAL_BASED_OUTPATIENT_CLINIC_OR_DEPARTMENT_OTHER): Payer: Self-pay | Admitting: Licensed Clinical Social Worker

## 2023-02-17 ENCOUNTER — Ambulatory Visit (HOSPITAL_COMMUNITY): Payer: Self-pay | Attending: Internal Medicine

## 2023-02-17 ENCOUNTER — Telehealth: Payer: Self-pay

## 2023-02-17 MED ORDER — TECHNETIUM TC 99M TETROFOSMIN IV KIT
28.5000 | PACK | Freq: Once | INTRAVENOUS | Status: AC | PRN
  Administered 2023-02-17: 28.5 via INTRAVENOUS

## 2023-02-17 NOTE — Telephone Encounter (Signed)
Patient wanted to report low blood sugar past two mornings. Sugars have been in the 70s. Afternoon sugars have been in 120s. This is before meals. Reporting because he wants to make sure it is not concerning.

## 2023-02-17 NOTE — Telephone Encounter (Signed)
Have him cut back to 5 mg or half of his glipizide twice daily with meals.

## 2023-02-17 NOTE — Telephone Encounter (Signed)
Patient has been notified of Rx change.

## 2023-02-17 NOTE — Telephone Encounter (Signed)
H&V Care Navigation CSW Progress Note  Clinical Social Worker contacted patient by phone to f/u on patient assistance documents missing from CAFA. Was able to reach him at [phone] with assistance of Byrd Hesselbach, [place] language interpreter [phone]. Just dropped documents off, no additional questions. Will review account for any updates next week.  Patient is participating in a Managed Medicaid Plan:  No, self pay only  SDOH Screenings   Food Insecurity: Food Insecurity Present (11/03/2021)  Housing: Low Risk  (02/13/2023)  Transportation Needs: No Transportation Needs (02/13/2023)  Utilities: Not At Risk (02/13/2023)  Depression (PHQ2-9): Low Risk  (10/31/2018)  Financial Resource Strain: Medium Risk (02/13/2023)  Tobacco Use: Low Risk  (02/09/2023)   Octavio Graves, MSW, LCSW Clinical Social Worker II [facility] Heart/Vascular Care Navigation  [phone]- work cell phone (preferred) [phone]- desk phone

## 2023-02-20 ENCOUNTER — Telehealth: Payer: Self-pay | Admitting: Licensed Clinical Social Worker

## 2023-02-20 NOTE — Telephone Encounter (Signed)
H&V Care Navigation CSW Progress Note  Clinical Social Worker contacted patient by phone to f/u on additional needed documents. Pt reached at [phone] with assistance of Spanish language interpreter [place], [phone]. Shared that the bank statements pt brought in were transaction ledger- we need formal bank statements from the last three months (confirmed with Meriam Sprague, Artist). Pt states understanding, will go to bank to get those. Provided him with Beverly's number and name for f/u.   Patient is participating in a Managed Medicaid Plan:  No, self pay only  SDOH Screenings   Food Insecurity: Food Insecurity Present (11/03/2021)  Housing: Low Risk  (02/13/2023)  Transportation Needs: No Transportation Needs (02/13/2023)  Utilities: Not At Risk (02/13/2023)  Depression (PHQ2-9): Low Risk  (10/31/2018)  Financial Resource Strain: Medium Risk (02/13/2023)  Tobacco Use: Low Risk  (02/09/2023)   Octavio Graves, MSW, LCSW Clinical Social Worker II [facility] Heart/Vascular Care Navigation  [phone]- work cell phone (preferred) [phone]- desk phone

## 2023-02-21 ENCOUNTER — Other Ambulatory Visit: Payer: Self-pay

## 2023-02-21 LAB — MYOCARDIAL PERFUSION IMAGING
LV dias vol: 83 mL (ref 62–150)
LV sys vol: 40 mL
Nuc Stress EF: 52 %
Peak HR: 107 {beats}/min
Rest HR: 77 {beats}/min
Rest Nuclear Isotope Dose: 28.5 mCi
SDS: 0
SRS: 0
SSS: 0
ST Depression (mm): 0 mm
Stress Nuclear Isotope Dose: 30.7 mCi
TID: 0.98

## 2023-02-21 MED ORDER — AGAMATRIX ULTRA-THIN LANCETS MISC: 11 refills | Status: AC

## 2023-02-21 MED ORDER — AGAMATRIX PRESTO TEST VI STRP: ORAL_STRIP | 11 refills | Status: AC

## 2023-02-22 ENCOUNTER — Other Ambulatory Visit: Payer: Self-pay

## 2023-02-22 ENCOUNTER — Ambulatory Visit: Payer: Self-pay | Admitting: Psychology

## 2023-02-22 DIAGNOSIS — Z09 Encounter for follow-up examination after completed treatment for conditions other than malignant neoplasm: Secondary | ICD-10-CM

## 2023-02-22 MED ORDER — ATORVASTATIN CALCIUM 40 MG PO TABS: 40.0000 mg | ORAL_TABLET | Freq: Every day | ORAL | 11 refills | Status: DC

## 2023-02-22 MED ORDER — GABAPENTIN 100 MG PO CAPS: ORAL_CAPSULE | ORAL | 11 refills | Status: DC

## 2023-02-23 LAB — HM DIABETES EYE EXAM

## 2023-02-27 ENCOUNTER — Telehealth: Payer: Self-pay | Admitting: Licensed Clinical Social Worker

## 2023-02-27 NOTE — Telephone Encounter (Signed)
H&V Care Navigation CSW Progress Note  Clinical Social Worker contacted patient by phone to f/u on needed bank statements for [employer] application- pt had been working on getting these. Financial counselor Meriam Sprague contacted this morning and shared she still doesn't have them. Attempted pt at [phone], no answer on second call and left a voicemail with assistance of Spanish language interpreter Blossom Hoops [phone]. Will re-attempt again if still not received this week.  Patient is participating in a Managed Medicaid Plan:  No, self pay only  SDOH Screenings   Food Insecurity: Food Insecurity Present (11/03/2021)  Housing: Low Risk  (02/13/2023)  Transportation Needs: No Transportation Needs (02/13/2023)  Utilities: Not At Risk (02/13/2023)  Depression (PHQ2-9): Low Risk  (10/31/2018)  Financial Resource Strain: Medium Risk (02/13/2023)  Tobacco Use: Low Risk  (02/09/2023)    Octavio Graves, MSW, LCSW Clinical Social Worker II [facility] Heart/Vascular Care Navigation  [phone]- work cell phone (preferred) [phone]- desk phone

## 2023-03-01 ENCOUNTER — Telehealth: Payer: Self-pay | Admitting: Licensed Clinical Social Worker

## 2023-03-01 ENCOUNTER — Other Ambulatory Visit (INDEPENDENT_AMBULATORY_CARE_PROVIDER_SITE_OTHER): Payer: Self-pay

## 2023-03-01 VITALS — Wt 340.0 lb

## 2023-03-01 DIAGNOSIS — Z23 Encounter for immunization: Secondary | ICD-10-CM

## 2023-03-01 NOTE — Telephone Encounter (Signed)
H&V Care Navigation CSW Progress Note  Clinical Social Worker  reviewed chart  to see if any updates about documents for [employer]- note pt had brought in paperwork.  CHARITY COMPLETE. PATIENT APPROVED FOR 100% FINANCIAL ASSISTANCE PER CAFA APPLICATION AND SUPPORTING DOCUMENTATION. CAFA APP SCANNED TO ACCOUNT [id number] APPROVAL DATES OF FPL ---- 01/19/23 - 07/19/23 APPROVAL LETTER ON ACCOUNT [id number]   The following noted by financial counseling office:  "Pt is approved for 100% CAFA on FPL 01/19/23 - 07/19/23. However, acct [id number] has been assigned to [employer] for [facility] eligibility review. Per [facility] financial policy, acct cannot be adjusted off to charity until [facility] eligibility process is completed, and acct has been returned to [facility]."    Patient is participating in a Managed Medicaid Plan:  no, self pay only  SDOH Screenings   Food Insecurity: Food Insecurity Present (11/03/2021)  Housing: Low Risk  (02/13/2023)  Transportation Needs: No Transportation Needs (02/13/2023)  Utilities: Not At Risk (02/13/2023)  Depression (PHQ2-9): Low Risk  (10/31/2018)  Financial Resource Strain: Medium Risk (02/13/2023)  Tobacco Use: Low Risk  (02/09/2023)    Octavio Graves, MSW, LCSW Clinical Social Worker II [facility] Heart/Vascular Care Navigation  [phone]- work cell phone (preferred) [phone]- desk phone

## 2023-03-03 ENCOUNTER — Ambulatory Visit: Payer: Self-pay | Admitting: Pulmonary Disease

## 2023-03-03 MED ORDER — OZEMPIC (0.25 OR 0.5 MG/DOSE) 2 MG/3ML ~~LOC~~ SOPN: PEN_INJECTOR | SUBCUTANEOUS | 11 refills | Status: DC

## 2023-03-03 MED ORDER — GLIPIZIDE 10 MG PO TABS: ORAL_TABLET | ORAL | 11 refills | Status: DC

## 2023-03-03 NOTE — Addendum Note (Signed)
Addended by: Marcene Duos on: 03/03/2023 03:03 PM   Modules accepted: Orders

## 2023-03-03 NOTE — Progress Notes (Addendum)
His sugars look great, but his weight is going the wrong way.  Please discuss working on diet and gradual increase of physical activity as breathing allows.  I am increasing his dose of Ozempic to 0.5 mg weekly Have him cut his glipizide to 5 mg  or 1/2 tab by moth twice daily.

## 2023-03-07 ENCOUNTER — Encounter: Payer: Self-pay | Admitting: Pulmonary Disease

## 2023-03-08 ENCOUNTER — Encounter: Payer: Self-pay | Admitting: Internal Medicine

## 2023-03-08 ENCOUNTER — Ambulatory Visit: Payer: Self-pay | Admitting: Internal Medicine

## 2023-03-08 VITALS — BP 136/80 | HR 85 | Resp 16 | Ht 71.0 in | Wt 336.0 lb

## 2023-03-08 DIAGNOSIS — G8929 Other chronic pain: Secondary | ICD-10-CM

## 2023-03-08 DIAGNOSIS — E1165 Type 2 diabetes mellitus with hyperglycemia: Secondary | ICD-10-CM

## 2023-03-08 DIAGNOSIS — R600 Localized edema: Secondary | ICD-10-CM | POA: Insufficient documentation

## 2023-03-08 DIAGNOSIS — I10 Essential (primary) hypertension: Secondary | ICD-10-CM

## 2023-03-08 DIAGNOSIS — M545 Low back pain, unspecified: Secondary | ICD-10-CM | POA: Insufficient documentation

## 2023-03-08 DIAGNOSIS — H25043 Posterior subcapsular polar age-related cataract, bilateral: Secondary | ICD-10-CM | POA: Insufficient documentation

## 2023-03-08 DIAGNOSIS — D860 Sarcoidosis of lung: Secondary | ICD-10-CM

## 2023-03-08 DIAGNOSIS — Z599 Problem related to housing and economic circumstances, unspecified: Secondary | ICD-10-CM

## 2023-03-08 DIAGNOSIS — E66813 Obesity, class 3: Secondary | ICD-10-CM

## 2023-03-08 DIAGNOSIS — N50811 Right testicular pain: Secondary | ICD-10-CM

## 2023-03-08 MED ORDER — FUROSEMIDE 20 MG PO TABS: ORAL_TABLET | ORAL | 3 refills | Status: DC

## 2023-03-08 NOTE — Addendum Note (Signed)
Addended by: Marcene Duos on: 03/08/2023 05:52 PM   Modules accepted: Orders

## 2023-03-08 NOTE — Progress Notes (Addendum)
Subjective:    Patient ID: Roy Andrews, male   DOB: 03/28/1971, 52 y.o.   MRN: [id number]   HPI  Tereasa Coop interprets   Right epididymitis:  resolved with course of Bactrim.  He continues to have a bit of right low back pain when bends over and stands back up.  2.  Bilateral thoracic back pain:  Starts in low thoracic area bilaterally and radiates up to high thoracic back.  He states he more so gets fatigue with exertion rather than dyspnea, but he does have dyspnea as well. He has been started on Earlie Server for pulmonary sarcoidosis He did also recently see Dr. Rosemary Holms with cardiology.  Normal EF at 60-65% with echo in July. Myocardial perfusion stress testing showed no ecg changes nor reversible ischemia.   Missed his appt for sarcoid with Dr. Wynona Neat, pulmonology on the 25th.   Discussed need to call ahead and reschedule appts in future.   Generally, feels his back pain, fatigue and dyspnea are better, but still needs improvement.   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   He is using Methocarbamol twice daily regularly instead of as needed.  He is trying to stave off any muscle pain instead of just using as needed.   Does not feel the gabapentin has helped much with pain--he is taking 300 mg twice daily.  Never started midday dose.  States he generally is outside and not eating, so does not take the med.    3. Financial Assistance:  his wife was working with Tressie Ellis SW to get his bill rectified.  We were performing the same work.  Encouraged him in future to share that he has already started the process and give the contact to our SW group.    4.  Was seen by Dr. Cherlynn Polo office, optometry in [place] and told he needs a referral to a specialist--not clear what the issue is .States he needs a surgery  5.  DM/Morbid obesity:  sugars last week were quite good last week, but weight up when in about 1 week ago.  He is only  taking 1 tab of Metformin twice daily.  He states he did not recognize   We did cut his glipizide to 5 mg twice daily. He also increased Semaglutide last Friday to 0.5 mg weekly.   He is down 4 lbs from 7 days ago, previously was trending back up.  6.  HM:  No interest in COVID vaccine today..  7.  Hyperlipidemia:  LDL fine, but HDL and Trigs not at goal Lipid Panel     Component Value Date/Time   CHOL 118 01/27/2023 0850   TRIG 151 (H) 01/27/2023 0850   HDL 27 (L) 01/27/2023 0850   LDLCALC 65 01/27/2023 0850   LABVLDL 26 01/27/2023 0850     8.  Peripheral edema:  when on feet a lot as he was yesterday, does get swelling of lower legs.  L>>R  Current Meds  Medication Sig   AgaMatrix Ultra-Thin Lancets MISC Check blood glucose twice daily before meals   aspirin EC 81 MG tablet Take 1 tablet (81 mg total) by mouth daily. Swallow whole.   atorvastatin (LIPITOR) 40 MG tablet Take 1 tablet (40 mg total) by mouth daily.   blood glucose meter kit and supplies Dispense based on patient and insurance preference. Use up to four times daily as directed. (FOR ICD-10 E10.9, E11.9).  Blood Glucose Monitoring Suppl (AGAMATRIX PRESTO) w/Device KIT Check blood glucose twice daily before meals   empagliflozin (JARDIANCE) 10 MG TABS tablet Take 1 tablet (10 mg total) by mouth daily before breakfast.   fluticasone furoate-vilanterol (BREO ELLIPTA) 100-25 MCG/ACT AEPB Inhale 1 puff into the lungs daily.   gabapentin (NEURONTIN) 100 MG capsule 3 caps by mouth 3 times daily (Patient taking differently: 2 caps by mouth 2 times daily)   glipiZIDE (GLUCOTROL) 10 MG tablet 1/2 tab by mouth twice daily with meal   glucose blood (AGAMATRIX PRESTO TEST) test strip Check blood glucose twice daily before meals.   metFORMIN (GLUCOPHAGE-XR) 500 MG 24 hr tablet 2 tabs by mouth twice daily with meals (Patient taking differently: 1 tabs by mouth twice daily with meals)   methocarbamol (ROBAXIN) 500 MG tablet Take 1  tablet (500 mg total) by mouth every 6 (six) hours as needed for muscle spasms. (Patient taking differently: Take 500 mg by mouth every 6 (six) hours as needed for muscle spasms. Patient reports that he takes it twice daily as preventative medication.)   Semaglutide,0.25 or 0.5MG /DOS, (OZEMPIC, 0.25 OR 0.5 MG/DOSE,) 2 MG/3ML SOPN 0.5 mg injected subcutaneously once weekly   No Known Allergies   Review of Systems    Objective:   BP 136/80 (BP Location: Left Arm, Patient Position: Sitting, Cuff Size: Normal)   Pulse 85   Resp 16   Ht 5\' 11"  (1.803 m)   Wt (!) 336 lb (152.4 kg)   BMI 46.86 kg/m   Physical Exam Morbidly obese Lungs:  CTA CV:  RRR without murmur or rub.  Radial and DP pulses normal and equal Abd:  large pannus.  + BS LE:  tight edema of left and much milder on right LE.  + pitting.  MS:  NT on palpation of entire back and at right low back in particular.   Neuro: Gait normal    Assessment & Plan   Dyspnea and fatigue and posterior thoracic back pain with exertion:  Cardiac work up negative.  He does have pulmonary sarcoidosis.  Needs to reschedule with Pulmonary.  Continue Breo Ellipta.    2.  Back pain:  both entire thoracic back and Right low back/right lower abdominal pain with bending over and standing:  PT referral.  3.  Financial Assistance:  he apparently has [employer] through March of 2025 and his very large bill is no longer owed.    4.  DM/obesity:  weaning Glipizide and increasing Ozempic.  Return in 2 weeks with blood glucose documentation and weight check.  He is to stop glipizide if his sugars are routinely dropping below 70 fasting.   He is to take Metformin as written at 1000 mg twice daily and not 500 mg.    5.  Hypertension:  controlled  6.  Hyperlipidemia:  not active, so not surprised with HDL.  Encouraged gradual increase in physical activity.  7.  Eye concerns:  need to find his eye evaluation or call Dr. Annett Fabian office to  obtain.  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 Dione Booze.  Will likely need to refer to [facility] as above.  Also a comment about unspecified papilledema, but no plans to refer for that.  Unable to call today as office now closed.    8.  LE edema:  Watch sodium  intake.  Furosemide 20 mg in morning on days when has swelling.  Recline and elevated legs when sitting.   Knee high compression stockings.  9.  Epididymitis:  resolved

## 2023-03-23 ENCOUNTER — Telehealth: Payer: Self-pay

## 2023-03-23 DIAGNOSIS — H25043 Posterior subcapsular polar age-related cataract, bilateral: Secondary | ICD-10-CM

## 2023-03-23 NOTE — Telephone Encounter (Signed)
I received an email, from [facility]) stating that patient needed to be referred to a retina specialist. [facility] referred patient to [facility] . Today I received an email again from [place] stating that [place] [facility] let her know that they do not do cataracts there. She informed me and asked if we can refer appropriately to [facility].

## 2023-03-27 NOTE — Telephone Encounter (Signed)
Called Dr. Elicia Lamp and [facility] to get a better idea if there is an urgent need to be seen by ophthalmology with the unspecified papilledema.   Otherwise sending to [facility] ophthalmology for cataracts.

## 2023-03-28 ENCOUNTER — Other Ambulatory Visit: Payer: Self-pay

## 2023-03-29 NOTE — Telephone Encounter (Signed)
Patients has been informed of appointment and all OV notes have been faxed.

## 2023-04-04 ENCOUNTER — Other Ambulatory Visit: Payer: Self-pay

## 2023-04-04 NOTE — Progress Notes (Unsigned)
Patient has been taking .5mg  ozempic for about 2 weeks.

## 2023-04-04 NOTE — Progress Notes (Unsigned)
Case Management   Client name: Roy Andrews Therapist name: Letta Moynahan Darreld Mclean, Theresia Majors Date: 02/14/2023 Time: 60 mins.  Client and his wife are experiencing financial hardships due to hospital bills, rent, low-income, and undocumented status, which has created challenges to access resources .    MSW intern met with both and created a worksheet with resources that meets their current needs.  Sherilyn Cooter, MSW Intern

## 2023-04-04 NOTE — Progress Notes (Unsigned)
Case Management Note   Client name: [facility] Therapist name: Letta Moynahan Darreld Mclean, [place] Date: 10/016/2024 Time: 60 mins.  Client and wife are experiencing financial hardship due to hospital bills, rent, low income, undocumented status which has created challenges to access resources. MSW intern continued to assist them with providing agencies that dealt with their specific needs. MSW intern connected them to [employer] for food and rent. Intern gave the family the contact information of an immigration lawyer to reach out to. Client is receiving assistance from a Child psychotherapist at [employer].  Sherilyn Cooter, MSW

## 2023-04-20 ENCOUNTER — Other Ambulatory Visit: Payer: Self-pay | Admitting: Internal Medicine

## 2023-06-09 ENCOUNTER — Other Ambulatory Visit: Payer: Self-pay

## 2023-06-16 ENCOUNTER — Other Ambulatory Visit: Payer: Self-pay

## 2023-06-16 DIAGNOSIS — E1165 Type 2 diabetes mellitus with hyperglycemia: Secondary | ICD-10-CM

## 2023-06-17 LAB — HEMOGLOBIN A1C
Est. average glucose Bld gHb Est-mCnc: 148 mg/dL
Hgb A1c MFr Bld: 6.8 % — ABNORMAL HIGH (ref 4.8–5.6)

## 2023-06-26 IMAGING — MR MR TOES*R* WO/W CM
10 series · 40 of 40 positions shown · IV contrast (gadavist)
Comparison: Radiographs, same date.

CLINICAL DATA: Right foot pain and swelling. No known injury.

EXAM:
MRI OF THE RIGHT TOES WITHOUT AND WITH CONTRAST
TECHNIQUE: Multiplanar, multisequence MR imaging of the right foot was
performed both before and after administration of intravenous
contrast.
CONTRAST:  10mL GADAVIST GADOBUTROL 1 MMOL/ML IV SOLN

[Series 5: T2 fat-sat · coronal · 3.0mm · 0.47mm/px · 5 of 56 slices shown (1 of 2)]
[im 1/56]
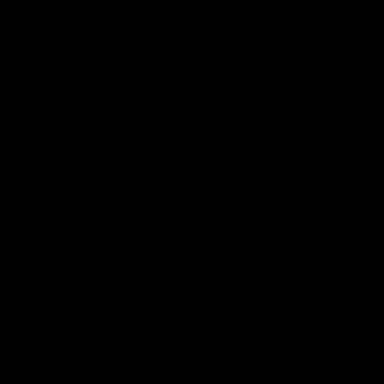
[im 14/56]
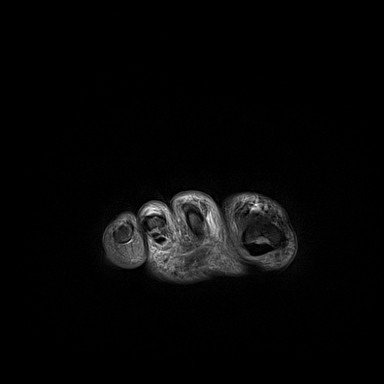
[im 28/56]
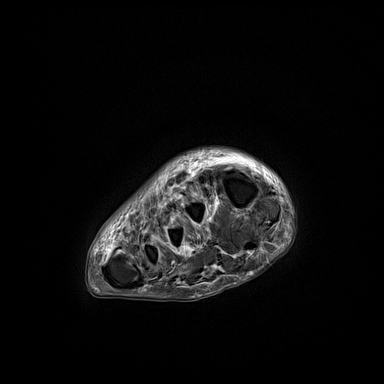
[im 42/56]
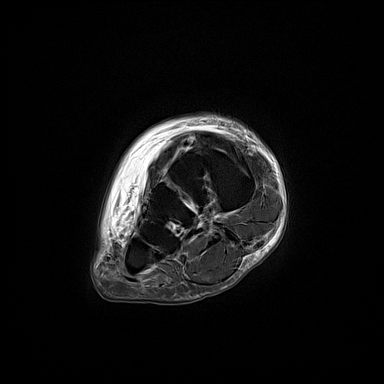
[im 56/56]
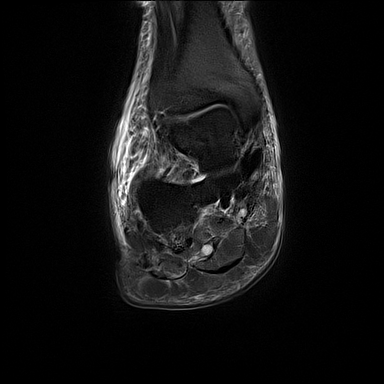

[Series 6: T1 · coronal · 3.0mm · 0.56mm/px · 5 of 56 slices shown (1 of 2)]
[im 1/56]
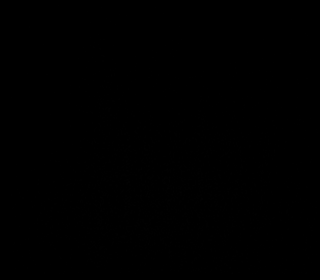
[im 14/56]
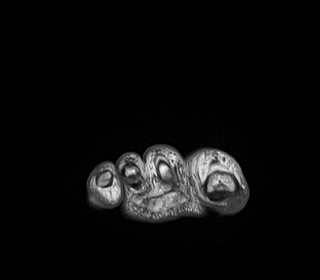
[im 28/56]
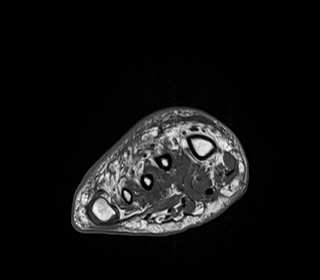
[im 42/56]
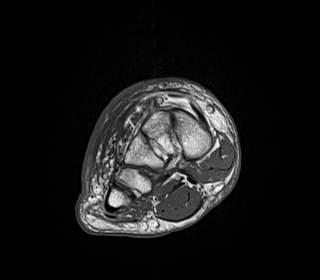
[im 56/56]
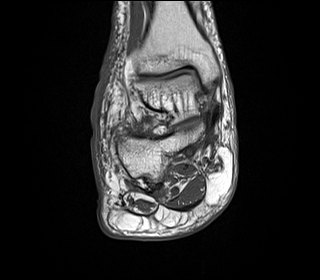

[Series 7: T1 · axial · 3.0mm · 0.57mm/px · z∈[-107,+48]mm · 3 of 40 slices shown (2 of 2)]
[im 1/40]
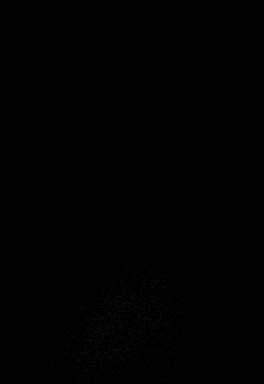
[im 20/40]
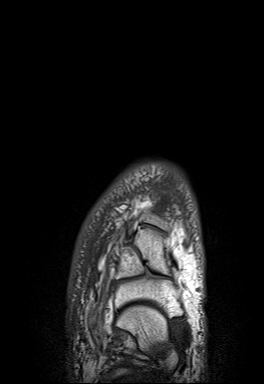
[im 40/40]
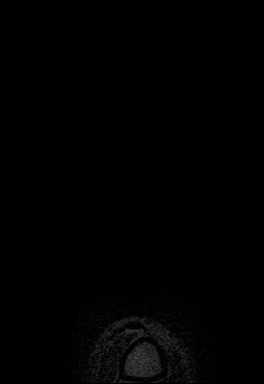

[Series 8: T2 fat-sat · axial · 3.0mm · 0.60mm/px · z∈[-108,+47]mm · 3 of 40 slices shown (2 of 2)]
[im 1/40]
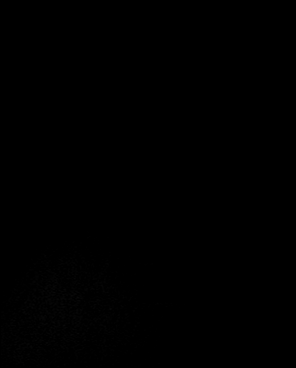
[im 20/40]
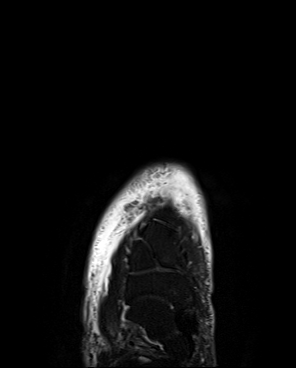
[im 40/40]
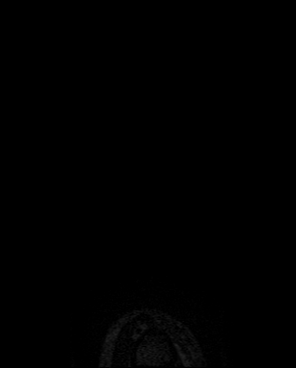

[Series 10: T1 fat-sat · coronal · non-contrast · 3.0mm · 0.59mm/px · 5 of 60 slices shown (1 of 3)]
[im 1/60]
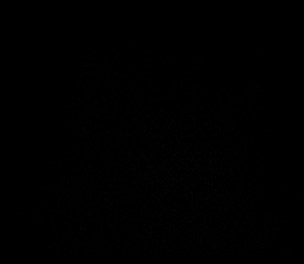
[im 15/60]
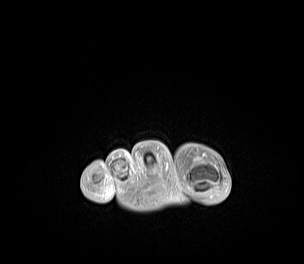
[im 30/60]
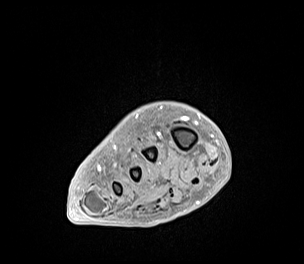
[im 45/60]
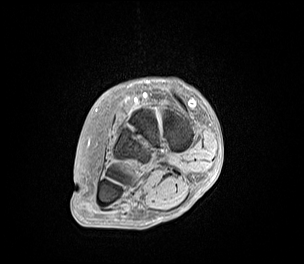
[im 60/60]
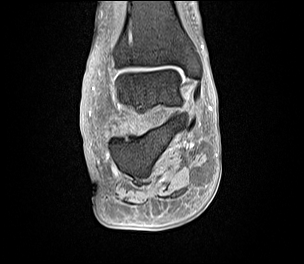

[Series 11: STIR · sagittal · 3.0mm · 0.86mm/px · 3 of 37 slices shown]
[im 1/37]
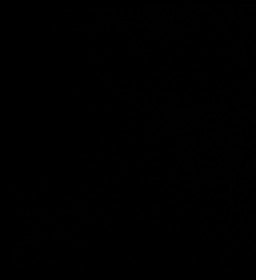
[im 19/37]
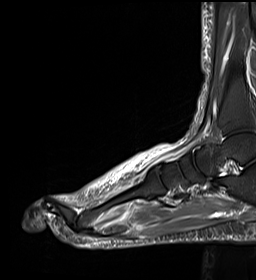
[im 37/37]
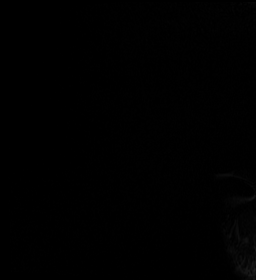

[Series 14: T1 fat-sat post-contrast · coronal · 3.0mm · 0.59mm/px · 5 of 60 slices shown (1 of 2)]
[im 1/60]
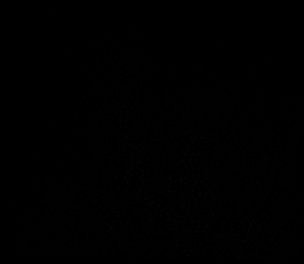
[im 15/60]
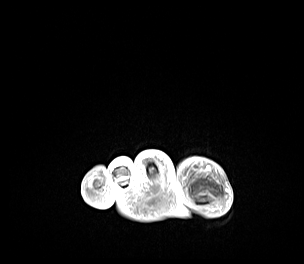
[im 30/60]
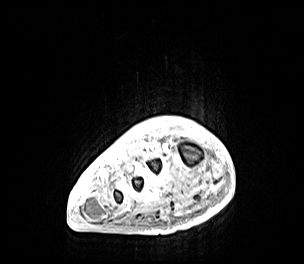
[im 45/60]
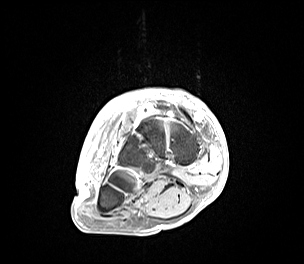
[im 60/60]
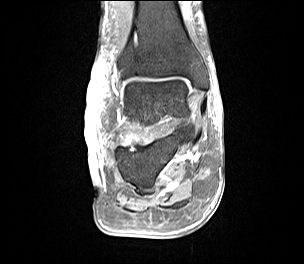

[Series 15: T1 fat-sat · axial · 3.0mm · 0.69mm/px · z∈[-130,+25]mm · 3 of 40 slices shown (2 of 3)]
[im 1/40]
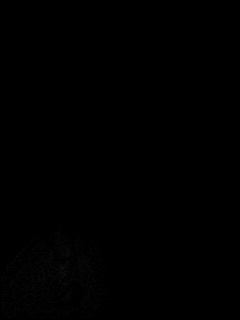
[im 20/40]
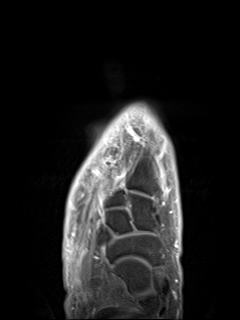
[im 40/40]
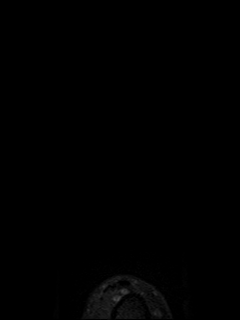

[Series 16: T1 fat-sat · sagittal · 3.0mm · 0.72mm/px · 3 of 38 slices shown (3 of 3)]
[im 1/38]
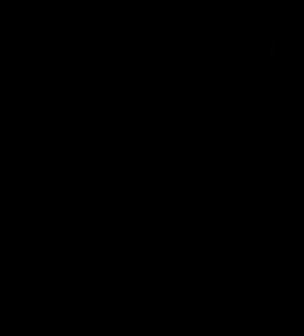
[im 19/38]
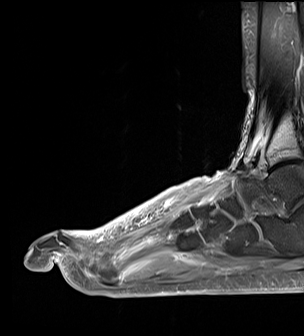
[im 38/38]
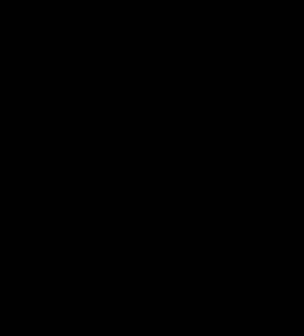

[Series 17: T1 fat-sat post-contrast · coronal · 3.0mm · 0.62mm/px · 5 of 60 slices shown (2 of 2)]
[im 1/60]
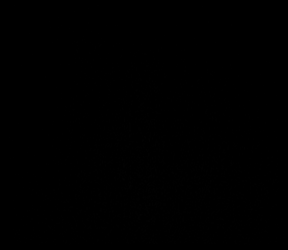
[im 15/60]
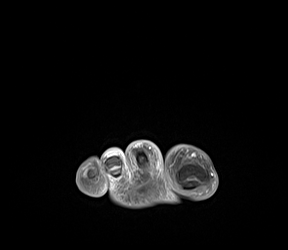
[im 30/60]
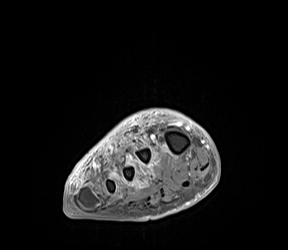
[im 45/60]
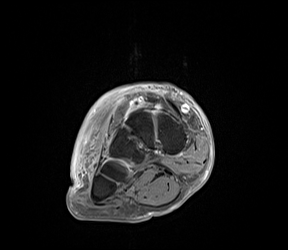
[im 60/60]
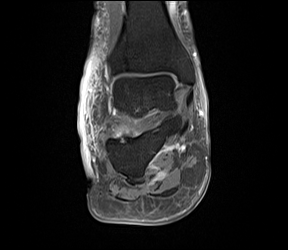

[40 of 40 positions shown; findings below may reference images not displayed]

FINDINGS: Diffuse subcutaneous soft tissue swelling/edema mainly over the
dorsum of the forefoot and midfoot and over the lateral aspect of
the ankle. There is a somewhat irregular 2 cm ill-defined fluid
collection noted in the subcutaneous overlying the region of the
third and fourth metatarsal heads. No obvious rim enhancement after
contrast administration but this could be a developing abscess.

Moderate myositis noted surrounding the second, third and fourth
metatarsals but no findings for pyomyositis. No MR findings to
suggest septic arthritis or osteomyelitis.
IMPRESSION: 1. Diffuse subcutaneous soft tissue swelling/edema consistent with
cellulitis.
2. Ill-defined 2 cm fluid collection in the subcutaneous fat
overlying the the third and fourth metatarsal heads likely a
developing abscess.
3. No MR findings to suggest septic arthritis or osteomyelitis.
4. Moderate myositis surrounding the second, third and fourth
metatarsals but no findings for pyomyositis.

## 2023-07-11 ENCOUNTER — Ambulatory Visit: Payer: Self-pay | Admitting: Internal Medicine

## 2023-07-11 ENCOUNTER — Encounter: Payer: Self-pay | Admitting: Internal Medicine

## 2023-07-11 VITALS — BP 138/90 | HR 83 | Resp 18 | Ht 71.0 in | Wt 335.5 lb

## 2023-07-11 DIAGNOSIS — E1169 Type 2 diabetes mellitus with other specified complication: Secondary | ICD-10-CM

## 2023-07-11 DIAGNOSIS — D860 Sarcoidosis of lung: Secondary | ICD-10-CM

## 2023-07-11 DIAGNOSIS — I10 Essential (primary) hypertension: Secondary | ICD-10-CM

## 2023-07-11 DIAGNOSIS — E1165 Type 2 diabetes mellitus with hyperglycemia: Secondary | ICD-10-CM

## 2023-07-11 MED ORDER — SEMAGLUTIDE (1 MG/DOSE) 4 MG/3ML ~~LOC~~ SOPN: 1.0000 mg | PEN_INJECTOR | SUBCUTANEOUS | 11 refills | Status: AC

## 2023-07-11 MED ORDER — FUROSEMIDE 20 MG PO TABS: ORAL_TABLET | ORAL | 3 refills | Status: AC

## 2023-07-11 MED ORDER — ATORVASTATIN CALCIUM 40 MG PO TABS: 40.0000 mg | ORAL_TABLET | Freq: Every day | ORAL | 11 refills | Status: AC

## 2023-07-11 NOTE — Progress Notes (Signed)
 Subjective:    Patient ID: Roy Andrews, male   DOB: [birth year], 53 y.o.   MRN: [id number]   HPI  DM/obesity:   A1C in February remains at goal at 6.8%.  He is taking 500 mg Metformin ER twice daily and 5 mg of Glipizide twice daily.  He has not lost much weight with current dose of Ozempic.  States he is not very physically active during colder months.  He could walk with his wife, but she is also physically inactive as well.    2.  Elevated BP:  he is not sure why up today.  Not being treated for Htn.     3.  Hyperlipidemia:  not clear why, but he thought he was to hold the Atorvastatin.  I have him as taking in October with labs just prior not at goal due to low HDL and slightly elevated Trigs.    4.  Peripheral edema:  never obtained Furosemide as he felt the edema of his LE resolved.    5.  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  Feels his breathing is now back to a level where he could work, though perhaps not at baseline.  Continues on Breo ellipta  Current Meds  Medication Sig   aspirin EC 81 MG tablet Take 1 tablet (81 mg total) by mouth daily. Swallow whole.   empagliflozin (JARDIANCE) 10 MG TABS tablet Take 1 tablet (10 mg total) by mouth daily before breakfast.   fluticasone furoate-vilanterol (BREO ELLIPTA) 100-25 MCG/ACT AEPB INHALE 1 PUFF INTO THE LUNGS DAILY.   gabapentin (NEURONTIN) 100 MG capsule 3 caps by mouth 3 times daily (Patient taking differently: 2 caps by mouth 2 times daily)   glipiZIDE (GLUCOTROL) 10 MG tablet 1/2 tab by mouth twice daily with meal   metFORMIN (GLUCOPHAGE-XR) 500 MG 24 hr tablet 2 tabs by mouth twice daily with meals (Patient taking differently: 1 tabs by mouth twice daily with meals)   methocarbamol (ROBAXIN) 500 MG tablet Take 1 tablet (500 mg total) by mouth every 6 (six) hours as needed for muscle spasms.   Semaglutide,0.25 or 0.5MG /DOS, (OZEMPIC, 0.25 OR 0.5 MG/DOSE,) 2 MG/3ML SOPN 0.5 mg  injected subcutaneously once weekly    No Known Allergies   Review of Systems    Objective:   BP (!) 138/90 (BP Location: Left Arm, Patient Position: Sitting, Cuff Size: Normal)   Pulse 83   Resp 18   Ht 5\' 11"  (1.803 m)   Wt (!) 335 lb 8 oz (152.2 kg)   SpO2 97%   BMI 46.79 kg/m   Physical Exam Obese, NAD Color is much better--no longer appears so chronically ill Lungs:  CTA CV:  RRR without murmur or rub.  Radial and DP pulses normal and equal  LE:  Moderate pitting edema to upper pretib area bilaterally.  Violaceous skin in high ankle area.  Assessment & Plan  DM/obesity:  to call if sugars low and will stop glipizide.      2.  Elevated BP:  Recheck BP 2 weeks to be certain he is fine for cataract surgery.    3.  Hyperlipidemia:  get back on atorvastatin. Recheck labs wit A1C in 3 months.  4.  Peripheral edema:  He does have Rx for Furosemide as needed.  Ames Walker info to order graduated compression stockings.    5.  Cataracts and concern for papilledema:  Appears he only had cataracts and no  papilledema.  IOP were fine as well.  Patient supports that was what he was told.

## 2023-07-11 NOTE — Patient Instructions (Addendum)
 Metas para exercisios!!!  Tome un vaso de agua antes de cada comida Tome un minimo de 6 a 8 vasos de agua diarios Coma tres veces al dia Coma una proteina y Neomia Dear grasa saludable con comida.  (huevos, pescado, pollo, pavo, y limite carnes rojas Coma 5 porciones diarias de legumbres.  Mezcle los colores Coma 2 porciones diarias de frutas con cascara cuando sea comestible Use platos pequeos Suelte su tenedor o cuchara despues de cada mordida hata que se mastique y se trague Come en la mesa con amigos o familiares por lo menos una vez al dia Apague la televisin y aparatos electrnicos durante la comida  Su objetivo debe ser perder una libra por semana  Estudios recientes indican que las personas quienes consumen todos de sus calorias durante 12 horas se bajan de pesocon Mas eficiencia.  Por ejemplo, si Usted come su primera comida a las 7:00 a.m., su comida final del dia se debe completar antes de las 7:00 p.m.

## 2023-08-02 ENCOUNTER — Ambulatory Visit (INDEPENDENT_AMBULATORY_CARE_PROVIDER_SITE_OTHER): Payer: Self-pay | Admitting: Internal Medicine

## 2023-08-02 VITALS — BP 110/78 | HR 78

## 2023-08-02 DIAGNOSIS — Z0130 Encounter for examination of blood pressure without abnormal findings: Secondary | ICD-10-CM

## 2023-08-03 NOTE — Progress Notes (Signed)
 Patient bo is good Reported to pcp  No changes in meds

## 2023-11-01 ENCOUNTER — Other Ambulatory Visit: Payer: Self-pay | Admitting: Internal Medicine

## 2023-11-16 ENCOUNTER — Other Ambulatory Visit: Payer: Self-pay

## 2023-11-17 ENCOUNTER — Other Ambulatory Visit: Payer: Self-pay | Admitting: Internal Medicine

## 2023-11-20 ENCOUNTER — Ambulatory Visit: Payer: Self-pay | Admitting: Internal Medicine

## 2023-12-08 ENCOUNTER — Other Ambulatory Visit: Payer: Self-pay

## 2023-12-12 ENCOUNTER — Ambulatory Visit: Payer: Self-pay | Admitting: Internal Medicine

## 2024-01-31 ENCOUNTER — Telehealth: Payer: Self-pay | Admitting: Internal Medicine

## 2024-01-31 ENCOUNTER — Other Ambulatory Visit: Payer: Self-pay

## 2024-01-31 MED ORDER — METFORMIN HCL ER 500 MG PO TB24: ORAL_TABLET | ORAL | 11 refills | Status: AC

## 2024-01-31 NOTE — Telephone Encounter (Signed)
 Patient is requesting medication refills , patient states he went to the pharmacy to request refills and was told prescription expired last month  Medication is;  metFORMIN  (GLUCOPHAGE -XR) 500 MG 24 hr tablet [[id number]]    Patient states he has not taken medication for the past two days as he ran out, but states he is currently on the injection doctor prescribed to him and states that helps him a lot.   Notified patient he is to call us  at least a week before he runs out to avoid missing medication.

## 2024-01-31 NOTE — Telephone Encounter (Signed)
 Rx sent to [facility]

## 2024-02-02 NOTE — Telephone Encounter (Signed)
 Patient has been notified

## 2024-02-23 ENCOUNTER — Other Ambulatory Visit: Payer: Self-pay

## 2024-02-27 ENCOUNTER — Telehealth: Payer: Self-pay | Admitting: Internal Medicine

## 2024-02-27 NOTE — Telephone Encounter (Signed)
 Patient is scheduled for a lab appointment on 03/15/2024.  Patient rescheduled an appointment for after lab , patient will be on wait list after labs are done

## 2024-02-29 ENCOUNTER — Ambulatory Visit: Payer: Self-pay | Admitting: Internal Medicine

## 2024-03-15 ENCOUNTER — Other Ambulatory Visit: Payer: Self-pay

## 2024-03-22 ENCOUNTER — Other Ambulatory Visit: Payer: Self-pay

## 2024-03-27 ENCOUNTER — Other Ambulatory Visit: Payer: Self-pay | Admitting: Internal Medicine

## 2024-05-06 ENCOUNTER — Telehealth: Payer: Self-pay | Admitting: Internal Medicine

## 2024-05-06 NOTE — Telephone Encounter (Signed)
 Patient called and requested a refill for medication  glipiZIDE    To be sent to pharmacy:  [facility] [address] ([address] [place] - [address] ([place]) [place] [address] Phone: [phone]  Fax: [phone]

## 2024-05-07 NOTE — Telephone Encounter (Signed)
 I received a call from [facility] , they said that the patient called them today asking for gabapentin  refill.  Because the patient had not taken the medication since July, they wanted Dr. Adella to confirm that they can give it to the patient

## 2024-05-07 NOTE — Telephone Encounter (Signed)
 Patient has been scheduled for 07/30/2023. Patient is aware he needs to attend appointment to continue with medication refills.

## 2024-05-10 ENCOUNTER — Other Ambulatory Visit: Payer: Self-pay | Admitting: Internal Medicine

## 2024-05-16 MED ORDER — GLIPIZIDE 10 MG PO TABS: ORAL_TABLET | ORAL | 11 refills | Status: AC

## 2024-05-16 MED ORDER — GABAPENTIN 300 MG PO CAPS: 300.0000 mg | ORAL_CAPSULE | Freq: Two times a day (BID) | ORAL | 2 refills | Status: AC

## 2024-05-16 NOTE — Telephone Encounter (Signed)
 Patient called and states he obtained orange card today and went to pick up gabapentin  [facility] but was told they are unable to give him refill until doctor confirms refill.   Per CMA asked patient why he has not been taking medication since July? Patient states he has been taking medication all the time until he ran out in December. Patient states he was given 2 bottles of medication and  he was suppose to take 3 pills, 3X a day but instead he was just taking two times a day  3 pills in the morning and 3 pills at night, patient states he had medication left to last longer but he ran out completely in December . Patient has not been taking medication since December.  Patient states he needs the medication because he is starting to have the symptoms again, symptoms are tingling on his feet and states this medication is suppose to help him with this.   Patient also states he was only taking med twice a day because he is already taking too much medication and he does not want to put a lot of meds in his body.

## 2024-05-16 NOTE — Addendum Note (Signed)
 Addended by: MONETTA SPRAGUE R on: 05/16/2024 04:54 PM   Modules accepted: Orders

## 2024-05-16 NOTE — Telephone Encounter (Signed)
 Please notify the patient glipizide  is sent to [facility]

## 2024-05-17 ENCOUNTER — Other Ambulatory Visit: Payer: Self-pay

## 2024-05-17 NOTE — Telephone Encounter (Signed)
 Patient will wait for upcoming appointment in march

## 2024-05-20 NOTE — Telephone Encounter (Signed)
 Gabapentin  sent to pharmacy, talked to the pharmacy , the patient can pick up now

## 2024-07-19 ENCOUNTER — Other Ambulatory Visit: Payer: Self-pay

## 2024-07-29 ENCOUNTER — Ambulatory Visit: Payer: Self-pay | Admitting: Internal Medicine
